# Patient Record
Sex: Female | Born: 1960
Health system: Southern US, Community
[De-identification: ages and names within clinical notes are randomized; demographics above are authoritative.]

## PROBLEM LIST (undated history)

## (undated) DIAGNOSIS — K219 Gastro-esophageal reflux disease without esophagitis: Secondary | ICD-10-CM

## (undated) DIAGNOSIS — K449 Diaphragmatic hernia without obstruction or gangrene: Secondary | ICD-10-CM

## (undated) DIAGNOSIS — K5732 Diverticulitis of large intestine without perforation or abscess without bleeding: Secondary | ICD-10-CM

## (undated) DIAGNOSIS — F329 Major depressive disorder, single episode, unspecified: Secondary | ICD-10-CM

## (undated) DIAGNOSIS — K222 Esophageal obstruction: Secondary | ICD-10-CM

## (undated) DIAGNOSIS — J309 Allergic rhinitis, unspecified: Secondary | ICD-10-CM

## (undated) DIAGNOSIS — R112 Nausea with vomiting, unspecified: Secondary | ICD-10-CM

## (undated) DIAGNOSIS — J0100 Acute maxillary sinusitis, unspecified: Secondary | ICD-10-CM

## (undated) DIAGNOSIS — F32A Depression, unspecified: Secondary | ICD-10-CM

## (undated) DIAGNOSIS — Z9889 Other specified postprocedural states: Secondary | ICD-10-CM

## (undated) DIAGNOSIS — D1803 Hemangioma of intra-abdominal structures: Secondary | ICD-10-CM

## (undated) DIAGNOSIS — K5792 Diverticulitis of intestine, part unspecified, without perforation or abscess without bleeding: Secondary | ICD-10-CM

## (undated) DIAGNOSIS — F419 Anxiety disorder, unspecified: Secondary | ICD-10-CM

## (undated) DIAGNOSIS — K5790 Diverticulosis of intestine, part unspecified, without perforation or abscess without bleeding: Secondary | ICD-10-CM

## (undated) DIAGNOSIS — T7840XA Allergy, unspecified, initial encounter: Secondary | ICD-10-CM

## (undated) DIAGNOSIS — K589 Irritable bowel syndrome without diarrhea: Secondary | ICD-10-CM

## (undated) DIAGNOSIS — H01009 Unspecified blepharitis unspecified eye, unspecified eyelid: Secondary | ICD-10-CM

## (undated) HISTORY — DX: Diaphragmatic hernia without obstruction or gangrene: K44.9

## (undated) HISTORY — DX: Major depressive disorder, single episode, unspecified: F32.9

## (undated) HISTORY — PX: ROBOT ASSISTED LAPAROSCOPIC PARTIAL COLECTOMY: SHX6476

## (undated) HISTORY — DX: Hemangioma of intra-abdominal structures: D18.03

## (undated) HISTORY — DX: Gastro-esophageal reflux disease without esophagitis: K21.9

## (undated) HISTORY — DX: Allergic rhinitis, unspecified: J30.9

## (undated) HISTORY — DX: Acute maxillary sinusitis, unspecified: J01.00

## (undated) HISTORY — DX: Irritable bowel syndrome, unspecified: K58.9

## (undated) HISTORY — DX: Diverticulitis of large intestine without perforation or abscess without bleeding: K57.32

## (undated) HISTORY — DX: Anxiety disorder, unspecified: F41.9

## (undated) HISTORY — DX: Allergy, unspecified, initial encounter: T78.40XA

## (undated) HISTORY — DX: Depression, unspecified: F32.A

## (undated) HISTORY — PX: UPPER GASTROINTESTINAL ENDOSCOPY: SHX188

## (undated) HISTORY — DX: Diverticulitis of intestine, part unspecified, without perforation or abscess without bleeding: K57.92

## (undated) HISTORY — DX: Diverticulosis of intestine, part unspecified, without perforation or abscess without bleeding: K57.90

## (undated) HISTORY — PX: OTHER SURGICAL HISTORY: SHX169

## (undated) HISTORY — DX: Esophageal obstruction: K22.2

## (undated) HISTORY — PX: COLONOSCOPY: SHX174

## (undated) HISTORY — PX: TONSILLECTOMY: SUR1361

## (undated) HISTORY — DX: Unspecified blepharitis unspecified eye, unspecified eyelid: H01.009

## (undated) HISTORY — PX: NASAL SINUS SURGERY: SHX719

---

## 2001-03-08 ENCOUNTER — Encounter (INDEPENDENT_AMBULATORY_CARE_PROVIDER_SITE_OTHER): Payer: Self-pay | Admitting: Specialist

## 2001-03-08 ENCOUNTER — Other Ambulatory Visit: Admission: RE | Admit: 2001-03-08 | Discharge: 2001-03-08 | Payer: Self-pay | Admitting: Gastroenterology

## 2001-12-10 ENCOUNTER — Encounter: Payer: Self-pay | Admitting: Internal Medicine

## 2001-12-10 ENCOUNTER — Ambulatory Visit (HOSPITAL_COMMUNITY): Admission: RE | Admit: 2001-12-10 | Discharge: 2001-12-10 | Payer: Self-pay | Admitting: Internal Medicine

## 2003-03-23 ENCOUNTER — Encounter: Payer: Self-pay | Admitting: Obstetrics and Gynecology

## 2003-03-23 ENCOUNTER — Ambulatory Visit (HOSPITAL_COMMUNITY): Admission: RE | Admit: 2003-03-23 | Discharge: 2003-03-23 | Payer: Self-pay | Admitting: Obstetrics and Gynecology

## 2004-02-12 ENCOUNTER — Encounter: Admission: RE | Admit: 2004-02-12 | Discharge: 2004-02-12 | Payer: Self-pay | Admitting: Family Medicine

## 2004-04-07 ENCOUNTER — Encounter: Admission: RE | Admit: 2004-04-07 | Discharge: 2004-04-07 | Payer: Self-pay | Admitting: Gastroenterology

## 2004-04-13 ENCOUNTER — Ambulatory Visit (HOSPITAL_COMMUNITY): Admission: RE | Admit: 2004-04-13 | Discharge: 2004-04-13 | Payer: Self-pay | Admitting: Gastroenterology

## 2004-04-13 ENCOUNTER — Encounter: Payer: Self-pay | Admitting: Internal Medicine

## 2004-04-19 ENCOUNTER — Encounter (INDEPENDENT_AMBULATORY_CARE_PROVIDER_SITE_OTHER): Payer: Self-pay | Admitting: Gastroenterology

## 2004-04-26 ENCOUNTER — Ambulatory Visit (HOSPITAL_COMMUNITY): Admission: RE | Admit: 2004-04-26 | Discharge: 2004-04-26 | Payer: Self-pay | Admitting: Gastroenterology

## 2004-07-28 ENCOUNTER — Ambulatory Visit: Payer: Self-pay | Admitting: Internal Medicine

## 2004-09-02 ENCOUNTER — Ambulatory Visit: Payer: Self-pay | Admitting: Internal Medicine

## 2005-05-22 ENCOUNTER — Ambulatory Visit: Payer: Self-pay | Admitting: Gastroenterology

## 2005-09-14 ENCOUNTER — Ambulatory Visit: Payer: Self-pay | Admitting: Internal Medicine

## 2005-09-19 ENCOUNTER — Ambulatory Visit: Payer: Self-pay | Admitting: Internal Medicine

## 2005-10-05 ENCOUNTER — Ambulatory Visit: Payer: Self-pay | Admitting: Internal Medicine

## 2005-11-07 ENCOUNTER — Encounter: Payer: Self-pay | Admitting: Internal Medicine

## 2005-11-10 IMAGING — CT CT ABDOMEN W/ CM
1 of 2 series · 15 of 32 positions shown, 19 images · IV contrast (gastro & omnipaque 100)
Comparison: none

CLINICAL DATA: Follow-up abdominal mass, [HOSPITAL] study abdominal CT with contrast, [DATE].  
ABDOMINAL CT WITH CONTRAST

[Series 2: routine abdomen · axial · 0.70mm/px · z∈[-394,-49]mm · 15 of 77 slices shown, 19 images]
[im 4/77  soft-tissue]
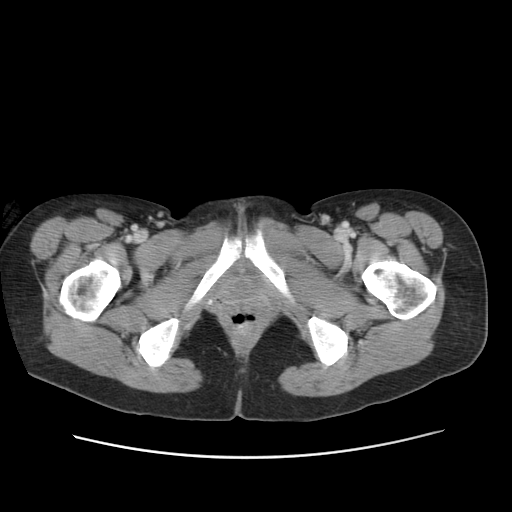
[im 4/77  bone]
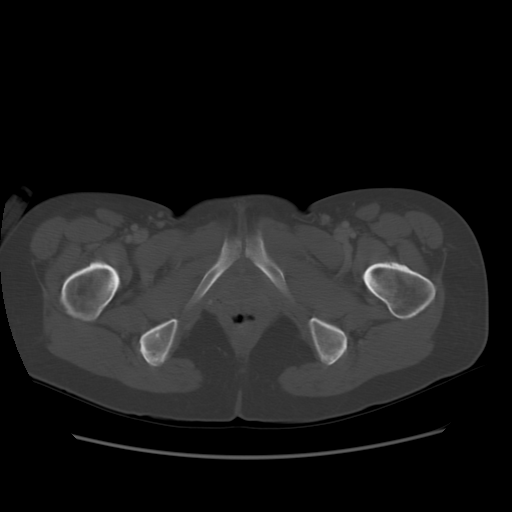
[im 11/77  soft-tissue]
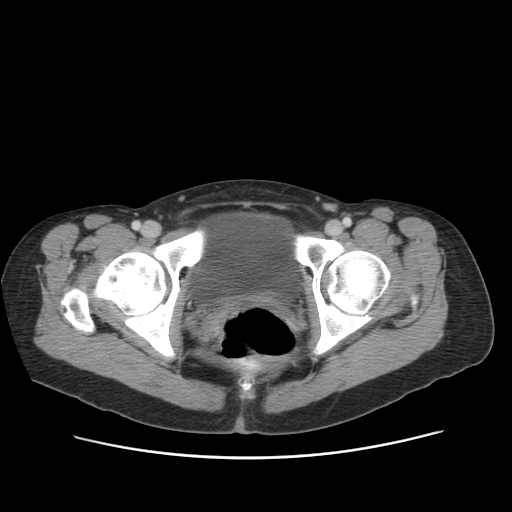
[im 18/77  soft-tissue]
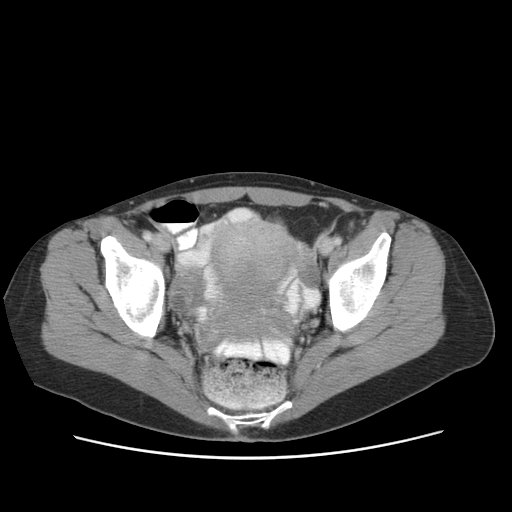
[im 21/77  soft-tissue]
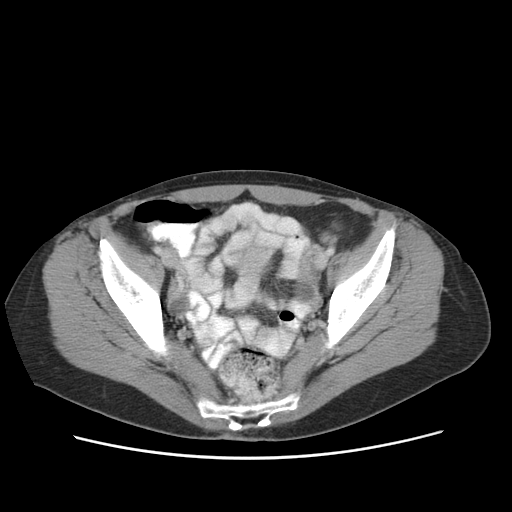
[im 28/77  soft-tissue]
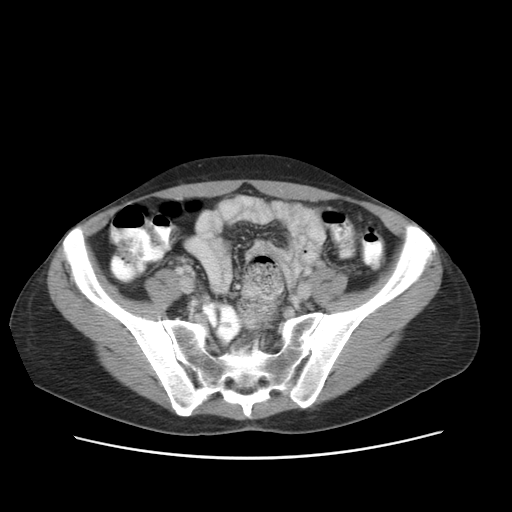
[im 32/77  soft-tissue]
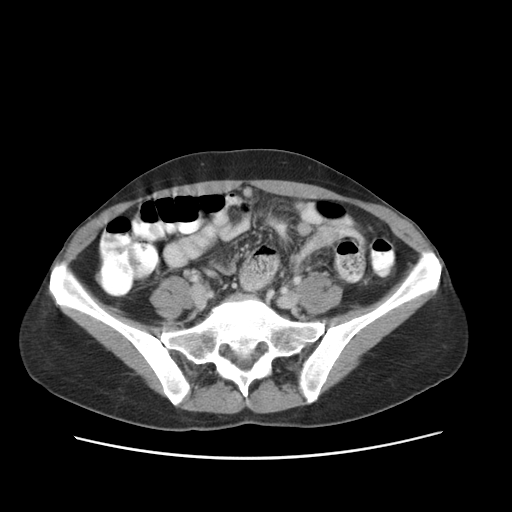
[im 39/77  soft-tissue]
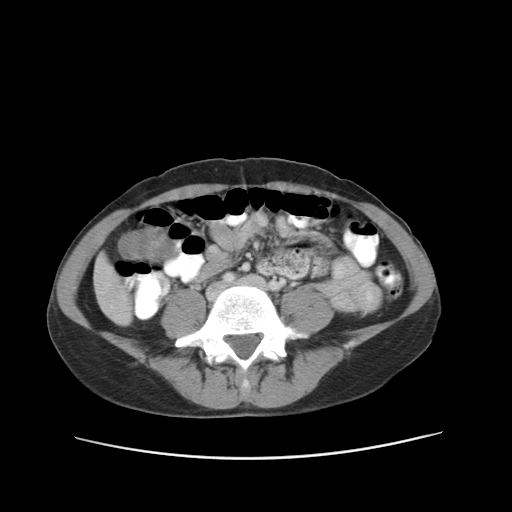
[im 45/77  soft-tissue]
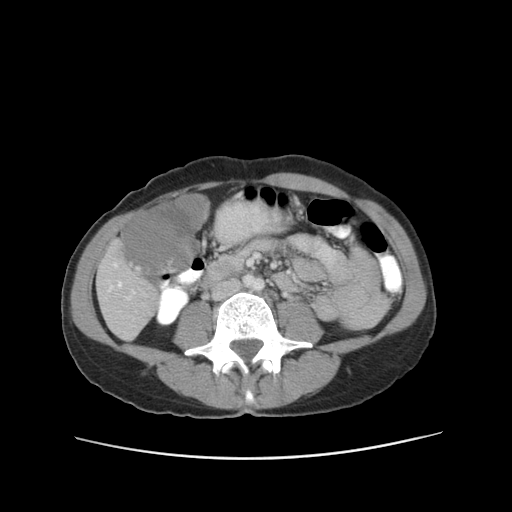
[im 49/77  soft-tissue]
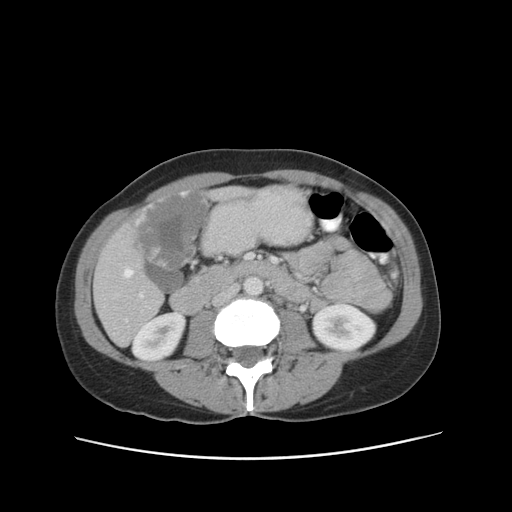
[im 49/77  bone]
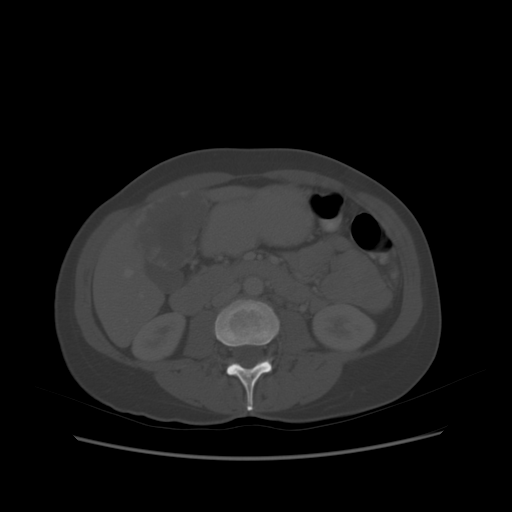
[im 56/77  soft-tissue]
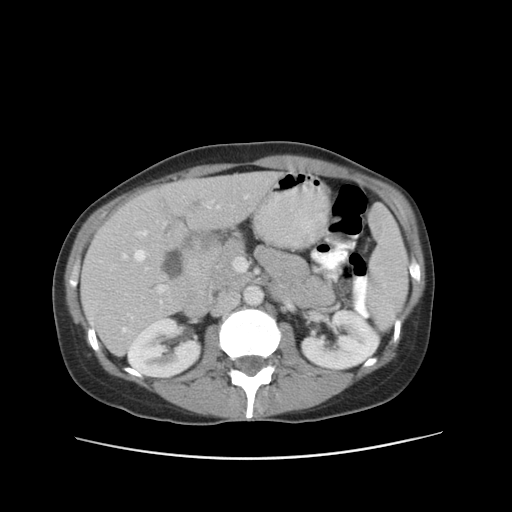
[im 59/77  soft-tissue]
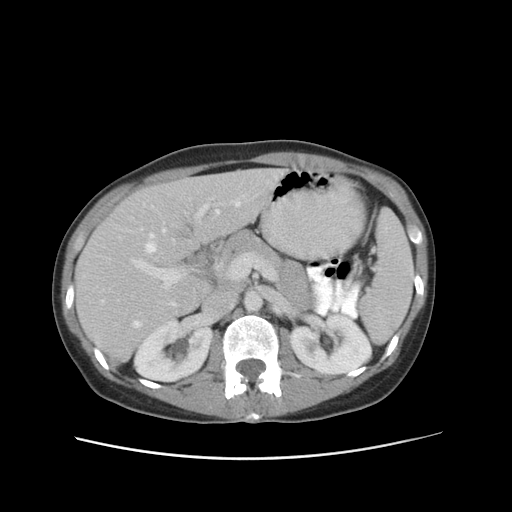
[im 63/77  lung]
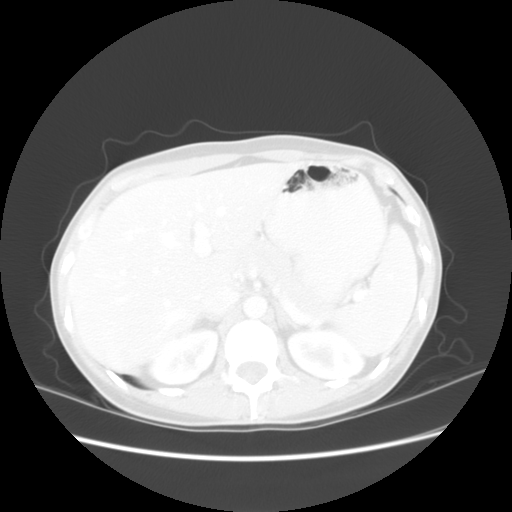
[im 66/77  soft-tissue]
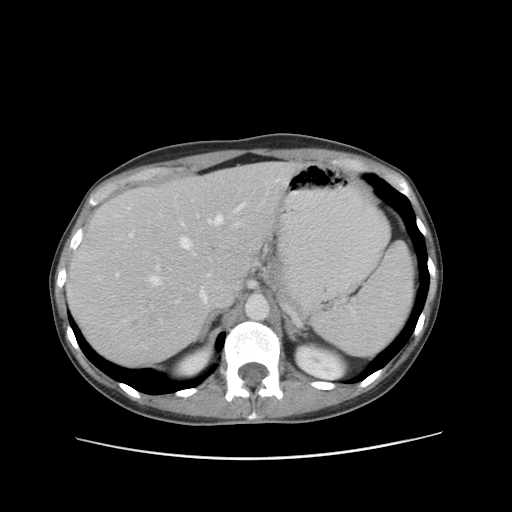
[im 66/77  lung]
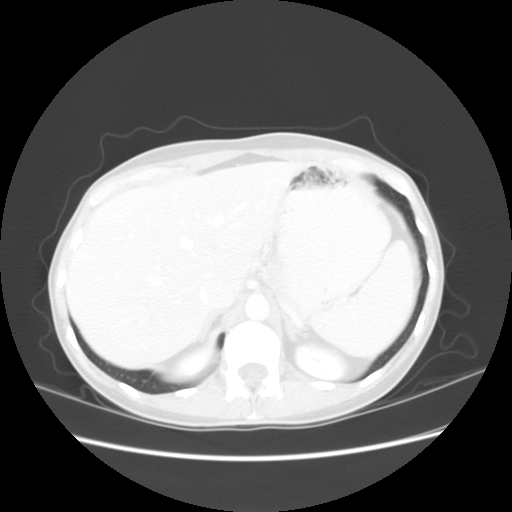
[im 70/77  lung]
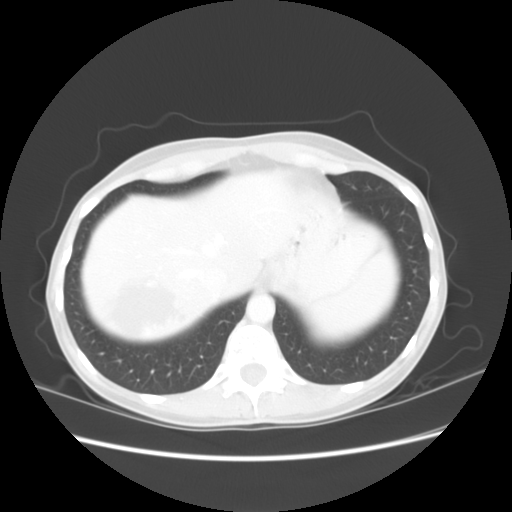
[im 73/77  soft-tissue]
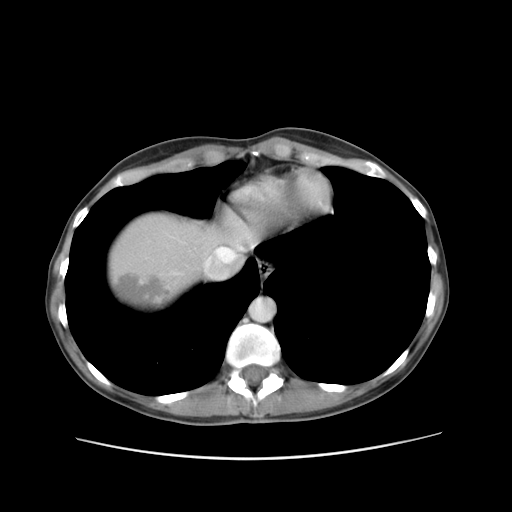
[im 73/77  lung]
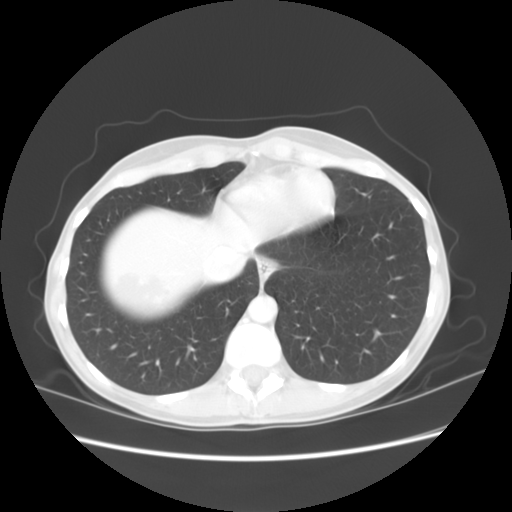

[15 of 32 positions shown; findings below may reference images not displayed]

FINDINGS: Previous [place] abdominal ultrasound is unavailable for comparison at this time.  When available an addendum report will be issued.  4 cm long x 3.6 cm AP x 4.8 cm wide dome of liver and inferomedial segment left lobe 9 cm long x 4.8 cm AP x 7.1 cm wide (image 31) and superior left lobe 6 mm and 6 mm more inferior mid probable right lobe (image 16) low density foci are seen on dynamic and delayed intravenous contrast enhanced images.  he two larger lesions show progressive peripheral vascular enhancement with the more superior left lobe 6 mm focus appearing isodense favoring three liver hemangiomata. The smaller 6 mm focus on delayed views (image 18) persists favoring incidental cyst although indeterminate due to small size at this time.  The liver is otherwise unremarkable.  Bases of the lungs are clear.  The remaining abdominal organs appear normal without inflammation nor adenopathy.
IMPRESSION
1.  Four hepatic lesions as described.  Three of which favor hemangiomata.  Follow-up with liver ultrasound or CT is advised in six months or liver MRI for further confirmation.
2.  Probable tiny right hepatic cyst ? follow-up ultrasound or CT also would be confirmatory.
3.  Otherwise negative.
PELVIC CT WITH CONTRAST
FINDINGS: Routine spiral CT of the pelvis was performed. Omnipaque 300 intravenous contrast and oral contrast were administered. 

The pelvic structures are normal in appearance. There is no evidence of masses, adenopathy, inflammatory process, or abnormal fluid collections.   Small bilateral ovarian cysts are seen with the largest at the left measuring up to 2.2 cm (image 56). 

IMPRESSION
1.    Bilateral ovarian cysts as described.
2.     Otherwise negative.

## 2005-12-09 ENCOUNTER — Ambulatory Visit: Payer: Self-pay | Admitting: Internal Medicine

## 2006-02-06 ENCOUNTER — Ambulatory Visit: Payer: Self-pay | Admitting: Internal Medicine

## 2006-08-15 ENCOUNTER — Ambulatory Visit: Payer: Self-pay | Admitting: Gastroenterology

## 2006-08-15 LAB — CONVERTED CEMR LAB
ALT: 15 units/L (ref 0–40)
AST: 22 units/L (ref 0–37)
Albumin: 4.5 g/dL (ref 3.5–5.2)
Alkaline Phosphatase: 43 units/L (ref 39–117)
Amylase: 164 units/L — ABNORMAL HIGH (ref 27–131)
BUN: 10 mg/dL (ref 6–23)
Basophils Absolute: 0 10*3/uL (ref 0.0–0.1)
Basophils Relative: 0.7 % (ref 0.0–1.0)
CO2: 29 meq/L (ref 19–32)
Calcium: 9.8 mg/dL (ref 8.4–10.5)
Chloride: 103 meq/L (ref 96–112)
Creatinine, Ser: 0.8 mg/dL (ref 0.4–1.2)
Eosinophils Absolute: 0.1 10*3/uL (ref 0.0–0.6)
Eosinophils Relative: 1.5 % (ref 0.0–5.0)
GFR calc Af Amer: 100 mL/min
GFR calc non Af Amer: 82 mL/min
Glucose, Bld: 91 mg/dL (ref 70–99)
HCT: 38.8 % (ref 36.0–46.0)
Hemoglobin: 13.7 g/dL (ref 12.0–15.0)
Lipase: 39 units/L (ref 11.0–59.0)
Lymphocytes Relative: 19.4 % (ref 12.0–46.0)
MCHC: 35.3 g/dL (ref 30.0–36.0)
MCV: 92.9 fL (ref 78.0–100.0)
Monocytes Absolute: 0.5 10*3/uL (ref 0.2–0.7)
Monocytes Relative: 7.4 % (ref 3.0–11.0)
Neutro Abs: 5.1 10*3/uL (ref 1.4–7.7)
Neutrophils Relative %: 71 % (ref 43.0–77.0)
Platelets: 215 10*3/uL (ref 150–400)
Potassium: 4.3 meq/L (ref 3.5–5.1)
RBC: 4.18 M/uL (ref 3.87–5.11)
RDW: 11.6 % (ref 11.5–14.6)
Sed Rate: 11 mm/hr (ref 0–25)
Sodium: 137 meq/L (ref 135–145)
Total Bilirubin: 0.8 mg/dL (ref 0.3–1.2)
Total Protein: 7.4 g/dL (ref 6.0–8.3)
WBC: 7.1 10*3/uL (ref 4.5–10.5)

## 2006-08-17 ENCOUNTER — Ambulatory Visit (HOSPITAL_COMMUNITY): Admission: RE | Admit: 2006-08-17 | Discharge: 2006-08-17 | Payer: Self-pay | Admitting: Gastroenterology

## 2006-08-20 ENCOUNTER — Ambulatory Visit (HOSPITAL_COMMUNITY): Admission: RE | Admit: 2006-08-20 | Discharge: 2006-08-20 | Payer: Self-pay | Admitting: Gastroenterology

## 2006-08-23 ENCOUNTER — Ambulatory Visit: Payer: Self-pay | Admitting: Gastroenterology

## 2006-08-24 ENCOUNTER — Ambulatory Visit (HOSPITAL_COMMUNITY): Admission: RE | Admit: 2006-08-24 | Discharge: 2006-08-24 | Payer: Self-pay | Admitting: Gastroenterology

## 2006-09-11 ENCOUNTER — Ambulatory Visit: Payer: Self-pay | Admitting: Internal Medicine

## 2006-09-20 ENCOUNTER — Ambulatory Visit: Payer: Self-pay | Admitting: Internal Medicine

## 2006-09-28 ENCOUNTER — Ambulatory Visit: Payer: Self-pay | Admitting: Internal Medicine

## 2006-10-03 ENCOUNTER — Ambulatory Visit: Payer: Self-pay | Admitting: Gastroenterology

## 2006-11-12 ENCOUNTER — Ambulatory Visit: Payer: Self-pay | Admitting: Internal Medicine

## 2006-11-12 LAB — CONVERTED CEMR LAB: Creatinine, Ser: 0.7 mg/dL (ref 0.4–1.2)

## 2006-11-20 ENCOUNTER — Encounter: Payer: Self-pay | Admitting: Internal Medicine

## 2007-05-27 ENCOUNTER — Ambulatory Visit: Payer: Self-pay | Admitting: Internal Medicine

## 2007-05-27 DIAGNOSIS — J019 Acute sinusitis, unspecified: Secondary | ICD-10-CM | POA: Insufficient documentation

## 2007-05-27 DIAGNOSIS — Z87442 Personal history of urinary calculi: Secondary | ICD-10-CM

## 2007-05-27 DIAGNOSIS — H01009 Unspecified blepharitis unspecified eye, unspecified eyelid: Secondary | ICD-10-CM | POA: Insufficient documentation

## 2007-05-27 DIAGNOSIS — K449 Diaphragmatic hernia without obstruction or gangrene: Secondary | ICD-10-CM | POA: Insufficient documentation

## 2007-05-27 DIAGNOSIS — J309 Allergic rhinitis, unspecified: Secondary | ICD-10-CM

## 2007-05-27 DIAGNOSIS — K219 Gastro-esophageal reflux disease without esophagitis: Secondary | ICD-10-CM | POA: Insufficient documentation

## 2007-05-27 HISTORY — DX: Unspecified blepharitis unspecified eye, unspecified eyelid: H01.009

## 2007-05-30 ENCOUNTER — Telehealth: Payer: Self-pay | Admitting: Internal Medicine

## 2007-07-04 ENCOUNTER — Telehealth: Payer: Self-pay | Admitting: Internal Medicine

## 2007-07-31 ENCOUNTER — Telehealth: Payer: Self-pay | Admitting: Internal Medicine

## 2007-08-26 ENCOUNTER — Telehealth: Payer: Self-pay | Admitting: Internal Medicine

## 2007-09-27 ENCOUNTER — Ambulatory Visit: Payer: Self-pay | Admitting: Internal Medicine

## 2008-01-08 ENCOUNTER — Ambulatory Visit: Payer: Self-pay | Admitting: Internal Medicine

## 2008-01-08 DIAGNOSIS — L255 Unspecified contact dermatitis due to plants, except food: Secondary | ICD-10-CM

## 2008-02-06 ENCOUNTER — Encounter: Payer: Self-pay | Admitting: Internal Medicine

## 2008-02-10 ENCOUNTER — Telehealth (INDEPENDENT_AMBULATORY_CARE_PROVIDER_SITE_OTHER): Payer: Self-pay | Admitting: Gastroenterology

## 2008-02-17 ENCOUNTER — Encounter: Payer: Self-pay | Admitting: Internal Medicine

## 2008-05-18 ENCOUNTER — Telehealth: Payer: Self-pay | Admitting: *Deleted

## 2008-08-11 ENCOUNTER — Telehealth: Payer: Self-pay | Admitting: *Deleted

## 2008-09-08 ENCOUNTER — Ambulatory Visit: Payer: Self-pay | Admitting: Internal Medicine

## 2008-11-11 ENCOUNTER — Encounter: Payer: Self-pay | Admitting: Internal Medicine

## 2008-11-30 ENCOUNTER — Telehealth: Payer: Self-pay | Admitting: *Deleted

## 2008-12-22 ENCOUNTER — Ambulatory Visit: Payer: Self-pay | Admitting: Internal Medicine

## 2008-12-22 DIAGNOSIS — D1803 Hemangioma of intra-abdominal structures: Secondary | ICD-10-CM

## 2008-12-22 DIAGNOSIS — F4321 Adjustment disorder with depressed mood: Secondary | ICD-10-CM

## 2008-12-22 DIAGNOSIS — G479 Sleep disorder, unspecified: Secondary | ICD-10-CM | POA: Insufficient documentation

## 2009-01-01 ENCOUNTER — Ambulatory Visit: Payer: Self-pay | Admitting: Internal Medicine

## 2009-02-26 ENCOUNTER — Telehealth: Payer: Self-pay | Admitting: *Deleted

## 2009-09-03 ENCOUNTER — Telehealth: Payer: Self-pay | Admitting: Internal Medicine

## 2009-11-15 ENCOUNTER — Telehealth: Payer: Self-pay | Admitting: Internal Medicine

## 2009-11-15 ENCOUNTER — Ambulatory Visit: Payer: Self-pay | Admitting: Internal Medicine

## 2009-11-15 DIAGNOSIS — K222 Esophageal obstruction: Secondary | ICD-10-CM

## 2009-11-15 HISTORY — DX: Esophageal obstruction: K22.2

## 2009-11-16 LAB — CONVERTED CEMR LAB
Basophils Absolute: 0 10*3/uL (ref 0.0–0.1)
Basophils Relative: 0.2 % (ref 0.0–3.0)
Eosinophils Absolute: 0 10*3/uL (ref 0.0–0.7)
Eosinophils Relative: 0.5 % (ref 0.0–5.0)
HCT: 36.3 % (ref 36.0–46.0)
Hemoglobin: 12.5 g/dL (ref 12.0–15.0)
Lymphocytes Relative: 10.9 % — ABNORMAL LOW (ref 12.0–46.0)
Lymphs Abs: 1.1 10*3/uL (ref 0.7–4.0)
MCHC: 34.5 g/dL (ref 30.0–36.0)
MCV: 92.5 fL (ref 78.0–100.0)
Monocytes Absolute: 0.7 10*3/uL (ref 0.1–1.0)
Monocytes Relative: 7.6 % (ref 3.0–12.0)
Neutro Abs: 7.9 10*3/uL — ABNORMAL HIGH (ref 1.4–7.7)
Neutrophils Relative %: 80.8 % — ABNORMAL HIGH (ref 43.0–77.0)
Platelets: 183 10*3/uL (ref 150.0–400.0)
RBC: 3.92 M/uL (ref 3.87–5.11)
RDW: 13.8 % (ref 11.5–14.6)
WBC: 9.8 10*3/uL (ref 4.5–10.5)

## 2009-11-17 ENCOUNTER — Telehealth: Payer: Self-pay | Admitting: Physician Assistant

## 2009-12-13 ENCOUNTER — Encounter: Payer: Self-pay | Admitting: Internal Medicine

## 2010-01-13 ENCOUNTER — Inpatient Hospital Stay (HOSPITAL_COMMUNITY): Admission: EM | Admit: 2010-01-13 | Discharge: 2010-01-14 | Payer: Self-pay | Admitting: Emergency Medicine

## 2010-01-13 ENCOUNTER — Telehealth: Payer: Self-pay | Admitting: Internal Medicine

## 2010-01-17 ENCOUNTER — Telehealth: Payer: Self-pay | Admitting: Internal Medicine

## 2010-01-20 ENCOUNTER — Encounter: Payer: Self-pay | Admitting: Internal Medicine

## 2010-01-27 ENCOUNTER — Ambulatory Visit: Payer: Self-pay | Admitting: Internal Medicine

## 2010-03-22 ENCOUNTER — Ambulatory Visit: Payer: Self-pay | Admitting: Internal Medicine

## 2010-03-22 LAB — CONVERTED CEMR LAB
ALT: 11 units/L (ref 0–35)
AST: 16 units/L (ref 0–37)
Albumin: 4.2 g/dL (ref 3.5–5.2)
Alkaline Phosphatase: 43 units/L (ref 39–117)
BUN: 13 mg/dL (ref 6–23)
Basophils Absolute: 0 10*3/uL (ref 0.0–0.1)
Basophils Relative: 0.3 % (ref 0.0–3.0)
Bilirubin Urine: NEGATIVE
Bilirubin, Direct: 0.1 mg/dL (ref 0.0–0.3)
CO2: 29 meq/L (ref 19–32)
Calcium: 9 mg/dL (ref 8.4–10.5)
Chloride: 105 meq/L (ref 96–112)
Cholesterol: 167 mg/dL (ref 0–200)
Creatinine, Ser: 0.8 mg/dL (ref 0.4–1.2)
Eosinophils Absolute: 0.1 10*3/uL (ref 0.0–0.7)
Eosinophils Relative: 2.1 % (ref 0.0–5.0)
GFR calc non Af Amer: 85.78 mL/min (ref >60)
Glucose, Bld: 76 mg/dL (ref 70–99)
Glucose, Urine, Semiquant: NEGATIVE
HCT: 36 % (ref 36.0–46.0)
HDL: 48.6 mg/dL (ref >39.00)
Hemoglobin: 12.4 g/dL (ref 12.0–15.0)
Ketones, urine, test strip: NEGATIVE
LDL Cholesterol: 96 mg/dL (ref 0–99)
Lymphocytes Relative: 24.5 % (ref 12.0–46.0)
Lymphs Abs: 1.2 10*3/uL (ref 0.7–4.0)
MCHC: 34.6 g/dL (ref 30.0–36.0)
MCV: 95.2 fL (ref 78.0–100.0)
Monocytes Absolute: 0.3 10*3/uL (ref 0.1–1.0)
Monocytes Relative: 6.5 % (ref 3.0–12.0)
Neutro Abs: 3.1 10*3/uL (ref 1.4–7.7)
Neutrophils Relative %: 66.6 % (ref 43.0–77.0)
Nitrite: NEGATIVE
Platelets: 193 10*3/uL (ref 150.0–400.0)
Potassium: 4.7 meq/L (ref 3.5–5.1)
Protein, U semiquant: NEGATIVE
RBC: 3.78 M/uL — ABNORMAL LOW (ref 3.87–5.11)
RDW: 14 % (ref 11.5–14.6)
Sodium: 140 meq/L (ref 135–145)
Specific Gravity, Urine: 1.025
TSH: 4.67 microintl units/mL (ref 0.35–5.50)
Total Bilirubin: 0.6 mg/dL (ref 0.3–1.2)
Total CHOL/HDL Ratio: 3
Total Protein: 6.2 g/dL (ref 6.0–8.3)
Triglycerides: 110 mg/dL (ref 0.0–149.0)
Urobilinogen, UA: 0.2
VLDL: 22 mg/dL (ref 0.0–40.0)
WBC Urine, dipstick: NEGATIVE
WBC: 4.7 10*3/uL (ref 4.5–10.5)
pH: 5.5

## 2010-04-08 ENCOUNTER — Telehealth: Payer: Self-pay | Admitting: Internal Medicine

## 2010-07-21 ENCOUNTER — Ambulatory Visit
Admission: RE | Admit: 2010-07-21 | Discharge: 2010-07-21 | Payer: Self-pay | Source: Home / Self Care | Attending: Internal Medicine | Admitting: Internal Medicine

## 2010-07-21 DIAGNOSIS — N63 Unspecified lump in unspecified breast: Secondary | ICD-10-CM | POA: Insufficient documentation

## 2010-07-26 ENCOUNTER — Encounter: Payer: Self-pay | Admitting: Internal Medicine

## 2010-07-29 ENCOUNTER — Telehealth: Payer: Self-pay | Admitting: *Deleted

## 2010-08-14 ENCOUNTER — Encounter: Payer: Self-pay | Admitting: Internal Medicine

## 2010-08-23 NOTE — Assessment & Plan Note (Signed)
Summary: LLQ pain/sheri    History of Present Illness Visit Type: Follow-up Visit Primary GI MD: Stan Head MD [facility] Primary Provider: Berniece Andreas, MD Chief Complaint: LLQ, severe at times for 3 days History of Present Illness:   50 YO FEMALE KNOWN TO DR. Leone Payor WITH HX OF RECURRENT DIVERTICULITIS. HER LAST COLONOSCOPY WAS DONE IN 6/10 AND SHOWS MODERATEDIVERTICULOSIS TRANSVERSE TO SIGMOID COLON -MORE INVOLVED IN DESCENDING AND SIGMOID . SHE HAS BEEN TREATED MULTIPLE TIMES FOR DIVERTICULITIS-HAS NOT REQUIRED HOSPITALIZATION. HAS HAD 4 DOCUMENTED EPISODES IN THE PAST YEAR AND SAYS SHE TREATED HERSELF WITH CIPRO AT LEAST ONE OTHER TIME. SHE COMES IN TODAY WITH 3 DAY HX OF SIGNIFICANT LLQ PAIN WITH SOME RADIATION INTO THE LOWER BACK AND INTO HER LEGS.. NO FEVER,+CHILLS,MILD NAUSEA. SHE FEELS WORSE WITH by mouth INTAKE. DID NOT SLEEP LAST NIGHT DUE TO PAIN. SHE IS HAVING NORMAL BM'S., NO HEME. SHE HAS NOT HAD A SURGICAL CONSULT THOUGH SURGERY HAS BEEN DISCUSSED.   GI Review of Systems    Reports abdominal pain and  bloating.     Location of  Abdominal pain: LLQ.    Denies acid reflux, belching, chest pain, dysphagia with liquids, dysphagia with solids, heartburn, loss of appetite, vomiting, vomiting blood, and  weight loss.      Reports diverticulosis.     Denies anal fissure, black tarry stools, change in bowel habit, constipation, diarrhea, fecal incontinence, heme positive stool, hemorrhoids, irritable bowel syndrome, jaundice, light color stool, liver problems, rectal bleeding, and  rectal pain.    Current Medications (verified): 1)  Wellbutrin Xl 150 Mg  Tb24 (Bupropion Hcl) .Marland Kitchen.. 1 By Mouth Once Daily 2)  Calcium 500/d 500-200 Mg-Unit Tabs (Calcium Carbonate-Vitamin D) .... Once Daily 3)  Alprazolam 0.25 Mg Tabs (Alprazolam) .Marland Kitchen.. 1 By Mouth At Night As Needed 4)  Sulfacetamide Sodium (Acne) 10 % Lotn (Sulfacetamide Sodium (Acne)) .... Apply To Affected Area Two Times A Day 5)   Multivitamins  Tabs (Multiple Vitamin) .... Once Daily 6)  Vitamin D 400 Unit Tabs (Cholecalciferol) .... Once Daily  Allergies (verified): 1)  ! Demerol 2)  ! Augmentin  Past History:  Past Medical History: Allergic rhinitis GERD hemangioma liver incidental finding -LARGE;RIGHT AND LEFT LOBES OF LIVER. Anxiety Disorder Depression Irritable Bowel Syndrome Diverticulosis/Diverticulitis-RECURRENT Hiatal Hernia  Past Surgical History: Reviewed history from 05/27/2007 and no changes required. Sinus/ Nasal Surgery Ulnar nerve rt arm Tonsillectomy Adnoidectomy Endoscopy w/ dilation  Family History: Reviewed history from 09/08/2008 and no changes required. son with autism  Lung cancer: Mother Family History of Colon Cancer:Maternal Uncle Family History of Heart Disease: Father  Social History: Reviewed history from 12/22/2008 and no changes required. Married Never Smoked   Occupation:Home  Alcohol Use - yes -occ. Daily Caffeine Use -3 Illicit Drug Use - no Patient gets regular exercise. husband works in [place] and commutes   Review of Systems       The patient complains of allergy/sinus, back pain, night sweats, and skin rash.  The patient denies anemia, anxiety-new, arthritis/joint pain, blood in urine, breast changes/lumps, change in vision, confusion, cough, coughing up blood, depression-new, fainting, fatigue, fever, headaches-new, hearing problems, heart murmur, heart rhythm changes, itching, menstrual pain, muscle pains/cramps, nosebleeds, pregnancy symptoms, shortness of breath, sleeping problems, sore throat, swelling of feet/legs, swollen lymph glands, thirst - excessive , urination - excessive , urination changes/pain, urine leakage, vision changes, and voice change.         ROS OTHERWISE AS IN HPI  Vital Signs:  Patient profile:   50 year old female Menstrual status:  irregular Height:      66 inches Weight:      112.13 pounds BMI:     18.16 Temp:     98.5  degrees F oral Pulse rate:   100 / minute Pulse rhythm:   regular BP sitting:   100 / 64  (left arm) Cuff size:   regular  Vitals Entered By: June McMurray CMA Duncan Dull) (November 15, 2009 2:01 PM)  Physical Exam  General:  Well developed, well nourished, no acute distress. Head:  Normocephalic and atraumatic. Eyes:  PERRLA, no icterus. Lungs:  Clear throughout to auscultation. Heart:  Regular rate and rhythm; no murmurs, rubs,  or bruits. Abdomen:  SOFT, QIUTE TENDER LLQ,NO REBOUND, BS+ Rectal:  NOT DONE Extremities:  No clubbing, cyanosis, edema or deformities noted. Neurologic:  Alert and  oriented x4;  grossly normal neurologically. Psych:  Alert and cooperative. Normal mood and affect.   Impression & Recommendations:  Problem # 1:  DIVERTICULITIS OF COLON, RECURRENT (ICD-562.11) Assessment Deteriorated 50 Y.O. FEMALE WITH RECURRENT DIVERTICULITIS.  CBC TODAY START CIPRO 500 MG TWICE DAILY X 14 DAYS DOES NOT TOLERATE FLAGYL-NAUSEA BENTYL 10 MG 2-3 X DAILY AS NEEDED OVER THE NEXT FEW DAYS VICODEN 5/500 Q 4-6 HOURS AS NEEDED FOR PAIN WILL SCHEDULE FOR SURGICAL  CONSULT  TO DISCUSS ELECTIVE SEGMENTAL COLECTOMY. PT ADVISED TO CALL FOR ANY WORSENING OF SXS. Orders: TLB-CBC Platelet - w/Differential ([id number])  Problem # 2:  FAMILY HX COLON CANCER (ICD-V16.0) Assessment: Comment Only UNCLE; PT HAD COLONOSCOPY IN 6/10-DUE FOR FOLLOW UP  IN 5 YEARS  Problem # 3:  HEMANGIOMA, HEPATIC (ICD-228.04) Assessment: Comment Only  Problem # 4:  Hx of ADJUSTMENT DISORDER WITH DEPRESSED MOOD (ICD-309.0) Assessment: Comment Only  Patient Instructions: 1)  We have sent electronically a perscription for Bentyl and Cipro to [facility] [address]. 2)  We have faxed a perscription for pain, Vicodin to your pharmacy. 3)  We have called [facility] to make you a surgical consultation appointment, Nov 26, 2009. Arrive at 10:00Am for a 10:20 Appt. You will see Dr. Star Age.  4)   Copy sent to : Berniece Andreas, MD 5)  The medication list was reviewed and reconciled.  All changed / newly prescribed medications were explained.  A complete medication list was provided to the patient / caregiver. Prescriptions: VICODIN 5-500 MG TABS (HYDROCODONE-ACETAMINOPHEN) Take 1 tab every 6-8 hours as needed for pain  #10 x 0   Entered by:   Lowry Ram NCMA   Authorized by:   Sammuel Cooper PA-c   Signed by:   Lowry Ram NCMA on 11/15/2009   Method used:   Printed then faxed to ...       [facility]  [address] [phone]* (retail)       [address]       [place], [place]  [address]       Ph: [phone]       Fax: [phone]   RxID:   [id number] BENTYL 10 MG CAPS (DICYCLOMINE HCL) Take 1 tab twice daily as needed for cramping and spasms  #20 x 0   Entered by:   Lowry Ram NCMA   Authorized by:   Sammuel Cooper PA-c   Signed by:   Lowry Ram NCMA on 11/15/2009   Method used:   Electronically to        [facility] [address] [phone]* (retail)  [address]       [place], [place]  [address]       Ph: [phone]       Fax: [phone]   RxID:   [id number] CIPRO 500 MG TABS (CIPROFLOXACIN HCL) Take 1 tab twice daily x 14 days  #28 x 0   Entered by:   Lowry Ram NCMA   Authorized by:   Sammuel Cooper PA-c   Signed by:   Lowry Ram NCMA on 11/15/2009   Method used:   Electronically to        [facility] [address] [address] [phone]* (retail)       [address]       [place], [place]  [address]       Ph: [phone]       Fax: [phone]   RxID:   [id number]

## 2010-08-23 NOTE — Progress Notes (Signed)
Summary: med concern   Phone Note Call from Patient Call back at Home Phone [phone] Call back at [phone]   Caller: Patient Call For: Dr. Leone Payor Reason for Call: Talk to Nurse Summary of Call: would like a rx for Cipro... [facility] [address]...  also would like to discuss Diverticulitis with someone before the weekend Initial call taken by: Vallarie Mare,  April 08, 2010 8:15 AM  Follow-up for Phone Call        Patient  not currently having any diverticulitis problems.  She has met with Dr Michaell Cowing about surgery, but her husband has been diagnosed with Prostate CA and is undergoing treatment.  She has had to postpone her surgery until January.  She is leaving town on [place] and is requesting a rx to take with her incase she has another flare.  She also wanted Dr Leone Payor to know that last week she had a flare and just finished the last rx of Cipro.  Please advise Follow-up by: Darcey Nora RN, CGRN,  April 08, 2010 8:59 AM  Additional Follow-up for Phone Call Additional follow up Details #1::        Cipro refilled she should still let us know if having problems that are not responding to cipro Additional Follow-up by: Iva Boop MD, Clementeen Graham,  April 08, 2010 10:19 AM    Additional Follow-up for Phone Call Additional follow up Details #2::    patient notified Follow-up by: Darcey Nora RN, CGRN,  April 08, 2010 10:35 AM  Prescriptions: CIPRO 500 MG TABS (CIPROFLOXACIN HCL) Take 1 tab twice daily x 14 days  #28 x 0   Entered and Authorized by:   Iva Boop MD, [facility]   Signed by:   Iva Boop MD, FACG on 04/08/2010   Method used:   Electronically to        [facility] [phone]* (retail)       [address]       [place], [place]  [address]       Ph: [phone]       Fax: [phone]   RxID:   [id number]

## 2010-08-23 NOTE — Letter (Signed)
Summary: [facility]   Imported By: Maryln Gottron 12/29/2009 13:55:26  _____________________________________________________________________  External Attachment:    Type:   Image     Comment:   External Document

## 2010-08-23 NOTE — Progress Notes (Signed)
Summary: triage   Phone Note Call from Patient Call back at Home Phone [phone]   Caller: Patient Call For: Dr. Leone Payor Reason for Call: Talk to Nurse Summary of Call: would like to be seen asap to review CT scan, discuss surgery pt is supposed to have next month for diverticulitis, and recent hospital over-night stay Initial call taken by: Vallarie Mare,  January 17, 2010 8:48 AM  Follow-up for Phone Call        Patient  was admitted last week with diverticulitis.  Since this admission her GERD symptoms have flared up.  Patient  hasn't been on a PPI.  Patient  advised to start on Priolosec OTC 1 30 minutes prior to the first meal of the day.  REV scheduled for 01/27/10 2:15 for a post hospital. Follow-up by: Darcey Nora RN, CGRN,  January 17, 2010 8:55 AM

## 2010-08-23 NOTE — Assessment & Plan Note (Signed)
Summary: post hospital diverticulitis/GERD/sheri    History of Present Illness Visit Type: Follow-up Visit Primary GI MD: Stan Head MD [facility] Primary Provider: Berniece Andreas, MD Chief Complaint: Hospital/ diverticulitis & GERD History of Present Illness:   Recently admitted for recurent diverticulitis in June. Is finishing antibiotics and pain is improving, bowel habtis normalzing. She is slowly finishing cipro and metronidazole. Some heartburn that has resolved. Some nausea with antibiotics.    GI Review of Systems    Reports acid reflux, bloating, and  nausea.      Denies abdominal pain, belching, chest pain, dysphagia with liquids, dysphagia with solids, heartburn, loss of appetite, vomiting, vomiting blood, weight loss, and  weight gain.      Reports diarrhea and  diverticulosis.     Denies anal fissure, black tarry stools, change in bowel habit, constipation, fecal incontinence, heme positive stool, hemorrhoids, irritable bowel syndrome, jaundice, light color stool, liver problems, rectal bleeding, and  rectal pain. S   Colonoscopy  Procedure date:  01/01/2009  Findings:         1) Moderate diverticulosis in the transverse to sigmoid (worst in distal descending and sigmoid)   2) Otherwise normal examination, excellent prep   Comments:      Repeat colonoscopy in 10 years.    CT Abdomen/Pelvis  Procedure date:  01/13/2010  Findings:        Wall thickening and inflammatory changes of the sigmoid colon most   consistent with sigmoid diverticulitis.  Neoplasm can have a   similar appearance.  Correlate clinically as for the need for   follow-up imaging or endoscopy.   Procedures Next Due Date:    Colonoscopy: 12/2018   Current Medications (verified): 1)  Wellbutrin Xl 150 Mg  Tb24 (Bupropion Hcl) .Marland Kitchen.. 1 By Mouth Once Daily 2)  Calcium 500/d 500-200 Mg-Unit Tabs (Calcium Carbonate-Vitamin D) .... Once Daily 3)  Alprazolam 0.25 Mg Tabs (Alprazolam) .Marland Kitchen.. 1 By  Mouth At Night As Needed 4)  Sulfacetamide Sodium (Acne) 10 % Lotn (Sulfacetamide Sodium (Acne)) .... Apply To Affected Area Two Times A Day 5)  Multivitamins  Tabs (Multiple Vitamin) .... Once Daily 6)  Vitamin D 400 Unit Tabs (Cholecalciferol) .... Once Daily 7)  Cipro 500 Mg Tabs (Ciprofloxacin Hcl) .... Take 1 Tab Twice Daily X 14 Days 8)  Bentyl 10 Mg Caps (Dicyclomine Hcl) .... Take 1 Tab Twice Daily As Needed For Cramping and Spasms 9)  Vicodin 5-500 Mg Tabs (Hydrocodone-Acetaminophen) .... Take 1 Tab Every 6-8 Hours As Needed For Pain 10)  Zegerid Otc 20-1100 Mg Caps (Omeprazole-Sodium Bicarbonate) .... Take 1 Capsule Daily For 14 Days 11)  Flagyl 500 Mg Tabs (Metronidazole) .... Two Times A Day  Allergies (verified): 1)  ! Demerol 2)  ! Augmentin  Past History:  Past Medical History: Reviewed history from 11/15/2009 and no changes required. Allergic rhinitis GERD hemangioma liver incidental finding -LARGE;RIGHT AND LEFT LOBES OF LIVER. Anxiety Disorder Depression Irritable Bowel Syndrome Diverticulosis/Diverticulitis-RECURRENT Hiatal Hernia  Past Surgical History: Reviewed history from 05/27/2007 and no changes required. Sinus/ Nasal Surgery Ulnar nerve rt arm Tonsillectomy Adnoidectomy Endoscopy w/ dilation  Family History: Reviewed history from 09/08/2008 and no changes required. son with autism  Lung cancer: Mother Family History of Colon Cancer:Maternal Uncle Family History of Heart Disease: Father  Social History: Reviewed history from 12/22/2008 and no changes required. Married Never Smoked   Occupation:Home  Alcohol Use - yes -occ. Daily Caffeine Use -3 Illicit Drug Use - no Patient gets regular  exercise. husband works in [place] and commutes   Vital Signs:  Patient profile:   50 year old female Menstrual status:  irregular Height:      66 inches Weight:      111.13 pounds BMI:     18.00 Pulse rate:   72 / minute Pulse rhythm:   regular BP  sitting:   100 / 60  (left arm) Cuff size:   regular  Vitals Entered By: June McMurray CMA Duncan Dull) (January 27, 2010 1:57 PM)  Physical Exam  General:  Well developed, well nourished, no acute distress. Abdomen:  thin, soft mildly tender in LLQ BS+   Impression & Recommendations:  Problem # 1:  DIVERTICULITIS OF COLON, RECURRENT (ICD-562.11) Assessment Improved 15 minutes spent reviewing this problem She has had 5+ episodes in the last 1-2 years I told her it makes sense to have a segmental resection for this and that se should follow-up wth Dr. gross I answered all ?'s as best I could. I tink she accepts the idea that this is appropriate care (surgery). Still a little tender and slow to finish antibiotics (was reducing # taken). If still tender in next 1-2 weeks then probably repeat Abx.  Patient Instructions: 1)  Finish your antibiotics. If you have persistent abdominal tenderness over the next 1-2 weeks then call us back and we can prescribe additional antibiotics or seeyou. 2)  Make an appointment to see Dr. Renaldo Harrison in July or August. 3)  Copy sent to : Karie Soda, MD 4)  The medication list was reviewed and reconciled.  All changed / newly prescribed medications were explained.  A complete medication list was provided to the patient / caregiver.

## 2010-08-23 NOTE — Progress Notes (Signed)
Summary: side effects   Phone Note Call from Patient Call back at Home Phone [phone]   Caller: Patient Call For: Dr. Leone Payor Reason for Call: Talk to Nurse Summary of Call: pt thinks Cipro is causing nausea and body aches... would like to discuss these as side effects and possibly switching meds Initial call taken by: Vallarie Mare,  November 17, 2009 11:36 AM  Follow-up for Phone Call        patient says that Cipro is causing her to be nauseated. She is taking it with food.  In January she took 250 two times a day, and had minimal nausea.  She is requesting to decrease her dose to 250 mg two times a day.  Please advise Follow-up by: Darcey Nora RN, CGRN,  November 17, 2009 11:53 AM  Additional Follow-up for Phone Call Additional follow up Details #1::        OK TO DECREASE CIPRO TO 250 MG TWICE DAILY,CAN CALL IN A NEW RX  IF NEEDED-,COMPLETE AT LEAST A 10 DAY COURSE,THANKS Additional Follow-up by: Peterson Ao,  November 17, 2009 3:02 PM     Appended Document: side effects patient notified

## 2010-08-23 NOTE — Progress Notes (Signed)
Summary: Triage   Phone Note Call from Patient Call back at Home Phone [phone]   Caller: Patient Call For: Dr. Leone Payor Reason for Call: Talk to Nurse Summary of Call: pt. feels like she is having a diverticulitis flareup. In severe pain and very nauseated Initial call taken by: Karna Christmas,  January 13, 2010 9:04 AM  Follow-up for Phone Call        Patient c/o severe LLQ abdominal pain that started last pm.  She reports severe tenderness and radiating down her left leg.  Patient  has had severe nausea also.  Took a vicodin that she had left from her last diverticulitis flare and now the pain barely tolerable. I discussed with Dr Jarold Motto (DOD) patient to go to ER for eval.  I have paged Willette Cluster RNP to make her aware patient will be coming to [facility] ER.   Follow-up by: Darcey Nora RN, CGRN,  January 13, 2010 10:30 AM  Additional Follow-up for Phone Call Additional follow up Details #1::        Willette Cluster RNP is aware and will meet the patient at the ER. Additional Follow-up by: Darcey Nora RN, CGRN,  January 13, 2010 10:36 AM

## 2010-08-23 NOTE — Progress Notes (Signed)
Summary: triage   Phone Note Call from Patient Call back at Home Phone [phone]   Caller: Patient Call For: Leone Payor Reason for Call: Talk to Nurse Summary of Call: Patient has diverticulties flare in a lot of pain, wants to be seen today. Initial call taken by: Tawni Levy,  November 15, 2009 8:10 AM  Follow-up for Phone Call        Left message for patient to call back Darcey Nora RN, [facility]  November 15, 2009 8:25 AM  Patient with llq abdominal pain that started on Saturday.  Denies constipation or fever.  Patient  states these symptoms are consistent with her usual flares of diverticulitis.  patient will be scheduled for 11/15/09 2:00 with Mike Gip PA  Follow-up by: Darcey Nora RN, CGRN,  November 15, 2009 8:38 AM

## 2010-08-23 NOTE — Progress Notes (Signed)
Summary: Cipro  Phone Note Call from Patient Call back at [facility] Phone [phone]   Caller: Patient Call For: Dr. Leone Payor Reason for Call: Refill Medication Summary of Call: Cipro... [place], [phone] pt says she is having the beginning of a diverticulitis attack... tomorrow is her birthday with a celebration and would really like to feel better by then... please call pt when rx is sent in Initial call taken by: Vallarie Mare,  September 03, 2009 10:02 AM  Follow-up for Phone Call        Pt states she is having "the exact same symptoms as I always have".  Pt is having LLQ pain that makes her back and elbow hurt.  She is having a slight amount of bloating today.  She is lethargic and has no energy.  She denies any nausea, vomiting, diarrhea, or constipation.  Pt states she never has those symptoms with a flare.  Pt states that she was told at last visit to just call and we would send in rx for her.  Please advise. Follow-up by: Francee Piccolo CMA Duncan Dull),  September 03, 2009 2:54 PM  Additional Follow-up for Phone Call Additional follow up Details #1::        cipro 250 mg two times a day x 10 days call her to check on her Mon 2/14 if worse, she is call back sooner Additional Follow-up by: Iva Boop MD, Clementeen Graham,  September 03, 2009 4:28 PM    Additional Follow-up for Phone Call Additional follow up Details #2::    Notified pt that rx would be called in and we will call to check on her on Monday.  If she feels worse she should call MD on call.  Pt voices understanding and is agreeable. Follow-up by: Francee Piccolo CMA Duncan Dull),  September 03, 2009 5:08 PM  Additional Follow-up for Phone Call Additional follow up Details #3:: Details for Additional Follow-up Action Taken: pt was doing better by Saturday and a completely new person by Sunday.  She is having no problems now.  Pt states she will call with her next flare to have an appt. Additional Follow-up by: Stephanie Phillips CMA  (AAMA),  September 09, 2009 11:36 AM  New/Updated Medications: CIPRO 250 MG TABS (CIPROFLOXACIN HCL) Take 1 tablet by mouth two times a day for 10 days Prescriptions: CIPRO 250 MG TABS (CIPROFLOXACIN HCL) Take 1 tablet by mouth two times a day for 10 days  #20 x 0   Entered by:   Stephanie Phillips CMA (AAMA)   Authorized by:   Kyren Vaux E Lovada Barwick MD, FACG   Signed by:   Stephanie Phillips CMA (AAMA) on 09/03/2009   Method used:   Electronically to        [facility]  [address] #[id number]* (retail)       [address]       [place], [place]  [address]       Ph: [phone]       Fax: [phone]   RxID:   [id number]

## 2010-08-25 NOTE — Progress Notes (Signed)
Summary: Reschedule cpx  Phone Note Call from Patient Call back at Home Phone [phone]   Caller: Patient Summary of Call: Pt wants to know if you counted her last ov as her CPX or does she need to reschedule the appt. You did go over her labs with her at her last visit. Initial call taken by: Romualdo Bolk, CMA (AAMA),  July 29, 2010 2:41 PM  Follow-up for Phone Call        unless she is having a problem ongogin  doesnt have to come back for  another appt  unless having a problem.   Marland Kitchen   otherwise  can do yearly cpx with labs   Follow-up by: Madelin Headings MD,  August 11, 2010 9:45 PM  Additional Follow-up for Phone Call Additional follow up Details #1::        Pt aware of this. Additional Follow-up by: Romualdo Bolk, CMA (AAMA),  August 12, 2010 8:02 AM

## 2010-08-25 NOTE — Assessment & Plan Note (Signed)
Summary: RIGHT BREAST LUMP/NJR   Vital Signs:  Patient profile:   50 year old female Menstrual status:  irregular LMP:     05/12/2010 Weight:      108 pounds Pulse rate:   66 / minute BP sitting:   110 / 70  (left arm) Cuff size:   regular  Vitals Entered By: Romualdo Bolk, CMA Duncan Dull) (July 21, 2010 9:45 AM) CC: Lump in Rt breast in the 9 o'clock position LMP (date): 05/12/2010 LMP - Character: heavy to light Menarche (age onset years): 12-13   Menses interval (days): varies Menstrual flow (days): 4-6 Enter LMP: 05/12/2010   History of Present Illness: Jessica Chan comes in today  for acute problem .  She noted on christmas eve when taking shower and shaving that  there was a lump on right breast .  NO hxo f same and UTD on mammo.  normal this year.  COmes in for evaluation. NO discharge trauma pain. or hx of same.  NOt on hormones . Nochange since discovery . Doesnt do reg BExam but never noted this before.  Preventive Screening-Counseling & Management  Alcohol-Tobacco     Alcohol drinks/day: <1     Alcohol type: beer     Smoking Status: never  Caffeine-Diet-Exercise     Caffeine use/day: 3     Does Patient Exercise: yes  Current Medications (verified): 1)  Wellbutrin Xl 150 Mg  Tb24 (Bupropion Hcl) .Marland Kitchen.. 1 By Mouth Once Daily 2)  Calcium 500/d 500-200 Mg-Unit Tabs (Calcium Carbonate-Vitamin D) .... Once Daily 3)  Alprazolam 0.25 Mg Tabs (Alprazolam) .Marland Kitchen.. 1 By Mouth At Night As Needed 4)  Sulfacetamide Sodium (Acne) 10 % Lotn (Sulfacetamide Sodium (Acne)) .... Apply To Affected Area Two Times A Day 5)  Multivitamins  Tabs (Multiple Vitamin) .... Once Daily 6)  Vitamin D 400 Unit Tabs (Cholecalciferol) .... Once Daily 7)  Zegerid Otc 20-1100 Mg Caps (Omeprazole-Sodium Bicarbonate) .... Take 1 Capsule Daily For 14 Days  Allergies (verified): 1)  ! Demerol 2)  ! Augmentin  Past History:  Past medical, surgical, family and social histories (including risk  factors) reviewed, and no changes noted (except as noted below).  Past Medical History: Reviewed history from 11/15/2009 and no changes required. Allergic rhinitis GERD hemangioma liver incidental finding -LARGE;RIGHT AND LEFT LOBES OF LIVER. Anxiety Disorder Depression Irritable Bowel Syndrome Diverticulosis/Diverticulitis-RECURRENT Hiatal Hernia  Past Surgical History: Reviewed history from 05/27/2007 and no changes required. Sinus/ Nasal Surgery Ulnar nerve rt arm Tonsillectomy Adnoidectomy Endoscopy w/ dilation  Past History:  Care Management: Gastroenterology: Leone Payor Gynecology: Urology: Dermatology:  Family History: Reviewed history from 09/08/2008 and no changes required. son with autism  Lung cancer: Mother Family History of Colon Cancer:Maternal Uncle Family History of Heart Disease: Father MGM breast cancer in her 53s  Social History: Reviewed history from 12/22/2008 and no changes required. Married Never Smoked   Occupation:Home  Alcohol Use - yes -occ. Daily Caffeine Use -3 Illicit Drug Use - no Patient gets regular exercise. husband  recently suregery or prostate cancer   Review of Systems  The patient denies anorexia, fever, weight loss, weight gain, vision loss, decreased hearing, prolonged cough, melena, hematochezia, severe indigestion/heartburn, enlarged lymph nodes, and angioedema.         supposed to have bowel surgery for her recurrent diverticulitis    when possible.  no current flare   Physical Exam  General:  Well-developed,well-nourished,in no acute distress; alert,appropriate and cooperative throughout examination Head:  normocephalic and atraumatic.  Breasts:  right breast with large mass right lateral  seen  on inspection  4-6 cm mobile smooth ovoid mass right about 9 oclock . NO discharge and axill a is clear   left breast normal Lungs:  normal respiratory effort, no intercostal retractions, and no accessory muscle use.     Heart:  normal rate, regular rhythm, and no murmur.   Abdomen:  Bowel sounds positive,abdomen soft and non-tender without masses, organomegaly or  noted. Pulses:  nl cap refill Neurologic:  non focal  Skin:  turgor normal, color normal, no ecchymoses, and no petechiae.   Cervical Nodes:  No lymphadenopathy noted Axillary Nodes:  No palpable lymphadenopathy Psych:  Oriented X3, normally interactive, and good eye contact.     Impression & Recommendations:  Problem # 1:  BREAST MASS, RIGHT (ICD-611.72) Assessment New  new and large but mobile .   send for  diagnostic mammo and [place]   asap.   referred to [facility] for tuesday Jan 3rd.  Expectant management   Orders: Radiology Referral (Radiology)  Problem # 2:  DIVERTICULITIS OF COLON, RECURRENT (ICD-562.11) surgery rec  .   quiescent currently  Complete Medication List: 1)  Wellbutrin Xl 150 Mg Tb24 (Bupropion hcl) .Marland Kitchen.. 1 by mouth once daily 2)  Calcium 500/d 500-200 Mg-unit Tabs (Calcium carbonate-vitamin d) .... Once daily 3)  Alprazolam 0.25 Mg Tabs (Alprazolam) .Marland Kitchen.. 1 by mouth at night as needed 4)  Sulfacetamide Sodium (acne) 10 % Lotn (Sulfacetamide sodium (acne)) .... Apply to affected area two times a day 5)  Multivitamins Tabs (Multiple vitamin) .... Once daily 6)  Vitamin D 400 Unit Tabs (Cholecalciferol) .... Once daily 7)  Zegerid Otc 20-1100 Mg Caps (Omeprazole-sodium bicarbonate) .... Take 1 capsule daily for 14 days   Orders Added: 1)  Est. Patient Level IV [46270] 2)  Radiology Referral [Radiology]

## 2010-10-09 LAB — COMPREHENSIVE METABOLIC PANEL
ALT: 10 U/L (ref 0–35)
AST: 16 U/L (ref 0–37)
Albumin: 4 g/dL (ref 3.5–5.2)
Alkaline Phosphatase: 51 U/L (ref 39–117)
BUN: 10 mg/dL (ref 6–23)
CO2: 28 mEq/L (ref 19–32)
Calcium: 8.6 mg/dL (ref 8.4–10.5)
Chloride: 105 mEq/L (ref 96–112)
Creatinine, Ser: 0.68 mg/dL (ref 0.4–1.2)
GFR calc Af Amer: >60 mL/min (ref >60)
GFR calc non Af Amer: >60 mL/min (ref >60)
Glucose, Bld: 93 mg/dL (ref 70–99)
Potassium: 3.8 mEq/L (ref 3.5–5.1)
Sodium: 138 mEq/L (ref 135–145)
Total Bilirubin: 0.6 mg/dL (ref 0.3–1.2)
Total Protein: 6.5 g/dL (ref 6.0–8.3)

## 2010-10-09 LAB — DIFFERENTIAL
Basophils Absolute: 0 10*3/uL (ref 0.0–0.1)
Basophils Relative: 0 % (ref 0–1)
Eosinophils Absolute: 0 10*3/uL (ref 0.0–0.7)
Eosinophils Relative: 0 % (ref 0–5)
Lymphocytes Relative: 7 % — ABNORMAL LOW (ref 12–46)
Lymphs Abs: 0.8 10*3/uL (ref 0.7–4.0)
Monocytes Absolute: 0.7 10*3/uL (ref 0.1–1.0)
Monocytes Relative: 5 % (ref 3–12)
Neutro Abs: 10.9 10*3/uL — ABNORMAL HIGH (ref 1.7–7.7)
Neutrophils Relative %: 88 % — ABNORMAL HIGH (ref 43–77)

## 2010-10-09 LAB — URINALYSIS, ROUTINE W REFLEX MICROSCOPIC
Bilirubin Urine: NEGATIVE
Glucose, UA: NEGATIVE mg/dL
Hgb urine dipstick: NEGATIVE
Ketones, ur: 15 mg/dL — AB
Nitrite: NEGATIVE
Protein, ur: NEGATIVE mg/dL
Specific Gravity, Urine: 1.022 (ref 1.005–1.030)
Urobilinogen, UA: 0.2 mg/dL (ref 0.0–1.0)
pH: 7.5 (ref 5.0–8.0)

## 2010-10-09 LAB — BASIC METABOLIC PANEL
BUN: 4 mg/dL — ABNORMAL LOW (ref 6–23)
CO2: 28 mEq/L (ref 19–32)
Calcium: 7.8 mg/dL — ABNORMAL LOW (ref 8.4–10.5)
Chloride: 109 mEq/L (ref 96–112)
Creatinine, Ser: 0.7 mg/dL (ref 0.4–1.2)
GFR calc Af Amer: >60 mL/min (ref >60)
GFR calc non Af Amer: >60 mL/min (ref >60)
Glucose, Bld: 93 mg/dL (ref 70–99)
Potassium: 4.3 mEq/L (ref 3.5–5.1)
Sodium: 140 mEq/L (ref 135–145)

## 2010-10-09 LAB — CBC
HCT: 34.1 % — ABNORMAL LOW (ref 36.0–46.0)
HCT: 36.7 % (ref 36.0–46.0)
Hemoglobin: 11.1 g/dL — ABNORMAL LOW (ref 12.0–15.0)
Hemoglobin: 12.1 g/dL (ref 12.0–15.0)
MCH: 30.9 pg (ref 26.0–34.0)
MCHC: 32.7 g/dL (ref 30.0–36.0)
MCHC: 32.9 g/dL (ref 30.0–36.0)
MCV: 94.1 fL (ref 78.0–100.0)
MCV: 94.5 fL (ref 78.0–100.0)
Platelets: 174 10*3/uL (ref 150–400)
RBC: 3.9 MIL/uL (ref 3.87–5.11)
RDW: 12.8 % (ref 11.5–15.5)
RDW: 12.8 % (ref 11.5–15.5)
WBC: 12.4 10*3/uL — ABNORMAL HIGH (ref 4.0–10.5)

## 2010-10-09 LAB — URINE MICROSCOPIC-ADD ON

## 2010-10-09 LAB — LIPASE, BLOOD: Lipase: 30 U/L (ref 11–59)

## 2010-11-10 ENCOUNTER — Other Ambulatory Visit: Payer: Self-pay | Admitting: Internal Medicine

## 2010-11-10 NOTE — Telephone Encounter (Signed)
Rx sent to [facility]. Pt is due for a yearly cpx. Letter sent to pt.

## 2010-12-09 NOTE — Assessment & Plan Note (Signed)
[facility]                         GASTROENTEROLOGY OFFICE NOTE   NAME:Braggs, LORIENE TAUNTON                        MRN:          [id number]  DATE:08/15/2006                            DOB:          [birth year]    Jessica Chan comes in on August 15, 2006.  Mild epigastric pain for the last  month and also feels she feels a repeat colonoscopic examination.  Her  last one was 5 years previously.  She was found to have diverticular  disease at that time and diverticulitis.  She also needed refills on  Zegerid and Reglan.  She described some early satiety.  She said as soon  as she eats she just feels like she does not need to eat anymore.  She  has some tenderness in the epigastric region.  She said she has  questionably been losing weight.   MEDICATIONS:  1. Wellbutrin.  2. Calcium.  3. Multivitamins.  4. Zegerid.  5. Reglan, just to be taking the latter at h.s., 10 mg.   PHYSICAL EXAMINATION:  She is quite thin and weighs 110 clothed.  Blood  pressure 110/72, pulse 76 and regular.  Neck, heart and extremities are  all basically unremarkable.   IMPRESSION:  1. Abdominal discomfort with early satiety of questionable etiology.  2. History of diverticular disease.  3. Questionable gallbladder disease in the past, although not      confirmed.  4. Anxiety and depression.  5. Irritable bowel syndrome.  6. Gastroesophageal reflux disease.   RECOMMENDATIONS:  Gastric emptying study, an ultrasound of the abdomen  and a HIDA scan, get CBC, celiac sprue, amylase, lipase, schedule for  colonoscopic examination, and have her return in 3 or 4 weeks for  followup.   It should be noted that HER MOTHER AND GRANDMOTHER BOTH HAD GALLBLADDER  DISEASE, so therefore I think there is certainly a possibility of this,  although it was not detected on her last exams.     Ulyess Mort, MD  Electronically Signed    SML/MedQ  DD: 08/15/2006  DT: 08/15/2006  Job #:  [id number]

## 2011-02-20 ENCOUNTER — Telehealth: Payer: Self-pay | Admitting: Internal Medicine

## 2011-02-20 MED ORDER — BUPROPION HCL ER (XL) 150 MG PO TB24: 150.0000 mg | ORAL_TABLET | ORAL | Status: DC

## 2011-02-20 NOTE — Telephone Encounter (Signed)
Pt aware that rx has been called in

## 2011-02-20 NOTE — Telephone Encounter (Signed)
Pt called 7/30. Is out of town with her mother, who has been admitted to the hospital. Pt has to be gone longer than anticipated. Did not bring enough Wellbutrin and would like pills to be called in (3) to get her through Wednesday. The pharmacy is [facility] in [place]. Their phone is [phone], fax is [phone]. Please call pt as well if necessary.

## 2011-03-03 ENCOUNTER — Other Ambulatory Visit: Payer: Self-pay | Admitting: Internal Medicine

## 2011-05-18 ENCOUNTER — Encounter: Payer: Self-pay | Admitting: Internal Medicine

## 2011-06-02 ENCOUNTER — Other Ambulatory Visit: Payer: Self-pay | Admitting: Internal Medicine

## 2011-09-14 ENCOUNTER — Other Ambulatory Visit: Payer: Self-pay | Admitting: Internal Medicine

## 2011-12-25 ENCOUNTER — Other Ambulatory Visit: Payer: Self-pay | Admitting: Internal Medicine

## 2011-12-25 NOTE — Telephone Encounter (Signed)
Pt last seen 07/21/2010. Pt has an upcoming appt on 12/28/11. Pls advise.

## 2011-12-25 NOTE — Telephone Encounter (Signed)
Ok to refill for 6 months   If she wants to stay on same dose please send this in.

## 2011-12-28 ENCOUNTER — Encounter: Payer: Self-pay | Admitting: Internal Medicine

## 2011-12-28 ENCOUNTER — Ambulatory Visit (INDEPENDENT_AMBULATORY_CARE_PROVIDER_SITE_OTHER): Payer: Self-pay | Admitting: Internal Medicine

## 2011-12-28 VITALS — BP 114/70 | HR 72 | Temp 98.2°F | Wt 110.0 lb

## 2011-12-28 DIAGNOSIS — K219 Gastro-esophageal reflux disease without esophagitis: Secondary | ICD-10-CM

## 2011-12-28 DIAGNOSIS — Z299 Encounter for prophylactic measures, unspecified: Secondary | ICD-10-CM

## 2011-12-28 DIAGNOSIS — K5730 Diverticulosis of large intestine without perforation or abscess without bleeding: Secondary | ICD-10-CM

## 2011-12-28 DIAGNOSIS — F4323 Adjustment disorder with mixed anxiety and depressed mood: Secondary | ICD-10-CM | POA: Insufficient documentation

## 2011-12-28 DIAGNOSIS — G479 Sleep disorder, unspecified: Secondary | ICD-10-CM

## 2011-12-28 LAB — CBC WITH DIFFERENTIAL/PLATELET
Basophils Absolute: 0 10*3/uL (ref 0.0–0.1)
Eosinophils Absolute: 0.1 10*3/uL (ref 0.0–0.7)
HCT: 38.6 % (ref 36.0–46.0)
Hemoglobin: 13 g/dL (ref 12.0–15.0)
Lymphocytes Relative: 26.8 % (ref 12.0–46.0)
Lymphs Abs: 1.2 10*3/uL (ref 0.7–4.0)
MCHC: 33.5 g/dL (ref 30.0–36.0)
Neutro Abs: 2.8 10*3/uL (ref 1.4–7.7)
Platelets: 237 10*3/uL (ref 150.0–400.0)
RDW: 13 % (ref 11.5–14.6)

## 2011-12-28 LAB — LIPID PANEL
Cholesterol: 167 mg/dL (ref 0–200)
HDL: 56.3 mg/dL (ref >39.00)
LDL Cholesterol: 95 mg/dL (ref 0–99)
Triglycerides: 80 mg/dL (ref 0.0–149.0)
VLDL: 16 mg/dL (ref 0.0–40.0)

## 2011-12-28 LAB — BASIC METABOLIC PANEL
BUN: 17 mg/dL (ref 6–23)
CO2: 29 mEq/L (ref 19–32)
Calcium: 9 mg/dL (ref 8.4–10.5)
GFR: 102.01 mL/min (ref >60.00)
Glucose, Bld: 57 mg/dL — ABNORMAL LOW (ref 70–99)
Sodium: 142 mEq/L (ref 135–145)

## 2011-12-28 LAB — HEPATIC FUNCTION PANEL
Albumin: 4.1 g/dL (ref 3.5–5.2)
Total Bilirubin: 0.6 mg/dL (ref 0.3–1.2)

## 2011-12-28 MED ORDER — ESCITALOPRAM OXALATE 10 MG PO TABS: 10.0000 mg | ORAL_TABLET | Freq: Every day | ORAL | Status: DC

## 2011-12-28 MED ORDER — ALPRAZOLAM 0.25 MG PO TABS: 0.2500 mg | ORAL_TABLET | Freq: Every evening | ORAL | Status: DC | PRN

## 2011-12-28 NOTE — Progress Notes (Signed)
  Subjective:    Patient ID: Jessica Chan, female    DOB: 1961/05/17, 51 y.o.   MRN: [id number]  HPI  Patient comes in today for follow up of  multiple medical problems.  Last ov was 2011 . Since then she was supposed to have colon resection for recurrent diverticulitis  but husband got prostatae cancer and everything got postponed.She has done fairly well on the wellbutrin but feels like panicky at times and hard to sleep .  Elevated HR more when exercising.  / if has deconditioning vs other with exercise intolerance . ? If having panic attacks .  Using xanax ocass  From last year  Expired. Had mild divertic sx over the weekend and took rx for cipro  Asks about fu. D oing a lot of better. hasnt had labs or check in over a year   Review of Systems Neg fever weight change NVD bleeding syncope GU sx  No rash.  Past history family history social history reviewed in the electronic medical record.     Objective:   Physical Exam BP 114/70  Pulse 72  Temp(Src) 98.2 F (36.8 C) (Oral)  Wt 110 lb (49.896 kg)  SpO2 99%  LMP 07/25/2011 Wt Readings from Last 3 Encounters:  12/28/11 110 lb (49.896 kg)  07/21/10 108 lb (48.988 kg)  01/27/10 111 lb 2.1 oz (50.409 kg)    Physical Exam: Vital signs reviewed NWG:NFAO is a well-developed well-nourished alert cooperative  white female who appears her stated age in no acute distress.  HEENT: normocephalic atraumatic , Eyes: PERRL EOM's full, conjunctiva clear, Nares: paten,t no deformity discharge or tenderness., Ears: no deformity EAC's clear TMs with normal landmarks. Mouth: clear OP, no lesions, edema.  Moist mucous membranes. Dentition in adequate repair. NECK: supple without masses, thyromegaly or bruits. CHEST/PULM:  Clear to auscultation and percussion breath sounds equal no wheeze , rales or rhonchi. No chest wall deformities or tenderness. CV: PMI is nondisplaced, S1 S2 no gallops, murmurs, rubs. Peripheral pulses are full without delay.No JVD  .  ABDOMEN: Bowel sounds normal nontender  No guard or rebound, no hepato splenomegal no CVA tenderness.   Extremtities:  No clubbing cyanosis or edema, no acute joint swelling or redness no focal atrophy NEURO:  Oriented x3, cranial nerves 3-12 appear to be intact, no obvious focal weakness,gait within normal limits no abnormal reflexes or asymmetrical SKIN: No acute rashes normal turgor, color, no bruising or petechiae. PSYCH: Oriented, good eye contact, no obvious depression anxiety, cognition and judgment appear normal. LN: no cervical adenopathy    Assessment & Plan:  Moods  Mixed anxiety   Add ssri  And close fu  Continue low dose wellbutrin for now  ? If exercise intolerance from deconditioning  Or other. R/o metabolic  Has normal exam today.  recurring diverticulitis rx over the weekend .  stil due for bowel resection  . Have her get to GI.  Labs and then ROV for cpx  And med fu.

## 2011-12-28 NOTE — Patient Instructions (Signed)
Begin low-dose Lexapro from 5 mg a day increased to 10 mg a day Continue the Wellbutrin.  Can use the Xanax as discussed.  labs today and followup in 3-4 weeks.  Please followup with Dr. Elenore Paddy and GI about the diverticulitis recurrent

## 2011-12-29 NOTE — Progress Notes (Signed)
Quick Note:  Spoke with Jessica Chan- informed of dr. Rosezella Florida instructions . No additional questions ______

## 2011-12-30 ENCOUNTER — Encounter: Payer: Self-pay | Admitting: Internal Medicine

## 2012-07-29 ENCOUNTER — Ambulatory Visit (INDEPENDENT_AMBULATORY_CARE_PROVIDER_SITE_OTHER): Payer: BC Managed Care – PPO | Admitting: Internal Medicine

## 2012-07-29 ENCOUNTER — Encounter: Payer: Self-pay | Admitting: Internal Medicine

## 2012-07-29 VITALS — BP 116/76 | HR 72 | Temp 98.0°F | Wt 106.0 lb

## 2012-07-29 DIAGNOSIS — J0100 Acute maxillary sinusitis, unspecified: Secondary | ICD-10-CM

## 2012-07-29 DIAGNOSIS — J309 Allergic rhinitis, unspecified: Secondary | ICD-10-CM

## 2012-07-29 DIAGNOSIS — F4323 Adjustment disorder with mixed anxiety and depressed mood: Secondary | ICD-10-CM

## 2012-07-29 HISTORY — DX: Acute maxillary sinusitis, unspecified: J01.00

## 2012-07-29 MED ORDER — BUPROPION HCL ER (XL) 150 MG PO TB24: 150.0000 mg | ORAL_TABLET | ORAL | Status: DC

## 2012-07-29 MED ORDER — ALPRAZOLAM 0.25 MG PO TABS: 0.2500 mg | ORAL_TABLET | Freq: Every evening | ORAL | Status: DC | PRN

## 2012-07-29 MED ORDER — CEFUROXIME AXETIL 500 MG PO TABS: 500.0000 mg | ORAL_TABLET | Freq: Two times a day (BID) | ORAL | Status: DC

## 2012-07-29 NOTE — Progress Notes (Signed)
Chief Complaint  Patient presents with  . Sinus Problem    Production of green/bloody mucus.  Cough is dry for the most part.  Started as a cold last week.  . Fever    refill meds check  . Chills  . Cough    HPI: Patient comes in today for SDA for  new problem evaluation. Here with son  Also to be seen  Onset with a cold sx but the pressure is really bad in her face and right sided bloody  mucous  Using otc cold and sinus and netti pot.  Minimal slight releif .    Right cheek area sore tender  Inc drainage with blood on right side no fever some chills?  Had flu vaccine this year.   Hx of sinu surgery in the past  Also needs refill of mood meds : and review wellbutrin 150 xr qd  Helping stress at home this year doing ok.   Is perimenopausal  No period for a while. Using xanaxneeded and tries to limit continuous use  Sleeps well with this.  Some dec appetite when worried   Has biometric screening  Done via husbands work situation  ROS: See pertinent positives and negatives per HPI. No cp sob weight loss bleeding  Past Medical History  Diagnosis Date  . Allergic rhinitis   . GERD (gastroesophageal reflux disease)   . Hemangioma of liver     incidental finding-large, right and left lobes of liver   . Anxiety disorder   . Depression   . IBS (irritable bowel syndrome)   . Diverticulosis   . Hiatal hernia     Family History  Problem Relation Age of Onset  . Cancer Mother     lung  . Heart disease Father   . Autism Son   . Cancer Maternal Uncle     colon  . Cancer Maternal Grandmother     breast     History   Social History  . Marital Status: Married    Spouse Name: N/A    Number of Children: N/A  . Years of Education: N/A   Social History Main Topics  . Smoking status: Never Smoker   . Smokeless tobacco: None  . Alcohol Use: Yes     Comment: Social Drinker  . Drug Use: None  . Sexually Active: None   Other Topics Concern  . None   Social History Narrative   Married HHof 4 one sone autisticHusband had prostate cancer in last few yearsSome exercise  Runs No tobacco    Outpatient Encounter Prescriptions as of 07/29/2012  Medication Sig Dispense Refill  . ALPRAZolam (XANAX) 0.25 MG tablet Take 1 tablet (0.25 mg total) by mouth at bedtime as needed for sleep.  30 tablet  1  . buPROPion (WELLBUTRIN XL) 150 MG 24 hr tablet Take 1 tablet (150 mg total) by mouth every morning.  90 tablet  3  . calcium-vitamin D (OSCAL WITH D) 500-200 MG-UNIT per tablet Take 1 tablet by mouth daily.      . Multiple Vitamin (MULTIVITAMIN) tablet Take 1 tablet by mouth daily.      Maxwell Caul Bicarbonate (ZEGERID) 20-1100 MG CAPS Take 1 capsule by mouth daily before breakfast.      . vitamin E 400 UNIT capsule Take 400 Units by mouth daily.      . [DISCONTINUED] ALPRAZolam (XANAX) 0.25 MG tablet Take 1 tablet (0.25 mg total) by mouth at bedtime as needed for sleep.  30  tablet  1  . [DISCONTINUED] buPROPion (WELLBUTRIN XL) 150 MG 24 hr tablet TAKE 1 TABLET DAILY (NEEDS A FOLLOW UP  APPOINTMENT BEFORE NEXT REFILL)  90 tablet  1  . cefUROXime (CEFTIN) 500 MG tablet Take 1 tablet (500 mg total) by mouth 2 (two) times daily.  20 tablet  0  . [DISCONTINUED] escitalopram (LEXAPRO) 10 MG tablet Take 1 tablet (10 mg total) by mouth daily. Take 1/2 pill 5 mg pre day for first week  30 tablet  2    EXAM:  BP 116/76  Pulse 72  Temp 98 F (36.7 C) (Oral)  Wt 106 lb (48.081 kg)  LMP 07/25/2011  There is no height on file to calculate BMI.  GENERAL: vitals reviewed and listed above, alert, oriented, appears well hydrated and in no acute distress Congested  HEENT: Normocephalic ;atraumatic , Eyes;  PERRL, EOMs  Full, lids and conjunctiva clear,,Ears: no deformities, canals nl, TM landmarks normal, Nose: no deformity congested and red right maxilla tender no swelling    Mouth : OP clear without lesion or edema . Redder on right OP wall    NECK: no obvious masses on inspection  palpation  Tender right neck ac area shoddy nodes   LUNGS: clear to auscultation bilaterally, no wheezes, rales or rhonchi, good air movement  CV: HRRR, no clubbing cyanosis or  peripheral edema nl cap refill   MS: moves all extremities without noticeable focal  abnormality  PSYCH: pleasant and cooperative, no obvious depression  Mildly stressed   Good eye contact and insight Wt Readings from Last 3 Encounters:  07/29/12 106 lb (48.081 kg)  12/28/11 110 lb (49.896 kg)  07/21/10 108 lb (48.988 kg)    ASSESSMENT AND PLAN:  Discussed the following assessment and plan:  1. Sinusitis, acute maxillary    right  hx of sinus surgery  fu if needed recurrecne   2. Adjustment reaction with anxiety and depression    cont meds for now as directed  rov in 6 months   3. ALLERGIC RHINITIS     -Patient advised to return or notify health care team  immediately if symptoms worsen or persist or new concerns arise.  Patient Instructions  Bring in copy of bioscreen .     Antibiotic for sinus infection  Ok to continue the wellbutrin    For now  Xanax . as needed .   Plan  Ov in 6 months or as needed    Neta Mends. Panosh M.D.

## 2012-07-29 NOTE — Patient Instructions (Addendum)
Bring in copy of bioscreen .     Antibiotic for sinus infection  Ok to continue the wellbutrin    For now  Xanax . as needed .   Plan  Ov in 6 months or as needed

## 2012-08-20 ENCOUNTER — Ambulatory Visit (INDEPENDENT_AMBULATORY_CARE_PROVIDER_SITE_OTHER): Payer: BC Managed Care – PPO | Admitting: Internal Medicine

## 2012-08-20 ENCOUNTER — Telehealth: Payer: Self-pay | Admitting: Internal Medicine

## 2012-08-20 ENCOUNTER — Encounter: Payer: Self-pay | Admitting: Internal Medicine

## 2012-08-20 VITALS — BP 100/80 | HR 94 | Temp 98.0°F | Wt 105.0 lb

## 2012-08-20 DIAGNOSIS — K5732 Diverticulitis of large intestine without perforation or abscess without bleeding: Secondary | ICD-10-CM

## 2012-08-20 DIAGNOSIS — K5792 Diverticulitis of intestine, part unspecified, without perforation or abscess without bleeding: Secondary | ICD-10-CM

## 2012-08-20 DIAGNOSIS — R109 Unspecified abdominal pain: Secondary | ICD-10-CM

## 2012-08-20 MED ORDER — METRONIDAZOLE 500 MG PO TABS: 500.0000 mg | ORAL_TABLET | Freq: Three times a day (TID) | ORAL | Status: DC

## 2012-08-20 MED ORDER — CIPROFLOXACIN HCL 500 MG PO TABS: 500.0000 mg | ORAL_TABLET | Freq: Two times a day (BID) | ORAL | Status: DC

## 2012-08-20 NOTE — Telephone Encounter (Signed)
Called and left a message for Earma Reading, RN asking her to please contact the patient and triage.

## 2012-08-20 NOTE — Patient Instructions (Addendum)
I  Agree  That this is probably diverticulitis attack . Take both antibiotics as tolerated . Keep appt with gi to reestablish tomorrow. Contact on call service if worsening or advice. \\clear liquids at this time .     Diverticulitis A diverticulum is a small pouch or sac on the colon. Diverticulosis is the presence of these diverticula on the colon. Diverticulitis is the irritation (inflammation) or infection of diverticula. CAUSES  The colon and its diverticula contain bacteria. If food particles block the tiny opening to a diverticulum, the bacteria inside can grow and cause an increase in pressure. This leads to infection and inflammation and is called diverticulitis. SYMPTOMS   Abdominal pain and tenderness. Usually, the pain is located on the left side of your abdomen. However, it could be located elsewhere.  Fever.  Bloating.  Feeling sick to your stomach (nausea).  Throwing up (vomiting).  Abnormal stools. DIAGNOSIS  Your caregiver will take a history and perform a physical exam. Since many things can cause abdominal pain, other tests may be necessary. Tests may include:  Blood tests.  Urine tests.  X-ray of the abdomen.  CT scan of the abdomen. Sometimes, surgery is needed to determine if diverticulitis or other conditions are causing your symptoms. TREATMENT  Most of the time, you can be treated without surgery. Treatment includes:  Resting the bowels by only having liquids for a few days. As you improve, you will need to eat a low-fiber diet.  Intravenous (IV) fluids if you are losing body fluids (dehydrated).  Antibiotic medicines that treat infections may be given.  Pain and nausea medicine, if needed.  Surgery if the inflamed diverticulum has burst. HOME CARE INSTRUCTIONS   Try a clear liquid diet (broth, tea, or water for as long as directed by your caregiver). You may then gradually begin a low-fiber diet as tolerated. A low-fiber diet is a diet with less  than 10 grams of fiber. Choose the foods below to reduce fiber in the diet:  White breads, cereals, rice, and pasta.  Cooked fruits and vegetables or soft fresh fruits and vegetables without the skin.  Ground or well-cooked tender beef, ham, veal, lamb, pork, or poultry.  Eggs and seafood.  After your diverticulitis symptoms have improved, your caregiver may put you on a high-fiber diet. A high-fiber diet includes 14 grams of fiber for every 1000 calories consumed. For a standard 2000 calorie diet, you would need 28 grams of fiber. Follow these diet guidelines to help you increase the fiber in your diet. It is important to slowly increase the amount fiber in your diet to avoid gas, constipation, and bloating.  Choose whole-grain breads, cereals, pasta, and brown rice.  Choose fresh fruits and vegetables with the skin on. Do not overcook vegetables because the more vegetables are cooked, the more fiber is lost.  Choose more nuts, seeds, legumes, dried peas, beans, and lentils.  Look for food products that have greater than 3 grams of fiber per serving on the Nutrition Facts label.  Take all medicine as directed by your caregiver.  If your caregiver has given you a follow-up appointment, it is very important that you go. Not going could result in lasting (chronic) or permanent injury, pain, and disability. If there is any problem keeping the appointment, call to reschedule. SEEK MEDICAL CARE IF:   Your pain does not improve.  You have a hard time advancing your diet beyond clear liquids.  Your bowel movements do not return to  normal. SEEK IMMEDIATE MEDICAL CARE IF:   Your pain becomes worse.  You have an oral temperature above 102 F (38.9 C), not controlled by medicine.  You have repeated vomiting.  You have bloody or black, tarry stools.  Symptoms that brought you to your caregiver become worse or are not getting better. MAKE SURE YOU:   Understand these  instructions.  Will watch your condition.  Will get help right away if you are not doing well or get worse. Document Released: 04/19/2005 Document Revised: 10/02/2011 Document Reviewed: 08/15/2010 [facility] Patient Information 2013 [place], [place].

## 2012-08-20 NOTE — Progress Notes (Signed)
Chief Complaint  Patient presents with  . Diverticulitis    Abdominal pain and vomiting.    HPI: Patient comes in today for SDA for  problem evaluation. Onset of twinge and  Left side abd pain and bloating in left abd  Yesterday but got worse last pm and  last might got severe firey pain  Took  A pain pill  Hydrocodone? Marland Kitchen  And then vomited    And had syncope .  Per   Husband  . He said her head was lying against the toilet. She currently has no head pain neurologic symptoms.  Less nausea now but has significant constant pain  Had some cereal this am.   No diarrhea  Nl bms no blood.  No utis.    Pain level about  6/10  10 last pm.  Has hx of recurrent diverticulitis and had planned to have surgery but then her husband developed prostate cancer and everything was delayed. She hasn't had a major attack in almost 2 years. ROS: See pertinent positives and negatives per HPI.  Past Medical History  Diagnosis Date  . Allergic rhinitis   . GERD (gastroesophageal reflux disease)   . Hemangioma of liver     incidental finding-large, right and left lobes of liver   . Anxiety disorder   . Depression   . IBS (irritable bowel syndrome)   . Diverticulosis   . Hiatal hernia     Family History  Problem Relation Age of Onset  . Cancer Mother     lung  . Heart disease Father   . Autism Son   . Cancer Maternal Uncle     colon  . Cancer Maternal Grandmother     breast     History   Social History  . Marital Status: Married    Spouse Name: N/A    Number of Children: N/A  . Years of Education: N/A   Social History Main Topics  . Smoking status: Never Smoker   . Smokeless tobacco: None  . Alcohol Use: Yes     Comment: Social Drinker  . Drug Use: None  . Sexually Active: None   Other Topics Concern  . None   Social History Narrative   Married HHof 4 one sone autisticHusband had prostate cancer in last few yearsSome exercise  Runs No tobacco    Outpatient Encounter Prescriptions  as of 08/20/2012  Medication Sig Dispense Refill  . ALPRAZolam (XANAX) 0.25 MG tablet Take 1 tablet (0.25 mg total) by mouth at bedtime as needed for sleep.  30 tablet  1  . buPROPion (WELLBUTRIN XL) 150 MG 24 hr tablet Take 1 tablet (150 mg total) by mouth every morning.  90 tablet  3  . calcium-vitamin D (OSCAL WITH D) 500-200 MG-UNIT per tablet Take 1 tablet by mouth daily.      . Multiple Vitamin (MULTIVITAMIN) tablet Take 1 tablet by mouth daily.      Maxwell Caul Bicarbonate (ZEGERID) 20-1100 MG CAPS Take 1 capsule by mouth daily before breakfast.      . vitamin E 400 UNIT capsule Take 400 Units by mouth daily.      . cefUROXime (CEFTIN) 500 MG tablet Take 1 tablet (500 mg total) by mouth 2 (two) times daily.  20 tablet  0  . ciprofloxacin (CIPRO) 500 MG tablet Take 1 tablet (500 mg total) by mouth 2 (two) times daily.  20 tablet  0  . metroNIDAZOLE (FLAGYL) 500 MG tablet Take 1 tablet (  500 mg total) by mouth 3 (three) times daily.  21 tablet  0    EXAM:  BP 100/80  Pulse 94  Temp 98 F (36.7 C) (Oral)  Wt 105 lb (47.628 kg)  SpO2 98%  LMP 07/25/2011  There is no height on file to calculate BMI. tired non toxic   GENERAL: vitals reviewed and listed above, alert, oriented, appears well hydrated and in no acute distress  HEENT: atraumatic, conjunctiva  clear, no obvious abnormalities on inspection of external nose and ears OP : no lesion edema or exudate  NECK: no obvious masses on inspection palpation  LUNGS: clear to auscultation bilaterally, no wheezes, rales or rhonchi, good air movement Skin: normal capillary refill ,turgor , color: No acute rashes ,petechiae or bruising CV: HRRR, no clubbing cyanosis or  peripheral edema nl cap refill  ABD:  bs inc? Left side pain with guard no rebound or mass effect  MS: moves all extremities without noticeable focal  abnormality Neuro appears nonfocal PSYCH: pleasant and cooperative, no obvious depression or anxiety  ASSESSMENT  AND PLAN:  Discussed the following assessment and plan:  1. Acute diverticulitis   2. Left sided abdominal pain    no fever or peritoneal signs except for localized guarding. Began antibiotic Cipro and Flagyl for now keep appointment tomorrow with GI. Contact on-call team for alarm symptoms.  -Patient advised to return or notify health care team  if symptoms worsen or persist or new concerns arise.  Patient Instructions  I  Agree  That this is probably diverticulitis attack . Take both antibiotics as tolerated . Keep appt with gi to reestablish tomorrow. Contact on call service if worsening or advice. \\clear liquids at this time .     Diverticulitis A diverticulum is a small pouch or sac on the colon. Diverticulosis is the presence of these diverticula on the colon. Diverticulitis is the irritation (inflammation) or infection of diverticula. CAUSES  The colon and its diverticula contain bacteria. If food particles block the tiny opening to a diverticulum, the bacteria inside can grow and cause an increase in pressure. This leads to infection and inflammation and is called diverticulitis. SYMPTOMS   Abdominal pain and tenderness. Usually, the pain is located on the left side of your abdomen. However, it could be located elsewhere.  Fever.  Bloating.  Feeling sick to your stomach (nausea).  Throwing up (vomiting).  Abnormal stools. DIAGNOSIS  Your caregiver will take a history and perform a physical exam. Since many things can cause abdominal pain, other tests may be necessary. Tests may include:  Blood tests.  Urine tests.  X-ray of the abdomen.  CT scan of the abdomen. Sometimes, surgery is needed to determine if diverticulitis or other conditions are causing your symptoms. TREATMENT  Most of the time, you can be treated without surgery. Treatment includes:  Resting the bowels by only having liquids for a few days. As you improve, you will need to eat a low-fiber  diet.  Intravenous (IV) fluids if you are losing body fluids (dehydrated).  Antibiotic medicines that treat infections may be given.  Pain and nausea medicine, if needed.  Surgery if the inflamed diverticulum has burst. HOME CARE INSTRUCTIONS   Try a clear liquid diet (broth, tea, or water for as long as directed by your caregiver). You may then gradually begin a low-fiber diet as tolerated. A low-fiber diet is a diet with less than 10 grams of fiber. Choose the foods below to reduce fiber in  the diet:  White breads, cereals, rice, and pasta.  Cooked fruits and vegetables or soft fresh fruits and vegetables without the skin.  Ground or well-cooked tender beef, ham, veal, lamb, pork, or poultry.  Eggs and seafood.  After your diverticulitis symptoms have improved, your caregiver may put you on a high-fiber diet. A high-fiber diet includes 14 grams of fiber for every 1000 calories consumed. For a standard 2000 calorie diet, you would need 28 grams of fiber. Follow these diet guidelines to help you increase the fiber in your diet. It is important to slowly increase the amount fiber in your diet to avoid gas, constipation, and bloating.  Choose whole-grain breads, cereals, pasta, and brown rice.  Choose fresh fruits and vegetables with the skin on. Do not overcook vegetables because the more vegetables are cooked, the more fiber is lost.  Choose more nuts, seeds, legumes, dried peas, beans, and lentils.  Look for food products that have greater than 3 grams of fiber per serving on the Nutrition Facts label.  Take all medicine as directed by your caregiver.  If your caregiver has given you a follow-up appointment, it is very important that you go. Not going could result in lasting (chronic) or permanent injury, pain, and disability. If there is any problem keeping the appointment, call to reschedule. SEEK MEDICAL CARE IF:   Your pain does not improve.  You have a hard time advancing  your diet beyond clear liquids.  Your bowel movements do not return to normal. SEEK IMMEDIATE MEDICAL CARE IF:   Your pain becomes worse.  You have an oral temperature above 102 F (38.9 C), not controlled by medicine.  You have repeated vomiting.  You have bloody or black, tarry stools.  Symptoms that brought you to your caregiver become worse or are not getting better. MAKE SURE YOU:   Understand these instructions.  Will watch your condition.  Will get help right away if you are not doing well or get worse. Document Released: 04/19/2005 Document Revised: 10/02/2011 Document Reviewed: 08/15/2010 [facility] Patient Information 2013 [place], [place].      Neta Mends. Panosh M.D.

## 2012-08-20 NOTE — Telephone Encounter (Signed)
Made an appt with gastro for 08/21/12 @ 11am.  Pt coming to see WP today at 1:30 for evaluation and to start antibiotics.  Instructed to go to ED if vomiting or pain worsened.  Pt notified of appt with gastro.

## 2012-08-20 NOTE — Telephone Encounter (Signed)
Patient Information:  Caller Name: Jessica Chan  Phone: [phone]  Patient: Jessica Chan  Gender: Female  DOB: [birth year]  Age: 52 Years  PCP: Jessica Chan [facility])  Pregnant: No  Office Follow Up:  Does the office need to follow up with this patient?: Yes  Instructions For The Office: please call and advise   Symptoms  Reason For Call & Symptoms: Pt had an incident of diverticulitis last night. Pt is followed for this by the G.I. MD and Dr. Fabian Sharp. Pt manages her diet meticulously. Last night she developed abdominal pain in the spot where she usually has it - left midline abdomen. Pt is nauseated from the pain/has vomited small amount Spouse is calling to see if MD would call in protocol for this. (Cipro). Pt will f/u with an appt with G.I. MD.  Reviewed Health History In EMR: Yes  Reviewed Medications In EMR: Yes  Reviewed Allergies In EMR: Yes  Reviewed Surgeries / Procedures: Yes  Date of Onset of Symptoms: 08/19/2012  Treatments Tried: fluids and rest  Treatments Tried Worked: No OB / GYN:  LMP: Unknown  Guideline(s) Used:  Abdominal Pain - Female  Disposition Per Guideline:   Go to ED Now  Reason For Disposition Reached:   Severe abdominal pain (e.g., excruciating)  Advice Given:  N/A  Patient Refused Recommendation:  Patient Requests Prescription  Spouse is asking for MD to call in abx regime for diverticulitis episode. He will then schedule pt with G.I. MD. [facility] [address]

## 2012-08-21 ENCOUNTER — Ambulatory Visit: Payer: BC Managed Care – PPO | Admitting: Physician Assistant

## 2012-08-30 ENCOUNTER — Encounter: Payer: Self-pay | Admitting: Physician Assistant

## 2012-08-30 ENCOUNTER — Ambulatory Visit (INDEPENDENT_AMBULATORY_CARE_PROVIDER_SITE_OTHER): Payer: BC Managed Care – PPO | Admitting: Physician Assistant

## 2012-08-30 VITALS — BP 102/68 | HR 71 | Ht 65.0 in | Wt 109.2 lb

## 2012-08-30 DIAGNOSIS — K5792 Diverticulitis of intestine, part unspecified, without perforation or abscess without bleeding: Secondary | ICD-10-CM

## 2012-08-30 DIAGNOSIS — K5732 Diverticulitis of large intestine without perforation or abscess without bleeding: Secondary | ICD-10-CM

## 2012-08-30 MED ORDER — METRONIDAZOLE 500 MG PO TABS: 500.0000 mg | ORAL_TABLET | Freq: Two times a day (BID) | ORAL | Status: DC

## 2012-08-30 MED ORDER — CIPROFLOXACIN HCL 500 MG PO TABS: 500.0000 mg | ORAL_TABLET | Freq: Two times a day (BID) | ORAL | Status: DC

## 2012-08-30 NOTE — Progress Notes (Signed)
Agree with Ms. Esterwood's assessment and plan. Carl E. Gessner, MD, FACG   

## 2012-08-30 NOTE — Patient Instructions (Signed)
Finish current antibiotics.  Please start Align. This puts good bacteria back into your colon. You should take 1 capsule by mouth once daily. If this works well for you, it can be purchased over the counter.   Refills for Cipro and Flagyl have been sent to your pharmacy in case you get another flare up.

## 2012-08-30 NOTE — Progress Notes (Signed)
Subjective:    Patient ID: Jessica Chan, female    DOB: 1961/07/12, 52 y.o.   MRN: [id number]  HPI Jessica Chan is a very nice 52 year old white female known to Dr. Leone Payor who has history of recurrent diverticulitis. Her last colonoscopy was done in June of 2010 and showed moderate diverticular disease from the transverse colon to the sigmoid colon. She has had several episodes of diverticulitis over the past years and actually had been referred to Dr. gross to consider resection about 2-1/2 years ago after she had gone through a period of time with several episodes. She says that her husband was diagnosed with prostate cancer and had to have surgery right around the same time she was found to have surgery, and she canceled hers and did not reschedule . She says actually over the past 2 years she has only had one episode per year and has been very judicious about watching her diet which she feels has helped. She did develop a recurrent episode about 2 weeks ago , and was seen by Dr. Elliot Gurney and started on a course of Cipro and Flagyl. At this point she has been on and miotic 48 days and says that her symptoms have completely resolved. It was suggested she come in for followup . Her bowel movements have been normal no melena or hematochezia. She wants to know if there's anything  she can do to prevent recurrent episodes.      Review of Systems  Constitutional: Negative.   HENT: Negative.   Eyes: Negative.   Respiratory: Negative.   Cardiovascular: Negative.   Gastrointestinal: Positive for abdominal pain.  Genitourinary: Negative.   Musculoskeletal: Negative.   Neurological: Negative.   Hematological: Negative.   Psychiatric/Behavioral: Negative.    Outpatient Prescriptions Prior to Visit  Medication Sig Dispense Refill  . ALPRAZolam (XANAX) 0.25 MG tablet Take 1 tablet (0.25 mg total) by mouth at bedtime as needed for sleep.  30 tablet  1  . buPROPion (WELLBUTRIN XL) 150 MG 24 hr tablet Take 1  tablet (150 mg total) by mouth every morning.  90 tablet  3  . calcium-vitamin D (OSCAL WITH D) 500-200 MG-UNIT per tablet Take 1 tablet by mouth daily.      . Multiple Vitamin (MULTIVITAMIN) tablet Take 1 tablet by mouth daily.      . vitamin E 400 UNIT capsule Take 400 Units by mouth daily.      . [DISCONTINUED] ciprofloxacin (CIPRO) 500 MG tablet Take 1 tablet (500 mg total) by mouth 2 (two) times daily.  20 tablet  0  . [DISCONTINUED] metroNIDAZOLE (FLAGYL) 500 MG tablet Take 1 tablet (500 mg total) by mouth 3 (three) times daily.  21 tablet  0  . [DISCONTINUED] Omeprazole-Sodium Bicarbonate (ZEGERID) 20-1100 MG CAPS Take 1 capsule by mouth daily before breakfast.       Last reviewed on 08/30/2012  1:40 PM by Sammuel Cooper, PA      Allergies  Allergen Reactions  . Amoxicillin-Pot Clavulanate     REACTION: Nausea  . Meperidine Hcl    Patient Active Problem List  Diagnosis  . HEMANGIOMA, HEPATIC  . ADJUSTMENT DISORDER WITH DEPRESSED MOOD  . ALLERGIC RHINITIS  . ESOPHAGEAL STRICTURE  . GERD  . HIATAL HERNIA  . DIVERTICULOSIS, COLON  . SLEEP DISORDER/DISTURBANCE  . NEPHROLITHIASIS, HX OF  . BREAST MASS, RIGHT  . Adjustment reaction with anxiety and depression  . Preventive measure   History  Substance Use Topics  . Smoking  status: Never Smoker   . Smokeless tobacco: Not on file  . Alcohol Use: Yes     Comment: Social Drinker   family history includes Autism in her son; Cancer in her maternal grandmother, maternal uncle, and mother; and Heart disease in her father.  Objective:   Physical Exam well-developed thin white female in no acute distress, pleasant blood pressure 102/68 pulse 70 one height 5 foot 5 weight 109. HEENT; nontraumatic normocephalic EOMI PERRLA sclera anicteric, Neck;Supple no JVD, Cardiovascular; regular rate and rhythm with S1-S2 no murmur or gallop, Pulmonary; clear bilaterally, Abdomen; soft basically nontender there is no palpable mass or  hepatosplenomegaly no guarding or rebound bowel sounds are active, Rectal; exam not done, Extremities; no clubbing cyanosis or edema skin warm and dry, Psych; mood and affect normal and appropriate        Assessment & Plan:  #17 52 year old female with recurrent diverticulitis. Current episode resolved after an eight-day course of Cipro and Flagyl. She has had several episodes of diverticulitis over the past 5 years but has actually done well over the past 2 years.  Plan; she will finish her current course of Cipro and Flagyl and have called and one refill for Cipro and Flagyl should she have a recurrence. We discussed surgical referral-given the fact that she has done very well over the past couple of years do not feel that resection is imperative at this time and will follow her course over the next year. She will continue high-fiber diet, liberal fluids and avoidance of seed products and nuts Discussed addition of a probiotic She will call for any recurrent episodes.

## 2012-12-09 ENCOUNTER — Other Ambulatory Visit: Payer: Self-pay | Admitting: Internal Medicine

## 2012-12-09 NOTE — Telephone Encounter (Signed)
Pt called to follow up on refill request. Pt has to go out of town [employer] and is flying. Pt states she must have that ALPRAZolam (XANAX) 0.25 MG tablet to fly and needs refilled as soon as possible.  [facility] [address]

## 2013-02-24 ENCOUNTER — Telehealth: Payer: Self-pay | Admitting: Internal Medicine

## 2013-02-24 MED ORDER — PREDNISONE 10 MG PO TABS: ORAL_TABLET | ORAL | Status: DC

## 2013-02-24 NOTE — Telephone Encounter (Signed)
Left message on below listed number for the pt to return my call.

## 2013-02-24 NOTE — Telephone Encounter (Signed)
Ok to do but confirm that no fever  Vesicles  Cold sore type rash .  12 days pred

## 2013-02-24 NOTE — Telephone Encounter (Signed)
Patient Information:  Caller Name: Jessica Chan  Phone: [phone]  Patient: Jessica Chan, Jessica Chan  Gender: Female  DOB: May 29, 1961  Age: 52 Years  PCP: Berniece Andreas [facility])  Pregnant: No  Office Follow Up:  Does the office need to follow up with this patient?: Yes  Instructions For The Office: PLEASE CALL PT BACK AT HER REQUEST AND LET HER KNOW IF PREDNISONE CAN BE CALLED IN TO [facility] [address], [place], [phone].  RN Note:  PLEASE CALL PT BACK AT HER REQUEST AND LET HER KNOW IF PREDNISONE CAN BE CALLED IN TO [facility] [address] [place], [phone].  Symptoms  Reason For Call & Symptoms: Pt calling regarding poison ivy on hands, ears, face and back of neck. Itching. Requesting Prednisone be called in. Explained that pt needed to be seen. She is out of town and has company. Advised she still needed to be seen. Pt insisted a note be sent.  Reviewed Health History In EMR: Yes  Reviewed Medications In EMR: Yes  Reviewed Allergies In EMR: Yes  Reviewed Surgeries / Procedures: Yes  Date of Onset of Symptoms: 02/22/2013 OB / GYN:  LMP: Unknown  Guideline(s) Used:  Poison Ivy - [place] or [place]  Disposition Per Guideline:   Go to Office Now  Reason For Disposition Reached:   Increasing redness around poison ivy and larger than 2 inches (5 cm)  Advice Given:  N/A  Patient Will Follow Care Advice:  YES

## 2013-02-24 NOTE — Telephone Encounter (Signed)
Medication sent per [facility].

## 2013-02-25 NOTE — Telephone Encounter (Signed)
Left message at below listed number for the pt to return my call. 

## 2013-02-26 ENCOUNTER — Encounter: Payer: Self-pay | Admitting: Family Medicine

## 2013-02-26 NOTE — Telephone Encounter (Signed)
Left message at below listed number for the pt to return my call.  Have attempted to call several times.  Will now send a contact letter.

## 2013-06-09 ENCOUNTER — Other Ambulatory Visit: Payer: Self-pay | Admitting: Family Medicine

## 2013-06-09 ENCOUNTER — Telehealth: Payer: Self-pay | Admitting: Internal Medicine

## 2013-06-09 MED ORDER — ALPRAZOLAM 0.25 MG PO TABS: ORAL_TABLET | ORAL | Status: DC

## 2013-06-09 NOTE — Telephone Encounter (Signed)
Pt needs refill on xanax call into [facility] [address]. Pt is travelling abroad for 8days.

## 2013-06-09 NOTE — Telephone Encounter (Signed)
Called and left on voicemail. 

## 2013-06-09 NOTE — Telephone Encounter (Signed)
Ok x 1 refill

## 2013-06-09 NOTE — Telephone Encounter (Signed)
Last filed on 12/09/12 #30 with 1 additional refill Has no future appointment scheduled. Last seen on 08/20/12 Please advise. Thanks!

## 2013-08-08 LAB — HM MAMMOGRAPHY

## 2013-08-13 ENCOUNTER — Encounter: Payer: Self-pay | Admitting: Internal Medicine

## 2013-09-08 ENCOUNTER — Encounter: Payer: Self-pay | Admitting: Internal Medicine

## 2013-10-01 ENCOUNTER — Telehealth: Payer: Self-pay | Admitting: Internal Medicine

## 2013-10-01 NOTE — Telephone Encounter (Signed)
Patient states that the pain has resolved, but pain was in another spot.  She will come in and see Dr. Carlean Purl tomorrow at 8:30

## 2013-10-02 ENCOUNTER — Encounter: Payer: Self-pay | Admitting: Internal Medicine

## 2013-10-02 ENCOUNTER — Ambulatory Visit (INDEPENDENT_AMBULATORY_CARE_PROVIDER_SITE_OTHER): Payer: BC Managed Care – PPO | Admitting: Internal Medicine

## 2013-10-02 VITALS — BP 90/60 | HR 60 | Ht 65.0 in | Wt 112.8 lb

## 2013-10-02 DIAGNOSIS — K5732 Diverticulitis of large intestine without perforation or abscess without bleeding: Secondary | ICD-10-CM

## 2013-10-02 HISTORY — DX: Diverticulitis of large intestine without perforation or abscess without bleeding: K57.32

## 2013-10-02 MED ORDER — CIPROFLOXACIN HCL 500 MG PO TABS: 500.0000 mg | ORAL_TABLET | Freq: Two times a day (BID) | ORAL | Status: DC

## 2013-10-02 MED ORDER — METRONIDAZOLE 500 MG PO TABS: 500.0000 mg | ORAL_TABLET | Freq: Two times a day (BID) | ORAL | Status: DC

## 2013-10-02 NOTE — Assessment & Plan Note (Signed)
Scenario consistent with a repeat episode - seems resolved, slight RLQ tenderness today noted. Plan to observe - if that fails to improve or recurrent attacks she is to let me know - she will be given an Rx for cipro and metronidazole to have on hand and is to notify me if she needs to use it. Elective surgery still an option. She will let me know but not interested now. Explained that may also have symptomatic diverticulosis at times.

## 2013-10-02 NOTE — Progress Notes (Signed)
         Subjective:    Patient ID: Jessica Chan, female    DOB: 27-Mar-1961, 53 y.o.   MRN: [id number]  HPI Cyrilla is here to discuss recurrent diverticulitis. She has had problems over the years and was travelling in [place] to ski last month and developed LLQ pain and bloating similar to other episodes of diverticulitis - except pain was somewhat lower in abdomen. She took cipro and metronidazole that she had on hand in case of attack and felt much better after 3 days and now is back to baseline as far as defecation, pain and bloating. Last attack prior was Feb 2014. No rectal bleeding. She has pursued elective resection in past - 2011 - but deferred due to husband's prostate cancer and is now not interested.  Medications, allergies, past medical history, past surgical history, family history and social history are reviewed and updated in the EMR.  Review of Systems As above    Objective:   Physical Exam WDWN NAD Thin wf Abdomen scaphoid with slight RLQ tenderness but otherwise soft, benign and nontedner BS +    Assessment & Plan:  Diverticulitis of colon - recurrent Scenario consistent with a repeat episode - seems resolved, slight RLQ tenderness today noted. Plan to observe - if that fails to improve or recurrent attacks she is to let me know - she will be given an Rx for cipro and metronidazole to have on hand and is to notify me if she needs to use it. Elective surgery still an option. She will let me know but not interested now. Explained that may also have symptomatic diverticulosis at times.

## 2013-10-02 NOTE — Patient Instructions (Signed)
We have sent the following medications to your pharmacy for you to pick up at your convenience: Cipro and Flagyl.  We are sending these in for you to have on hand, if you have a flare and have to use the medicine call and let us know.  Follow up with [place] as needed.    I appreciate the opportunity to care for you.

## 2013-11-13 ENCOUNTER — Other Ambulatory Visit: Payer: Self-pay | Admitting: Internal Medicine

## 2013-11-27 ENCOUNTER — Ambulatory Visit (INDEPENDENT_AMBULATORY_CARE_PROVIDER_SITE_OTHER): Payer: BC Managed Care – PPO | Admitting: Internal Medicine

## 2013-11-27 ENCOUNTER — Encounter: Payer: Self-pay | Admitting: Internal Medicine

## 2013-11-27 VITALS — BP 116/74 | HR 72 | Temp 97.9°F | Ht 65.25 in | Wt 110.0 lb

## 2013-11-27 DIAGNOSIS — F4323 Adjustment disorder with mixed anxiety and depressed mood: Secondary | ICD-10-CM

## 2013-11-27 DIAGNOSIS — G479 Sleep disorder, unspecified: Secondary | ICD-10-CM

## 2013-11-27 DIAGNOSIS — N959 Unspecified menopausal and perimenopausal disorder: Secondary | ICD-10-CM

## 2013-11-27 DIAGNOSIS — Z79899 Other long term (current) drug therapy: Secondary | ICD-10-CM

## 2013-11-27 DIAGNOSIS — M5430 Sciatica, unspecified side: Secondary | ICD-10-CM | POA: Insufficient documentation

## 2013-11-27 MED ORDER — BUPROPION HCL ER (XL) 150 MG PO TB24: 150.0000 mg | ORAL_TABLET | ORAL | Status: DC

## 2013-11-27 MED ORDER — ALPRAZOLAM 0.25 MG PO TABS: ORAL_TABLET | ORAL | Status: DC

## 2013-11-27 NOTE — Progress Notes (Signed)
Chief Complaint  Patient presents with  . Follow-up    Patient complains of sciatic pain.  Needs refills of Xanax and Wellbutrin    HPI: Jessica Chan  comes in today for follow up of medicine  menagment   Last ov was 1 14  Sciatica: no specific injury but  Walking  A lot   Tight after  Walking  But power washing and bending and lifting ans soon after in lbp sithe rad to buttocks   Worse  Sitting to standing  Ok to walk no numbness or weakness  No hx of injury. Remote hx fall horse  And fell slip at [place] .  MOOD:   wellbutrin  Ok to try going off.  [place] bout this only taking 150  Using alpraz mostly for sleep  Since no periods for 2 years some hot flushes not too bad but sleep is off  alpraz using as needed for menopause and sleep.  2-3 per week or less.    Has had fu with Dr Carlean Purl  Recurrent diverticulitis considering bowel resection.  ROS: See pertinent positives and negatives per HPI. No cp sob  syncope   Past Medical History  Diagnosis Date  . Allergic rhinitis   . GERD (gastroesophageal reflux disease)   . Hemangioma of liver     incidental finding-large, right and left lobes of liver   . Anxiety disorder   . Depression   . IBS (irritable bowel syndrome)   . Diverticulosis   . Hiatal hernia   . Sinusitis, acute maxillary 07/29/2012    right  hx of sinus surgery  fu if needed recurrecne    . BLEPHARITIS 05/27/2007    Qualifier: Diagnosis of  By: Hulan Saas, CMA (AAMA), [place]   . Diverticulitis   . Diverticulitis of colon - recurrent 10/02/2013  . ESOPHAGEAL STRICTURE 11/15/2009    Qualifier: Diagnosis of  By: Shane Crutch, Amy S     Family History  Problem Relation Age of Onset  . Lung cancer Mother   . Heart disease Father   . Autism Son   . Colon cancer Maternal Uncle   . Breast cancer Maternal Grandmother     History   Social History  . Marital Status: Married    Spouse Name: N/A    Number of Children: 2  . Years of Education: N/A   Occupational  History  . Caregiver    Social History Main Topics  . Smoking status: Never Smoker   . Smokeless tobacco: Never Used  . Alcohol Use: Yes     Comment: Social Drinker  . Drug Use: No  . Sexual Activity: None   Other Topics Concern  . None   Social History Narrative   Married HHof 4 one sone autistic   Husband had prostate cancer in last few years   Some exercise  Runs    No tobacco    Outpatient Encounter Prescriptions as of 11/27/2013  Medication Sig  . ALPRAZolam (XANAX) 0.25 MG tablet take 1 tablet by mouth if needed  . buPROPion (WELLBUTRIN XL) 150 MG 24 hr tablet Take 1 tablet (150 mg total) by mouth every morning.  . calcium-vitamin D (OSCAL WITH D) 500-200 MG-UNIT per tablet Take 1 tablet by mouth daily.  . Multiple Vitamin (MULTIVITAMIN) tablet Take 1 tablet by mouth daily.  . [DISCONTINUED] ALPRAZolam (XANAX) 0.25 MG tablet take 1 tablet by mouth at bedtime if needed for sleep  . [DISCONTINUED] buPROPion (WELLBUTRIN XL) 150  MG 24 hr tablet Take 1 tablet (150 mg total) by mouth every morning.  . [DISCONTINUED] buPROPion (WELLBUTRIN XL) 150 MG 24 hr tablet Take 1 tablet (150 mg total) by mouth every morning.  . [DISCONTINUED] ciprofloxacin (CIPRO) 500 MG tablet Take 1 tablet (500 mg total) by mouth 2 (two) times daily.  . [DISCONTINUED] metroNIDAZOLE (FLAGYL) 500 MG tablet Take 1 tablet (500 mg total) by mouth 2 (two) times daily.    EXAM:  BP 116/74  Pulse 72  Temp(Src) 97.9 F (36.6 C)  Ht 5' 5.25" (1.657 m)  Wt 110 lb (49.896 kg)  BMI 18.17 kg/m2  LMP 07/25/2011  Body mass index is 18.17 kg/(m^2).  GENERAL: vitals reviewed and listed above, alert, oriented, appears well hydrated and in no acute distress painful back when arising from table otherwise gait ok  HEENT: atraumatic, conjunctiva  clear, no obvious abnormalities on inspection of external nose and ears  NECK: no obvious masses on inspection palpation  LUNGS: clear to auscultation bilaterally, no  wheezes, rales or rhonchi, good air movement CV: HRRR, no clubbing cyanosis or  peripheral edema nl cap refill  abd soft no masses  Back st non focal pain points to ls si area  Neg slr dtrs intact and no weakness  MS: moves all extremities without noticeable focal  abnormality PSYCH: pleasant and cooperative, no obvious depression or anxiety Lab Results  Component Value Date   WBC 4.4* 12/28/2011   HGB 13.0 12/28/2011   HCT 38.6 12/28/2011   PLT 237.0 12/28/2011   GLUCOSE 57* 12/28/2011   CHOL 167 12/28/2011   TRIG 80.0 12/28/2011   HDL 56.30 12/28/2011   LDLCALC 95 12/28/2011   ALT 13 12/28/2011   AST 19 12/28/2011   NA 142 12/28/2011   K 4.0 12/28/2011   CL 104 12/28/2011   CREATININE 0.7 12/28/2011   BUN 17 12/28/2011   CO2 29 12/28/2011   TSH 2.57 12/28/2011    ASSESSMENT AND PLAN:  Discussed the following assessment and plan:  Sciatica  Sleep disturbance, unspecified  Menopausal and perimenopausal disorder - Plan: Ambulatory referral to Gynecology  Adjustment reaction with anxiety and depression - much better and ok to wean off med  as tolerated   Medication management Now seems to be using the alprazolam for possibly perimenopausal symptoms. She may be a better candidate for replacement therapy based on her symptom complex in your onset. Would've eyes to not use alprazolam on a regular basis for sleep.  She is overdue for a GYN checkup anyway will set up a referral about advisability of medication for her menopausal symptoms and routine GYN exam Expectant management wellness exam in 6 months. -Patient advised to return or notify health care team  if symptoms worsen ,persist or new concerns arise.  Patient Instructions  The sleep issue may be menopausal and would avoid xanax use on a regular basis for this ? If hrt or other would be temporarily helpful   For sleep and hot flushes  You also need pap and exam etc.   Can  Wean the Wellbutrin  Every other day  Or such for refill today.  Back   Exercises and time may be helpful.when not hurting to do them .     Sciatica with Rehab The sciatic nerve runs from the back down the leg and is responsible for sensation and control of the muscles in the back (posterior) side of the thigh, lower leg, and foot. Sciatica is a condition that is  characterized by inflammation of this nerve.  SYMPTOMS   Signs of nerve damage, including numbness and/or weakness along the posterior side of the lower extremity.  Pain in the back of the thigh that may also travel down the leg.  Pain that worsens when sitting for long periods of time.  Occasionally, pain in the back or buttock. CAUSES  Inflammation of the sciatic nerve is the cause of sciatica. The inflammation is due to something irritating the nerve. Common sources of irritation include:  Sitting for long periods of time.  Direct trauma to the nerve.  Arthritis of the spine.  Herniated or ruptured disk.  Slipping of the vertebrae (spondylolithesis)  Pressure from soft tissues, such as muscles or ligament-like tissue (fascia). RISK INCREASES WITH:  Sports that place pressure or stress on the spine (football or weightlifting).  Poor strength and flexibility.  Failure to warm-up properly before activity.  Family history of low back pain or disk disorders.  Previous back injury or surgery.  Poor body mechanics, especially when lifting, or poor posture. PREVENTION   Warm up and stretch properly before activity.  Maintain physical fitness:  Strength, flexibility, and endurance.  Cardiovascular fitness.  Learn and use proper technique, especially with posture and lifting. When possible, have coach correct improper technique.  Avoid activities that place stress on the spine. PROGNOSIS If treated properly, then sciatica usually resolves within 6 weeks. However, occasionally surgery is necessary.  RELATED COMPLICATIONS   Permanent nerve damage, including pain, numbness,  tingle, or weakness.  Chronic back pain.  Risks of surgery: infection, bleeding, nerve damage, or damage to surrounding tissues. TREATMENT Treatment initially involves resting from any activities that aggravate your symptoms. The use of ice and medication may help reduce pain and inflammation. The use of strengthening and stretching exercises may help reduce pain with activity. These exercises may be performed at home or with referral to a therapist. A therapist may recommend further treatments, such as transcutaneous electronic nerve stimulation (TENS) or ultrasound. Your caregiver may recommend corticosteroid injections to help reduce inflammation of the sciatic nerve. If symptoms persist despite non-surgical (conservative) treatment, then surgery may be recommended. MEDICATION  If pain medication is necessary, then nonsteroidal anti-inflammatory medications, such as aspirin and ibuprofen, or other minor pain relievers, such as acetaminophen, are often recommended.  Do not take pain medication for 7 days before surgery.  Prescription pain relievers may be given if deemed necessary by your caregiver. Use only as directed and only as much as you need.  Ointments applied to the skin may be helpful.  Corticosteroid injections may be given by your caregiver. These injections should be reserved for the most serious cases, because they may only be given a certain number of times. HEAT AND COLD  Cold treatment (icing) relieves pain and reduces inflammation. Cold treatment should be applied for 10 to 15 minutes every 2 to 3 hours for inflammation and pain and immediately after any activity that aggravates your symptoms. Use ice packs or massage the area with a piece of ice (ice massage).  Heat treatment may be used prior to performing the stretching and strengthening activities prescribed by your caregiver, physical therapist, or athletic trainer. Use a heat pack or soak the injury in warm  water. SEEK MEDICAL CARE IF:  Treatment seems to offer no benefit, or the condition worsens.  Any medications produce adverse side effects. EXERCISES  RANGE OF MOTION (ROM) AND STRETCHING EXERCISES - Sciatica Most people with sciatic will find that their  symptoms worsen with either excessive bending forward (flexion) or arching at the low back (extension). The exercises which will help resolve your symptoms will focus on the opposite motion. Your physician, physical therapist or athletic trainer will help you determine which exercises will be most helpful to resolve your low back pain. Do not complete any exercises without first consulting with your clinician. Discontinue any exercises which worsen your symptoms until you speak to your clinician. If you have pain, numbness or tingling which travels down into your buttocks, leg or foot, the goal of the therapy is for these symptoms to move closer to your back and eventually resolve. Occasionally, these leg symptoms will get better, but your low back pain may worsen; this is typically an indication of progress in your rehabilitation. Be certain to be very alert to any changes in your symptoms and the activities in which you participated in the 24 hours prior to the change. Sharing this information with your clinician will allow him/her to most efficiently treat your condition. These exercises may help you when beginning to rehabilitate your injury. Your symptoms may resolve with or without further involvement from your physician, physical therapist or athletic trainer. While completing these exercises, remember:   Restoring tissue flexibility helps normal motion to return to the joints. This allows healthier, less painful movement and activity.  An effective stretch should be held for at least 30 seconds.  A stretch should never be painful. You should only feel a gentle lengthening or release in the stretched tissue. FLEXION RANGE OF MOTION AND  STRETCHING EXERCISES: STRETCH  Flexion, Single Knee to Chest   Lie on a firm bed or floor with both legs extended in front of you.  Keeping one leg in contact with the floor, bring your opposite knee to your chest. Hold your leg in place by either grabbing behind your thigh or at your knee.  Pull until you feel a gentle stretch in your low back. Hold __________ seconds.  Slowly release your grasp and repeat the exercise with the opposite side. Repeat __________ times. Complete this exercise __________ times per day.  STRETCH  Flexion, Double Knee to Chest  Lie on a firm bed or floor with both legs extended in front of you.  Keeping one leg in contact with the floor, bring your opposite knee to your chest.  Tense your stomach muscles to support your back and then lift your other knee to your chest. Hold your legs in place by either grabbing behind your thighs or at your knees.  Pull both knees toward your chest until you feel a gentle stretch in your low back. Hold __________ seconds.  Tense your stomach muscles and slowly return one leg at a time to the floor. Repeat __________ times. Complete this exercise __________ times per day.  STRETCH  Low Trunk Rotation   Lie on a firm bed or floor. Keeping your legs in front of you, bend your knees so they are both pointed toward the ceiling and your feet are flat on the floor.  Extend your arms out to the side. This will stabilize your upper body by keeping your shoulders in contact with the floor.  Gently and slowly drop both knees together to one side until you feel a gentle stretch in your low back. Hold for __________ seconds.  Tense your stomach muscles to support your low back as you bring your knees back to the starting position. Repeat the exercise to the other side. Repeat __________  times. Complete this exercise __________ times per day  EXTENSION RANGE OF MOTION AND FLEXIBILITY EXERCISES: STRETCH  Extension, Prone on  Elbows  Lie on your stomach on the floor, a bed will be too soft. Place your palms about shoulder width apart and at the height of your head.  Place your elbows under your shoulders. If this is too painful, stack pillows under your chest.  Allow your body to relax so that your hips drop lower and make contact more completely with the floor.  Hold this position for __________ seconds.  Slowly return to lying flat on the floor. Repeat __________ times. Complete this exercise __________ times per day.  RANGE OF MOTION  Extension, Prone Press Ups  Lie on your stomach on the floor, a bed will be too soft. Place your palms about shoulder width apart and at the height of your head.  Keeping your back as relaxed as possible, slowly straighten your elbows while keeping your hips on the floor. You may adjust the placement of your hands to maximize your comfort. As you gain motion, your hands will come more underneath your shoulders.  Hold this position __________ seconds.  Slowly return to lying flat on the floor. Repeat __________ times. Complete this exercise __________ times per day.  STRENGTHENING EXERCISES - Sciatica  These exercises may help you when beginning to rehabilitate your injury. These exercises should be done near your "sweet spot." This is the neutral, low-back arch, somewhere between fully rounded and fully arched, that is your least painful position. When performed in this safe range of motion, these exercises can be used for people who have either a flexion or extension based injury. These exercises may resolve your symptoms with or without further involvement from your physician, physical therapist or athletic trainer. While completing these exercises, remember:   Muscles can gain both the endurance and the strength needed for everyday activities through controlled exercises.  Complete these exercises as instructed by your physician, physical therapist or athletic trainer.  Progress with the resistance and repetition exercises only as your caregiver advises.  You may experience muscle soreness or fatigue, but the pain or discomfort you are trying to eliminate should never worsen during these exercises. If this pain does worsen, stop and make certain you are following the directions exactly. If the pain is still present after adjustments, discontinue the exercise until you can discuss the trouble with your clinician. STRENGTHENING Deep Abdominals, Pelvic Tilt   Lie on a firm bed or floor. Keeping your legs in front of you, bend your knees so they are both pointed toward the ceiling and your feet are flat on the floor.  Tense your lower abdominal muscles to press your low back into the floor. This motion will rotate your pelvis so that your tail bone is scooping upwards rather than pointing at your feet or into the floor.  With a gentle tension and even breathing, hold this position for __________ seconds. Repeat __________ times. Complete this exercise __________ times per day.  STRENGTHENING  Abdominals, Crunches   Lie on a firm bed or floor. Keeping your legs in front of you, bend your knees so they are both pointed toward the ceiling and your feet are flat on the floor. Cross your arms over your chest.  Slightly tip your chin down without bending your neck.  Tense your abdominals and slowly lift your trunk high enough to just clear your shoulder blades. Lifting higher can put excessive stress on the low  back and does not further strengthen your abdominal muscles.  Control your return to the starting position. Repeat __________ times. Complete this exercise __________ times per day.  STRENGTHENING  Quadruped, Opposite UE/LE Lift  Assume a hands and knees position on a firm surface. Keep your hands under your shoulders and your knees under your hips. You may place padding under your knees for comfort.  Find your neutral spine and gently tense your abdominal  muscles so that you can maintain this position. Your shoulders and hips should form a rectangle that is parallel with the floor and is not twisted.  Keeping your trunk steady, lift your right hand no higher than your shoulder and then your left leg no higher than your hip. Make sure you are not holding your breath. Hold this position __________ seconds.  Continuing to keep your abdominal muscles tense and your back steady, slowly return to your starting position. Repeat with the opposite arm and leg. Repeat __________ times. Complete this exercise __________ times per day.  STRENGTHENING  Abdominals and Quadriceps, Straight Leg Raise   Lie on a firm bed or floor with both legs extended in front of you.  Keeping one leg in contact with the floor, bend the other knee so that your foot can rest flat on the floor.  Find your neutral spine, and tense your abdominal muscles to maintain your spinal position throughout the exercise.  Slowly lift your straight leg off the floor about 6 inches for a count of 15, making sure to not hold your breath.  Still keeping your neutral spine, slowly lower your leg all the way to the floor. Repeat this exercise with each leg __________ times. Complete this exercise __________ times per day. POSTURE AND BODY MECHANICS CONSIDERATIONS - Sciatica Keeping correct posture when sitting, standing or completing your activities will reduce the stress put on different body tissues, allowing injured tissues a chance to heal and limiting painful experiences. The following are general guidelines for improved posture. Your physician or physical therapist will provide you with any instructions specific to your needs. While reading these guidelines, remember:  The exercises prescribed by your provider will help you have the flexibility and strength to maintain correct postures.  The correct posture provides the optimal environment for your joints to work. All of your joints have  less wear and tear when properly supported by a spine with good posture. This means you will experience a healthier, less painful body.  Correct posture must be practiced with all of your activities, especially prolonged sitting and standing. Correct posture is as important when doing repetitive low-stress activities (typing) as it is when doing a single heavy-load activity (lifting). RESTING POSITIONS Consider which positions are most painful for you when choosing a resting position. If you have pain with flexion-based activities (sitting, bending, stooping, squatting), choose a position that allows you to rest in a less flexed posture. You would want to avoid curling into a fetal position on your side. If your pain worsens with extension-based activities (prolonged standing, working overhead), avoid resting in an extended position such as sleeping on your stomach. Most people will find more comfort when they rest with their spine in a more neutral position, neither too rounded nor too arched. Lying on a non-sagging bed on your side with a pillow between your knees, or on your back with a pillow under your knees will often provide some relief. Keep in mind, being in any one position for a prolonged period of  time, no matter how correct your posture, can still lead to stiffness. PROPER SITTING POSTURE In order to minimize stress and discomfort on your spine, you must sit with correct posture Sitting with good posture should be effortless for a healthy body. Returning to good posture is a gradual process. Many people can work toward this most comfortably by using various supports until they have the flexibility and strength to maintain this posture on their own. When sitting with proper posture, your ears will fall over your shoulders and your shoulders will fall over your hips. You should use the back of the chair to support your upper back. Your low back will be in a neutral position, just slightly arched.  You may place a small pillow or folded towel at the base of your low back for support.  When working at a desk, create an environment that supports good, upright posture. Without extra support, muscles fatigue and lead to excessive strain on joints and other tissues. Keep these recommendations in mind: CHAIR:   A chair should be able to slide under your desk when your back makes contact with the back of the chair. This allows you to work closely.  The chair's height should allow your eyes to be level with the upper part of your monitor and your hands to be slightly lower than your elbows. BODY POSITION  Your feet should make contact with the floor. If this is not possible, use a foot rest.  Keep your ears over your shoulders. This will reduce stress on your neck and low back. INCORRECT SITTING POSTURES   If you are feeling tired and unable to assume a healthy sitting posture, do not slouch or slump. This puts excessive strain on your back tissues, causing more damage and pain. Healthier options include:  Using more support, like a lumbar pillow.  Switching tasks to something that requires you to be upright or walking.  Talking a brief walk.  Lying down to rest in a neutral-spine position. PROLONGED STANDING WHILE SLIGHTLY LEANING FORWARD  When completing a task that requires you to lean forward while standing in one place for a long time, place either foot up on a stationary 2-4 inch high object to help maintain the best posture. When both feet are on the ground, the low back tends to lose its slight inward curve. If this curve flattens (or becomes too large), then the back and your other joints will experience too much stress, fatigue more quickly and can cause pain.  CORRECT STANDING POSTURES Proper standing posture should be assumed with all daily activities, even if they only take a few moments, like when brushing your teeth. As in sitting, your ears should fall over your shoulders and  your shoulders should fall over your hips. You should keep a slight tension in your abdominal muscles to brace your spine. Your tailbone should point down to the ground, not behind your body, resulting in an over-extended swayback posture.  INCORRECT STANDING POSTURES  Common incorrect standing postures include a forward head, locked knees and/or an excessive swayback. WALKING Walk with an upright posture. Your ears, shoulders and hips should all line-up. PROLONGED ACTIVITY IN A FLEXED POSITION When completing a task that requires you to bend forward at your waist or lean over a low surface, try to find a way to stabilize 3 of 4 of your limbs. You can place a hand or elbow on your thigh or rest a knee on the surface you are reaching across.  This will provide you more stability so that your muscles do not fatigue as quickly. By keeping your knees relaxed, or slightly bent, you will also reduce stress across your low back. CORRECT LIFTING TECHNIQUES DO :   Assume a wide stance. This will provide you more stability and the opportunity to get as close as possible to the object which you are lifting.  Tense your abdominals to brace your spine; then bend at the knees and hips. Keeping your back locked in a neutral-spine position, lift using your leg muscles. Lift with your legs, keeping your back straight.  Test the weight of unknown objects before attempting to lift them.  Try to keep your elbows locked down at your sides in order get the best strength from your shoulders when carrying an object.  Always ask for help when lifting heavy or awkward objects. INCORRECT LIFTING TECHNIQUES DO NOT:   Lock your knees when lifting, even if it is a small object.  Bend and twist. Pivot at your feet or move your feet when needing to change directions.  Assume that you cannot safely pick up a paperclip without proper posture. Document Released: 07/10/2005 Document Revised: 10/02/2011 Document Reviewed:  10/22/2008 [facility] Patient Information 2014 [place], [place].      Standley Brooking. Tonita Bills M.D.  Pre visit review using our clinic review tool, if applicable. No additional management support is needed unless otherwise documented below in the visit note.

## 2013-11-27 NOTE — Patient Instructions (Signed)
The sleep issue may be menopausal and would avoid xanax use on a regular basis for this ? If hrt or other would be temporarily helpful   For sleep and hot flushes  You also need pap and exam etc.   Can  Wean the Wellbutrin  Every other day  Or such for refill today.  Back  Exercises and time may be helpful.when not hurting to do them .     Sciatica with Rehab The sciatic nerve runs from the back down the leg and is responsible for sensation and control of the muscles in the back (posterior) side of the thigh, lower leg, and foot. Sciatica is a condition that is characterized by inflammation of this nerve.  SYMPTOMS   Signs of nerve damage, including numbness and/or weakness along the posterior side of the lower extremity.  Pain in the back of the thigh that may also travel down the leg.  Pain that worsens when sitting for long periods of time.  Occasionally, pain in the back or buttock. CAUSES  Inflammation of the sciatic nerve is the cause of sciatica. The inflammation is due to something irritating the nerve. Common sources of irritation include:  Sitting for long periods of time.  Direct trauma to the nerve.  Arthritis of the spine.  Herniated or ruptured disk.  Slipping of the vertebrae (spondylolithesis)  Pressure from soft tissues, such as muscles or ligament-like tissue (fascia). RISK INCREASES WITH:  Sports that place pressure or stress on the spine (football or weightlifting).  Poor strength and flexibility.  Failure to warm-up properly before activity.  Family history of low back pain or disk disorders.  Previous back injury or surgery.  Poor body mechanics, especially when lifting, or poor posture. PREVENTION   Warm up and stretch properly before activity.  Maintain physical fitness:  Strength, flexibility, and endurance.  Cardiovascular fitness.  Learn and use proper technique, especially with posture and lifting. When possible, have coach  correct improper technique.  Avoid activities that place stress on the spine. PROGNOSIS If treated properly, then sciatica usually resolves within 6 weeks. However, occasionally surgery is necessary.  RELATED COMPLICATIONS   Permanent nerve damage, including pain, numbness, tingle, or weakness.  Chronic back pain.  Risks of surgery: infection, bleeding, nerve damage, or damage to surrounding tissues. TREATMENT Treatment initially involves resting from any activities that aggravate your symptoms. The use of ice and medication may help reduce pain and inflammation. The use of strengthening and stretching exercises may help reduce pain with activity. These exercises may be performed at home or with referral to a therapist. A therapist may recommend further treatments, such as transcutaneous electronic nerve stimulation (TENS) or ultrasound. Your caregiver may recommend corticosteroid injections to help reduce inflammation of the sciatic nerve. If symptoms persist despite non-surgical (conservative) treatment, then surgery may be recommended. MEDICATION  If pain medication is necessary, then nonsteroidal anti-inflammatory medications, such as aspirin and ibuprofen, or other minor pain relievers, such as acetaminophen, are often recommended.  Do not take pain medication for 7 days before surgery.  Prescription pain relievers may be given if deemed necessary by your caregiver. Use only as directed and only as much as you need.  Ointments applied to the skin may be helpful.  Corticosteroid injections may be given by your caregiver. These injections should be reserved for the most serious cases, because they may only be given a certain number of times. HEAT AND COLD  Cold treatment (icing) relieves pain and reduces inflammation.  Cold treatment should be applied for 10 to 15 minutes every 2 to 3 hours for inflammation and pain and immediately after any activity that aggravates your symptoms. Use  ice packs or massage the area with a piece of ice (ice massage).  Heat treatment may be used prior to performing the stretching and strengthening activities prescribed by your caregiver, physical therapist, or athletic trainer. Use a heat pack or soak the injury in warm water. SEEK MEDICAL CARE IF:  Treatment seems to offer no benefit, or the condition worsens.  Any medications produce adverse side effects. EXERCISES  RANGE OF MOTION (ROM) AND STRETCHING EXERCISES - Sciatica Most people with sciatic will find that their symptoms worsen with either excessive bending forward (flexion) or arching at the low back (extension). The exercises which will help resolve your symptoms will focus on the opposite motion. Your physician, physical therapist or athletic trainer will help you determine which exercises will be most helpful to resolve your low back pain. Do not complete any exercises without first consulting with your clinician. Discontinue any exercises which worsen your symptoms until you speak to your clinician. If you have pain, numbness or tingling which travels down into your buttocks, leg or foot, the goal of the therapy is for these symptoms to move closer to your back and eventually resolve. Occasionally, these leg symptoms will get better, but your low back pain may worsen; this is typically an indication of progress in your rehabilitation. Be certain to be very alert to any changes in your symptoms and the activities in which you participated in the 24 hours prior to the change. Sharing this information with your clinician will allow him/her to most efficiently treat your condition. These exercises may help you when beginning to rehabilitate your injury. Your symptoms may resolve with or without further involvement from your physician, physical therapist or athletic trainer. While completing these exercises, remember:   Restoring tissue flexibility helps normal motion to return to the joints.  This allows healthier, less painful movement and activity.  An effective stretch should be held for at least 30 seconds.  A stretch should never be painful. You should only feel a gentle lengthening or release in the stretched tissue. FLEXION RANGE OF MOTION AND STRETCHING EXERCISES: STRETCH  Flexion, Single Knee to Chest   Lie on a firm bed or floor with both legs extended in front of you.  Keeping one leg in contact with the floor, bring your opposite knee to your chest. Hold your leg in place by either grabbing behind your thigh or at your knee.  Pull until you feel a gentle stretch in your low back. Hold __________ seconds.  Slowly release your grasp and repeat the exercise with the opposite side. Repeat __________ times. Complete this exercise __________ times per day.  STRETCH  Flexion, Double Knee to Chest  Lie on a firm bed or floor with both legs extended in front of you.  Keeping one leg in contact with the floor, bring your opposite knee to your chest.  Tense your stomach muscles to support your back and then lift your other knee to your chest. Hold your legs in place by either grabbing behind your thighs or at your knees.  Pull both knees toward your chest until you feel a gentle stretch in your low back. Hold __________ seconds.  Tense your stomach muscles and slowly return one leg at a time to the floor. Repeat __________ times. Complete this exercise __________ times per day.  STRETCH  Low Trunk Rotation   Lie on a firm bed or floor. Keeping your legs in front of you, bend your knees so they are both pointed toward the ceiling and your feet are flat on the floor.  Extend your arms out to the side. This will stabilize your upper body by keeping your shoulders in contact with the floor.  Gently and slowly drop both knees together to one side until you feel a gentle stretch in your low back. Hold for __________ seconds.  Tense your stomach muscles to support your low  back as you bring your knees back to the starting position. Repeat the exercise to the other side. Repeat __________ times. Complete this exercise __________ times per day  EXTENSION RANGE OF MOTION AND FLEXIBILITY EXERCISES: STRETCH  Extension, Prone on Elbows  Lie on your stomach on the floor, a bed will be too soft. Place your palms about shoulder width apart and at the height of your head.  Place your elbows under your shoulders. If this is too painful, stack pillows under your chest.  Allow your body to relax so that your hips drop lower and make contact more completely with the floor.  Hold this position for __________ seconds.  Slowly return to lying flat on the floor. Repeat __________ times. Complete this exercise __________ times per day.  RANGE OF MOTION  Extension, Prone Press Ups  Lie on your stomach on the floor, a bed will be too soft. Place your palms about shoulder width apart and at the height of your head.  Keeping your back as relaxed as possible, slowly straighten your elbows while keeping your hips on the floor. You may adjust the placement of your hands to maximize your comfort. As you gain motion, your hands will come more underneath your shoulders.  Hold this position __________ seconds.  Slowly return to lying flat on the floor. Repeat __________ times. Complete this exercise __________ times per day.  STRENGTHENING EXERCISES - Sciatica  These exercises may help you when beginning to rehabilitate your injury. These exercises should be done near your "sweet spot." This is the neutral, low-back arch, somewhere between fully rounded and fully arched, that is your least painful position. When performed in this safe range of motion, these exercises can be used for people who have either a flexion or extension based injury. These exercises may resolve your symptoms with or without further involvement from your physician, physical therapist or athletic trainer. While  completing these exercises, remember:   Muscles can gain both the endurance and the strength needed for everyday activities through controlled exercises.  Complete these exercises as instructed by your physician, physical therapist or athletic trainer. Progress with the resistance and repetition exercises only as your caregiver advises.  You may experience muscle soreness or fatigue, but the pain or discomfort you are trying to eliminate should never worsen during these exercises. If this pain does worsen, stop and make certain you are following the directions exactly. If the pain is still present after adjustments, discontinue the exercise until you can discuss the trouble with your clinician. STRENGTHENING Deep Abdominals, Pelvic Tilt   Lie on a firm bed or floor. Keeping your legs in front of you, bend your knees so they are both pointed toward the ceiling and your feet are flat on the floor.  Tense your lower abdominal muscles to press your low back into the floor. This motion will rotate your pelvis so that your tail bone is scooping  upwards rather than pointing at your feet or into the floor.  With a gentle tension and even breathing, hold this position for __________ seconds. Repeat __________ times. Complete this exercise __________ times per day.  STRENGTHENING  Abdominals, Crunches   Lie on a firm bed or floor. Keeping your legs in front of you, bend your knees so they are both pointed toward the ceiling and your feet are flat on the floor. Cross your arms over your chest.  Slightly tip your chin down without bending your neck.  Tense your abdominals and slowly lift your trunk high enough to just clear your shoulder blades. Lifting higher can put excessive stress on the low back and does not further strengthen your abdominal muscles.  Control your return to the starting position. Repeat __________ times. Complete this exercise __________ times per day.  STRENGTHENING  Quadruped,  Opposite UE/LE Lift  Assume a hands and knees position on a firm surface. Keep your hands under your shoulders and your knees under your hips. You may place padding under your knees for comfort.  Find your neutral spine and gently tense your abdominal muscles so that you can maintain this position. Your shoulders and hips should form a rectangle that is parallel with the floor and is not twisted.  Keeping your trunk steady, lift your right hand no higher than your shoulder and then your left leg no higher than your hip. Make sure you are not holding your breath. Hold this position __________ seconds.  Continuing to keep your abdominal muscles tense and your back steady, slowly return to your starting position. Repeat with the opposite arm and leg. Repeat __________ times. Complete this exercise __________ times per day.  STRENGTHENING  Abdominals and Quadriceps, Straight Leg Raise   Lie on a firm bed or floor with both legs extended in front of you.  Keeping one leg in contact with the floor, bend the other knee so that your foot can rest flat on the floor.  Find your neutral spine, and tense your abdominal muscles to maintain your spinal position throughout the exercise.  Slowly lift your straight leg off the floor about 6 inches for a count of 15, making sure to not hold your breath.  Still keeping your neutral spine, slowly lower your leg all the way to the floor. Repeat this exercise with each leg __________ times. Complete this exercise __________ times per day. POSTURE AND BODY MECHANICS CONSIDERATIONS - Sciatica Keeping correct posture when sitting, standing or completing your activities will reduce the stress put on different body tissues, allowing injured tissues a chance to heal and limiting painful experiences. The following are general guidelines for improved posture. Your physician or physical therapist will provide you with any instructions specific to your needs. While reading  these guidelines, remember:  The exercises prescribed by your provider will help you have the flexibility and strength to maintain correct postures.  The correct posture provides the optimal environment for your joints to work. All of your joints have less wear and tear when properly supported by a spine with good posture. This means you will experience a healthier, less painful body.  Correct posture must be practiced with all of your activities, especially prolonged sitting and standing. Correct posture is as important when doing repetitive low-stress activities (typing) as it is when doing a single heavy-load activity (lifting). RESTING POSITIONS Consider which positions are most painful for you when choosing a resting position. If you have pain with flexion-based activities (sitting,  bending, stooping, squatting), choose a position that allows you to rest in a less flexed posture. You would want to avoid curling into a fetal position on your side. If your pain worsens with extension-based activities (prolonged standing, working overhead), avoid resting in an extended position such as sleeping on your stomach. Most people will find more comfort when they rest with their spine in a more neutral position, neither too rounded nor too arched. Lying on a non-sagging bed on your side with a pillow between your knees, or on your back with a pillow under your knees will often provide some relief. Keep in mind, being in any one position for a prolonged period of time, no matter how correct your posture, can still lead to stiffness. PROPER SITTING POSTURE In order to minimize stress and discomfort on your spine, you must sit with correct posture Sitting with good posture should be effortless for a healthy body. Returning to good posture is a gradual process. Many people can work toward this most comfortably by using various supports until they have the flexibility and strength to maintain this posture on their  own. When sitting with proper posture, your ears will fall over your shoulders and your shoulders will fall over your hips. You should use the back of the chair to support your upper back. Your low back will be in a neutral position, just slightly arched. You may place a small pillow or folded towel at the base of your low back for support.  When working at a desk, create an environment that supports good, upright posture. Without extra support, muscles fatigue and lead to excessive strain on joints and other tissues. Keep these recommendations in mind: CHAIR:   A chair should be able to slide under your desk when your back makes contact with the back of the chair. This allows you to work closely.  The chair's height should allow your eyes to be level with the upper part of your monitor and your hands to be slightly lower than your elbows. BODY POSITION  Your feet should make contact with the floor. If this is not possible, use a foot rest.  Keep your ears over your shoulders. This will reduce stress on your neck and low back. INCORRECT SITTING POSTURES   If you are feeling tired and unable to assume a healthy sitting posture, do not slouch or slump. This puts excessive strain on your back tissues, causing more damage and pain. Healthier options include:  Using more support, like a lumbar pillow.  Switching tasks to something that requires you to be upright or walking.  Talking a brief walk.  Lying down to rest in a neutral-spine position. PROLONGED STANDING WHILE SLIGHTLY LEANING FORWARD  When completing a task that requires you to lean forward while standing in one place for a long time, place either foot up on a stationary 2-4 inch high object to help maintain the best posture. When both feet are on the ground, the low back tends to lose its slight inward curve. If this curve flattens (or becomes too large), then the back and your other joints will experience too much stress, fatigue more  quickly and can cause pain.  CORRECT STANDING POSTURES Proper standing posture should be assumed with all daily activities, even if they only take a few moments, like when brushing your teeth. As in sitting, your ears should fall over your shoulders and your shoulders should fall over your hips. You should keep a slight tension in  your abdominal muscles to brace your spine. Your tailbone should point down to the ground, not behind your body, resulting in an over-extended swayback posture.  INCORRECT STANDING POSTURES  Common incorrect standing postures include a forward head, locked knees and/or an excessive swayback. WALKING Walk with an upright posture. Your ears, shoulders and hips should all line-up. PROLONGED ACTIVITY IN A FLEXED POSITION When completing a task that requires you to bend forward at your waist or lean over a low surface, try to find a way to stabilize 3 of 4 of your limbs. You can place a hand or elbow on your thigh or rest a knee on the surface you are reaching across. This will provide you more stability so that your muscles do not fatigue as quickly. By keeping your knees relaxed, or slightly bent, you will also reduce stress across your low back. CORRECT LIFTING TECHNIQUES DO :   Assume a wide stance. This will provide you more stability and the opportunity to get as close as possible to the object which you are lifting.  Tense your abdominals to brace your spine; then bend at the knees and hips. Keeping your back locked in a neutral-spine position, lift using your leg muscles. Lift with your legs, keeping your back straight.  Test the weight of unknown objects before attempting to lift them.  Try to keep your elbows locked down at your sides in order get the best strength from your shoulders when carrying an object.  Always ask for help when lifting heavy or awkward objects. INCORRECT LIFTING TECHNIQUES DO NOT:   Lock your knees when lifting, even if it is a small  object.  Bend and twist. Pivot at your feet or move your feet when needing to change directions.  Assume that you cannot safely pick up a paperclip without proper posture. Document Released: 07/10/2005 Document Revised: 10/02/2011 Document Reviewed: 10/22/2008 [facility] Patient Information 2014 [place], [place].

## 2014-05-18 ENCOUNTER — Ambulatory Visit: Payer: BC Managed Care – PPO | Admitting: Internal Medicine

## 2014-05-20 ENCOUNTER — Ambulatory Visit (INDEPENDENT_AMBULATORY_CARE_PROVIDER_SITE_OTHER): Payer: BC Managed Care – PPO | Admitting: Internal Medicine

## 2014-05-20 ENCOUNTER — Encounter: Payer: Self-pay | Admitting: Internal Medicine

## 2014-05-20 VITALS — BP 110/76 | Temp 98.7°F | Ht 65.25 in | Wt 114.0 lb

## 2014-05-20 DIAGNOSIS — N959 Unspecified menopausal and perimenopausal disorder: Secondary | ICD-10-CM

## 2014-05-20 DIAGNOSIS — E7800 Pure hypercholesterolemia, unspecified: Secondary | ICD-10-CM | POA: Insufficient documentation

## 2014-05-20 DIAGNOSIS — F418 Other specified anxiety disorders: Secondary | ICD-10-CM

## 2014-05-20 MED ORDER — ALPRAZOLAM 0.25 MG PO TABS: ORAL_TABLET | ORAL | Status: DC

## 2014-05-20 NOTE — Patient Instructions (Signed)
Lipids are not as favorable but not seriously high . Your calculated 10 year risk of heart attack and stroke is 1.1%   Attend to lifestyle .  Healthy lifestyle includes : At least 150 minutes of exercise weeks  , weight at healthy levels, which is usually   BMI 19-25. Avoid trans fats and processed foods;  Increase fresh fruits and veges to 5 servings per day. And avoid sweet beverages including tea and juice. Mediterranean diet with olive oil and nuts have been noted to be heart and brain healthy . Avoid tobacco products . Limit  alcohol to  7 per week for women and 14 servings for men.  Get adequate sleep . Wear seat belts . Don't text and drive .   [facility]: Terrance Mass MD  Address: [address], [place], [place] [address]  [phone]       Xanax as needed   Not with alcohol . Can be habit forming

## 2014-05-20 NOTE — Progress Notes (Signed)
Pre visit review using our clinic review tool, if applicable. No additional management support is needed unless otherwise documented below in the visit note.  Chief Complaint  Patient presents with  . Follow-up    meds for travel cholesterol  level went up.     HPI: Jessica Chan  53 y.o.  Comes in for refill medication and discuss screening labs done for health assessment. Called health check 360 through her husband's insurance Last ov was may for sciatica  Since that time has done well head health assessment was told the cholesterol is went up some and sugar might of been borderline. Her waist to hip ratio may have become more unfavorable. She walks every day with no cardiovascular limitation tries to the right although does do some sweets and sugar in some drinks.  Maternal uncle and grandfather  Had diabetes.   she is to travel to [place] with her husband for 2 weeks for a business trip and gets very anxious around flight times asks first Xanax to use as needed at this time.   She has had some sleep issues which appear to be around the perimenopause and we did a gynecology referral in May June but she lost the number and had to reschedule and couldn't find it. She still having hot flashes in some manner no. And bleeding at this time  ROS: See pertinent positives and negatives per HPI. GI seems stable.  No current cp sob syncope bleeding still some flushes   Past Medical History  Diagnosis Date  . Allergic rhinitis   . GERD (gastroesophageal reflux disease)   . Hemangioma of liver     incidental finding-large, right and left lobes of liver   . Anxiety disorder   . Depression   . IBS (irritable bowel syndrome)   . Diverticulosis   . Hiatal hernia   . Sinusitis, acute maxillary 07/29/2012    right  hx of sinus surgery  fu if needed recurrecne    . BLEPHARITIS 05/27/2007    Qualifier: Diagnosis of  By: Hulan Saas, CMA (AAMA), Quita Skye   . Diverticulitis   . Diverticulitis of colon -  recurrent 10/02/2013  . ESOPHAGEAL STRICTURE 11/15/2009    Qualifier: Diagnosis of  By: Shane Crutch, Amy S     Family History  Problem Relation Age of Onset  . Lung cancer Mother   . Heart disease Father   . Autism Son   . Colon cancer Maternal Uncle   . Breast cancer Maternal Grandmother     History   Social History  . Marital Status: Married    Spouse Name: N/A    Number of Children: 2  . Years of Education: N/A   Occupational History  . Caregiver    Social History Main Topics  . Smoking status: Never Smoker   . Smokeless tobacco: Never Used  . Alcohol Use: Yes     Comment: Social Drinker  . Drug Use: No  . Sexual Activity: None   Other Topics Concern  . None   Social History Narrative   Married HHof 4 one sone autistic   Husband had prostate cancer in last few years   Some exercise  Runs    No tobacco    Outpatient Encounter Prescriptions as of 05/20/2014  Medication Sig  . ALPRAZolam (XANAX) 0.25 MG tablet take 1 tablet by mouth if needed  . buPROPion (WELLBUTRIN XL) 150 MG 24 hr tablet Take 1 tablet (150 mg total) by mouth every  morning.  . calcium-vitamin D (OSCAL WITH D) 500-200 MG-UNIT per tablet Take 1 tablet by mouth daily.  . Multiple Vitamin (MULTIVITAMIN) tablet Take 1 tablet by mouth daily.  . [DISCONTINUED] ALPRAZolam (XANAX) 0.25 MG tablet take 1 tablet by mouth if needed    EXAM:  BP 110/76  Temp(Src) 98.7 F (37.1 C) (Oral)  Ht 5' 5.25" (1.657 m)  Wt 114 lb (51.71 kg)  BMI 18.83 kg/m2  LMP 07/25/2011  Body mass index is 18.83 kg/(m^2).  GENERAL: vitals reviewed and listed above, alert, oriented, appears well hydrated and in no acute distress HEENT: atraumatic, conjunctiva  clear, no obvious abnormalities on inspection of external nose and ears OP : no lesion edema or exudate  NECK: no obvious masses on inspection palpation  LUNGS: clear to auscultation bilaterally, no wheezes, rales or rhonchi, good air movement CV: HRRR, no  clubbing cyanosis or  peripheral edema nl cap refill  Abdomen:  Sof,t normal bowel sounds without hepatosplenomegaly, no guarding rebound or masses no CVA tenderness MS: moves all extremities without noticeable focal  abnormality PSYCH: pleasant and cooperative, no obvious depression  Lab Results  Component Value Date   WBC 4.4* 12/28/2011   HGB 13.0 12/28/2011   HCT 38.6 12/28/2011   PLT 237.0 12/28/2011   GLUCOSE 57* 12/28/2011   CHOL 167 12/28/2011   TRIG 80.0 12/28/2011   HDL 56.30 12/28/2011   LDLCALC 95 12/28/2011   ALT 13 12/28/2011   AST 19 12/28/2011   NA 142 12/28/2011   K 4.0 12/28/2011   CL 104 12/28/2011   CREATININE 0.7 12/28/2011   BUN 17 12/28/2011   CO2 29 12/28/2011   TSH 2.57 12/28/2011   reviewed yearly lab data blood pressure from a lot 2012.   total cholesterol 228 triglyceride 74 LDL 141 HDL 72 in 2015. Blood sugar was normal but at 82 blood pressure 112/70 a test called glycated serum protein was 273 in the upper limits of normal was 270.  ASSESSMENT AND PLAN:  Discussed the following assessment and plan:  Elevated cholesterol -  reviewed her data from the last 4-5 years although total cholesterol is gone off ratio profile iiss faavorable.  ACA risk 10 year is only 1.1%attend to lsi  Situational anxiety - travel xanax as needed with caution  Menopausal and perimenopausal disorder - affecting sleep name of gyne given again  -Patient advised to return or notify health care team  if symptoms worsen ,persist or new concerns arise.  Patient Instructions   Lipids are not as favorable but not seriously high . Your calculated 10 year risk of heart attack and stroke is 1.1%   Attend to lifestyle .  Healthy lifestyle includes : At least 150 minutes of exercise weeks  , weight at healthy levels, which is usually   BMI 19-25. Avoid trans fats and processed foods;  Increase fresh fruits and veges to 5 servings per day. And avoid sweet beverages including tea and juice. Mediterranean diet with  olive oil and nuts have been noted to be heart and brain healthy . Avoid tobacco products . Limit  alcohol to  7 per week for women and 14 servings for men.  Get adequate sleep . Wear seat belts . Don't text and drive .   [facility]: Terrance Mass MD  Address: [address], [place], [place] [address]  [phone]       Xanax as needed   Not with alcohol . Can be  habit forming      Standley Brooking. Panosh M.D. Total visit 73mins > 50% spent counseling and coordinating care

## 2014-05-25 ENCOUNTER — Telehealth: Payer: Self-pay | Admitting: Internal Medicine

## 2014-05-25 NOTE — Telephone Encounter (Signed)
Patient reports a history of diverticulitis.  She had a rx for cipro and flagyl on hand given to her at the last office visit in March.  She woke up with terrible RLQ pain last night, pain is still present this am and she started herself on antibiotics she has at home.  She will be traveling out of the country next week.  She would like to take an rx with her on the trip.  She is asking if she needs to be seen? Is it ok to send in another RX?

## 2014-05-26 NOTE — Telephone Encounter (Signed)
I have scheduled her for 05/28/14 3:15 with the patient

## 2014-05-26 NOTE — Telephone Encounter (Signed)
I recommend she be seen before she goes out of the country

## 2014-05-28 ENCOUNTER — Ambulatory Visit (INDEPENDENT_AMBULATORY_CARE_PROVIDER_SITE_OTHER): Payer: BC Managed Care – PPO | Admitting: Internal Medicine

## 2014-05-28 ENCOUNTER — Encounter: Payer: Self-pay | Admitting: Internal Medicine

## 2014-05-28 ENCOUNTER — Other Ambulatory Visit (INDEPENDENT_AMBULATORY_CARE_PROVIDER_SITE_OTHER): Payer: BC Managed Care – PPO

## 2014-05-28 VITALS — BP 104/62 | HR 72 | Ht 64.5 in | Wt 113.0 lb

## 2014-05-28 DIAGNOSIS — K5732 Diverticulitis of large intestine without perforation or abscess without bleeding: Secondary | ICD-10-CM

## 2014-05-28 LAB — BUN: BUN: 16 mg/dL (ref 6–23)

## 2014-05-28 LAB — CREATININE, SERUM: Creatinine, Ser: 1 mg/dL (ref 0.4–1.2)

## 2014-05-28 NOTE — Patient Instructions (Signed)
Your physician has requested that you go to the basement for the following lab work before leaving today: BUN, creatine  You have been scheduled for a CT scan of the abdomen and pelvis at [facility] ([address]---this is in the same building as Press [facility]).   You are scheduled on 05/29/14 at 10:15AM. You should arrive 15 minutes prior to your appointment time for registration. Please follow the written instructions below on the day of your exam:  WARNING: IF YOU ARE ALLERGIC TO IODINE/X-RAY DYE, PLEASE NOTIFY RADIOLOGY IMMEDIATELY AT [phone]! YOU WILL BE GIVEN A 13 HOUR PREMEDICATION PREP.  1) Do not eat or drink anything after 6:15am (4 hours prior to your test) 2) You have been given 2 bottles of oral contrast to drink. The solution may taste               better if refrigerated, but do NOT add ice or any other liquid to this solution. Shake             well before drinking.    Drink 1 bottle of contrast @ 8:15am (2 hours prior to your exam)  Drink 1 bottle of contrast @ 9:15am (1 hour prior to your exam)  You may take any medications as prescribed with a small amount of water except for the following: Metformin, Glucophage, Glucovance, Avandamet, Riomet, Fortamet, Actoplus Met, Janumet, Glumetza or Metaglip. The above medications must be held the day of the exam AND 48 hours after the exam.  The purpose of you drinking the oral contrast is to aid in the visualization of your intestinal tract. The contrast solution may cause some diarrhea. Before your exam is started, you will be given a small amount of fluid to drink. Depending on your individual set of symptoms, you may also receive an intravenous injection of x-ray contrast/dye. Plan on being at [facility] for 30 minutes or long, depending on the type of exam you are having performed.  This test typically takes 30-45 minutes to complete.  If you have any questions regarding your exam or if you need to  reschedule, you may call the CT department at [phone] between the hours of 8:00 am and 5:00 pm, Monday-Friday.  ________________________________________________________________________  I appreciate the opportunity to care for you.

## 2014-05-28 NOTE — Progress Notes (Signed)
   Subjective:    Patient ID: Jessica Chan, female    DOB: 1960/10/28, 53 y.o.   MRN: [id number]  HPI Here for f/u, has a hx of recurrent diverticulitis. Acute LLQ pain 3 days ago - awakened her Persisted and started cipro and metronidazole - she is slightly better Going to [facility] for 7 d in about 4 days  Medications, allergies, past medical history, past surgical history, family history and social history are reviewed and updated in the EMR.   Review of Systems No GU sxs    Objective:   Physical Exam WDWN Thin NAD Abd is thin soft and moderately tender w/o rebound in LLQ     Assessment & Plan:  Diverticulitis of colon - Plan: CT Abdomen Pelvis W Contrast, Creatinine, serum, BUN    CT scan to evaluate for complications  Stay on cipro and metronidazole Refer back to Dr. Johney Maine If CT w/o severe problems will give addl Abx to have while travelling

## 2014-05-29 ENCOUNTER — Other Ambulatory Visit: Payer: Self-pay

## 2014-05-29 ENCOUNTER — Ambulatory Visit (INDEPENDENT_AMBULATORY_CARE_PROVIDER_SITE_OTHER)
Admission: RE | Admit: 2014-05-29 | Discharge: 2014-05-29 | Disposition: A | Discharge status: Home / Self Care | Payer: BC Managed Care – PPO | Source: Ambulatory Visit | Attending: Internal Medicine | Admitting: Internal Medicine

## 2014-05-29 DIAGNOSIS — K5732 Diverticulitis of large intestine without perforation or abscess without bleeding: Secondary | ICD-10-CM

## 2014-05-29 MED ORDER — METRONIDAZOLE 500 MG PO TABS: 500.0000 mg | ORAL_TABLET | Freq: Two times a day (BID) | ORAL | Status: DC

## 2014-05-29 MED ORDER — IOHEXOL 300 MG/ML  SOLN
80.0000 mL | Freq: Once | INTRAMUSCULAR | Status: AC | PRN
  Administered 2014-05-29: 80 mL via INTRAVENOUS

## 2014-05-29 MED ORDER — CIPROFLOXACIN HCL 500 MG PO TABS: 500.0000 mg | ORAL_TABLET | Freq: Two times a day (BID) | ORAL | Status: DC

## 2014-05-29 NOTE — Progress Notes (Signed)
Quick Note:  Mild diverticulitis by CT'unchanged henmangiomas of liver Please refill her cipro and metronidazole so she can take with her on trip  Think she should be feeling better and ok for travel call back prn Needs visit to see Dr. Johney Maine again re: recurrent diverticulitis - ask her timing ______

## 2014-06-01 ENCOUNTER — Ambulatory Visit: Payer: BC Managed Care – PPO | Admitting: Internal Medicine

## 2014-06-10 ENCOUNTER — Other Ambulatory Visit (INDEPENDENT_AMBULATORY_CARE_PROVIDER_SITE_OTHER): Payer: Self-pay | Admitting: Surgery

## 2014-06-10 NOTE — H&P (Signed)
Jessica Chan 06/10/2014 11:05 AM Location: [facility] Patient #: [id number] DOB: 06-07-1961 Married / Language: Undefined / Race: Undefined Female History of Present Illness [name] MD; 06/10/2014 1:52 PM) Patient words: possible colon resection.  The patient is a 53 year old female who presents with diverticulitis. Pleasant woman sent by Dr. Silvano Rusk with gastroenterology for recurrent diverticulitis.  Pleasant and active woman. No prior abdominal surgery. Has had episodes for diverticulitis for the past decade. Usually controlled with antibiotic regimen of Cipro and Flagyl. Had colonoscopy 4 years ago noting left-sided diverticulitis/osis. Especially and distal descending/proximal sigmoid colon. She was sent to me to consider resection. She was considering this in 2011. Unfortunately her husband was diagnosed with cancer so she deferred. The attacks seem to be less often. Usually 1 or 2 a year. However, she's had repeated attacks this year. They're becoming more intense and more sudden. It concerns her. She also notes her bowels have had more urgency and constipation concerning as well.   She was more open to considering surgery after her last discussion with her gastroenterologist. She knows a friend of a friend whom had a robotic colectomy by me and had good things to say. She was very interested in a minimally invasive especially robotic approach. Other Problems Erasmo Leventhal, RN, BSN; 06/10/2014 11:05 AM) Anxiety Disorder Diverticulosis Gastric Ulcer Gastroesophageal Reflux Disease Kidney Joaquim Lai  Past Surgical History Erasmo Leventhal, RN, BSN; 06/10/2014 11:05 AM) Tonsillectomy  Diagnostic Studies History (Erasmo Leventhal, RN, BSN; 06/10/2014 11:05 AM) Colonoscopy 1-5 years ago Mammogram within last year Pap Smear 1-5 years ago  Allergies Erasmo Leventhal, RN, BSN; 06/10/2014 11:06 AM) Amoxicillin-Pot Clavulanate  *PENICILLINS* Meperidine HCl *ANALGESICS - OPIOID*  Medication History Erasmo Leventhal, RN, BSN; 06/10/2014 11:08 AM) ALPRAZolam (0.25MG  Tablet, Oral as needed) Active. MetroNIDAZOLE (500MG  Tablet, Oral daily) Active. BuPROPion HCl ER (XL) (150MG  Tablet ER 24HR, Oral daily) Active. Multiple Vitamin (Oral daily) Active. Medications Reconciled  Social History Multimedia programmer, RN, BSN; 06/10/2014 11:05 AM) Alcohol use Moderate alcohol use. Caffeine use Coffee. No drug use Tobacco use Never smoker.  Family History (Erasmo Leventhal, RN, BSN; 06/10/2014 11:05 AM) Alcohol Abuse Father. Heart Disease Father. Hypertension Father. Kidney Disease Mother. Respiratory Condition Father, Mother. Thyroid problems Mother.  Pregnancy / Birth History (Erasmo Leventhal, RN, BSN; 06/10/2014 11:05 AM) Age at menarche 27 years. Age of menopause 52-55 Gravida 2 Irregular periods Maternal age 77-30 Para 2     Review of Systems Occupational hygienist, BSN; 06/10/2014 11:05 AM) General Not Present- Appetite Loss, Chills, Fatigue, Fever, Night Sweats, Weight Gain and Weight Loss. Skin Not Present- Change in Wart/Mole, Dryness, Hives, Jaundice, New Lesions, Non-Healing Wounds, Rash and Ulcer. HEENT Present- Seasonal Allergies and Wears glasses/contact lenses. Not Present- Earache, Hearing Loss, Hoarseness, Nose Bleed, Oral Ulcers, Ringing in the Ears, Sinus Pain, Sore Throat, Visual Disturbances and Yellow Eyes. Cardiovascular Not Present- Chest Pain, Difficulty Breathing Lying Down, Leg Cramps, Palpitations, Rapid Heart Rate, Shortness of Breath and Swelling of Extremities. Gastrointestinal Present- Abdominal Pain, Change in Bowel Habits and Constipation. Not Present- Bloating, Bloody Stool, Chronic diarrhea, Difficulty Swallowing, Excessive gas, Gets full quickly at meals, Hemorrhoids, Indigestion, Nausea, Rectal Pain and Vomiting. Female Genitourinary Present- Nocturia. Not  Present- Frequency, Painful Urination, Pelvic Pain and Urgency. Musculoskeletal Not Present- Back Pain, Joint Pain, Joint Stiffness, Muscle Pain, Muscle Weakness and Swelling of Extremities. Neurological Not Present- Decreased Memory, Fainting, Headaches, Numbness, Seizures, Tingling, Tremor, Trouble walking and Weakness. Psychiatric Present- Change in Sleep Pattern. Not  Present- Anxiety, Bipolar, Depression, Fearful and Frequent crying. Endocrine Present- Hair Changes and Hot flashes. Not Present- Cold Intolerance, Excessive Hunger, Heat Intolerance and New Diabetes. Hematology Not Present- Easy Bruising, Excessive bleeding, Gland problems, HIV and Persistent Infections.  Vitals Occupational hygienist, BSN; 06/10/2014 11:11 AM) 06/10/2014 11:08 AM Weight: 111.4 lb Height: 65in Body Surface Area: 1.52 m Body Mass Index: 18.54 kg/m Temp.: 98.66F(Oral)  Pulse: 68 (Regular)  Resp.: 18 (Unlabored)  BP: 90/60 (Sitting, Left Arm, Standard)     Physical Exam [name] MD; 06/10/2014 11:56 AM)  General Mental Status-Alert. General Appearance-Not in acute distress, Not Sickly. Orientation-Oriented X3. Hydration-Well hydrated. Voice-Normal.  Integumentary Global Assessment Upon inspection and palpation of skin surfaces of the - Axillae: non-tender, no inflammation or ulceration, no drainage. and Distribution of scalp and body hair is normal. General Characteristics Temperature - normal warmth is noted.  Head and Neck Head-normocephalic, atraumatic with no lesions or palpable masses. Face Global Assessment - atraumatic, no absence of expression. Neck Global Assessment - no abnormal movements, no bruit auscultated on the right, no bruit auscultated on the left, no decreased range of motion, non-tender. Trachea-midline. Thyroid Gland Characteristics - non-tender.  Eye Eyeball - Left-Extraocular movements intact, No Nystagmus. Eyeball -  Right-Extraocular movements intact, No Nystagmus. Cornea - Left-No Hazy. Cornea - Right-No Hazy. Sclera/Conjunctiva - Left-No scleral icterus, No Discharge. Sclera/Conjunctiva - Right-No scleral icterus, No Discharge. Pupil - Left-Direct reaction to light normal. Pupil - Right-Direct reaction to light normal.  ENMT Ears Pinna - Left - no drainage observed, no generalized tenderness observed. Right - no drainage observed, no generalized tenderness observed. Nose and Sinuses External Inspection of the Nose - no destructive lesion observed. Inspection of the nares - Left - quiet respiration. Right - quiet respiration. Mouth and Throat Lips - Upper Lip - no fissures observed, no pallor noted. Lower Lip - no fissures observed, no pallor noted. Nasopharynx - no discharge present. Oral Cavity/Oropharynx - Tongue - no dryness observed. Oral Mucosa - no cyanosis observed. Hypopharynx - no evidence of airway distress observed.  Chest and Lung Exam Inspection Movements - Normal and Symmetrical. Accessory muscles - No use of accessory muscles in breathing. Palpation Palpation of the chest reveals - Non-tender. Auscultation Breath sounds - Normal and Clear.  Cardiovascular Auscultation Rhythm - Regular. Murmurs & Other Heart Sounds - Auscultation of the heart reveals - No Murmurs and No Systolic Clicks.  Abdomen Inspection Inspection of the abdomen reveals - No Visible peristalsis and No Abnormal pulsations. Umbilicus - No Bleeding, No Urine drainage. Palpation/Percussion Palpation and Percussion of the abdomen reveal - Soft, Non Tender, No Rebound tenderness, No Rigidity (guarding) and No Cutaneous hyperesthesia. Note: Mild soreness that left flank/left lower quadrant. Not severe. No guarding. Abdomen otherwise soft and flat.   Female Genitourinary Sexual Maturity Tanner 5 - Adult hair pattern. Note: No vaginal bleeding nor discharge   Peripheral Vascular Upper  Extremity Inspection - Left - No Cyanotic nailbeds, Not Ischemic. Right - No Cyanotic nailbeds, Not Ischemic.  Neurologic Neurologic evaluation reveals -normal attention span and ability to concentrate, able to name objects and repeat phrases. Appropriate fund of knowledge , normal sensation and normal coordination. Mental Status Affect - not angry, not paranoid. Cranial Nerves-Normal Bilaterally. Gait-Normal.  Neuropsychiatric Mental status exam performed with findings of-able to articulate well with normal speech/language, rate, volume and coherence, thought content normal with ability to perform basic computations and apply abstract reasoning and no evidence of hallucinations, delusions, obsessions or homicidal/suicidal  ideation.  Musculoskeletal Global Assessment Spine, Ribs and Pelvis - no instability, subluxation or laxity. Right Upper Extremity - no instability, subluxation or laxity.  Lymphatic Head & Neck  General Head & Neck Lymphatics: Bilateral - Description - No Localized lymphadenopathy. Axillary  General Axillary Region: Bilateral - Description - No Localized lymphadenopathy. Femoral & Inguinal  Generalized Femoral & Inguinal Lymphatics: Left - Description - No Localized lymphadenopathy. Right - Description - No Localized lymphadenopathy.    Assessment & Plan [name] MD; 06/10/2014 1:28 PM)  DIVERTICULITIS OF LARGE INTESTINE WITHOUT PERFORATION OR ABSCESS WITHOUT BLEEDING (562.11  K57.32) Impression: Patient with chronic diverticulitis. Recurrent attacks happening every year. At least 15 attacks. Now becoming more intense with less notice. Still resolves with oral antibiotics. Some change in bowel habits makes me concerned for chronic thickening/stricture as well.  I again think she would benefit from surgery, especially since I saw her 4 years ago. She is interested in proceeding now that they are happening more intensely.  Current  Plans Schedule for Surgery Pt Education - [facility] Bowel Prep Pt Education - [facility] Good Bowel Health (Davaris Youtsey) Pt Education - [facility] Abdominal Surgery (Tyechia Allmendinger) Started Neomycin Sulfate 500MG , 2 (two) Tablet SEE NOTE, #6, 06/10/2014, No Refill. Local Order: TAKE TWO TABLETS AT 2 PM, 3 PM, AND 10 PM THE DAY PRIOR TO SURGERY Started Flagyl 500MG , 2 (two) Tablet SEE NOTE, #6, 06/10/2014, No Refill. Local Order: Take at 2pm, 3pm, and 10pm the day prior to your colon operation [facility] Consent - Colectomy (Arless Vineyard): discussed with patient and provided information.

## 2014-06-25 ENCOUNTER — Encounter (HOSPITAL_COMMUNITY): Payer: Self-pay

## 2014-06-25 ENCOUNTER — Encounter (INDEPENDENT_AMBULATORY_CARE_PROVIDER_SITE_OTHER): Payer: Self-pay

## 2014-06-25 ENCOUNTER — Encounter (HOSPITAL_COMMUNITY)
Admission: RE | Admit: 2014-06-25 | Discharge: 2014-06-25 | Disposition: A | Discharge status: Home / Self Care | Payer: BC Managed Care – PPO | Source: Ambulatory Visit | Attending: Surgery | Admitting: Surgery

## 2014-06-25 DIAGNOSIS — Z01818 Encounter for other preprocedural examination: Secondary | ICD-10-CM | POA: Insufficient documentation

## 2014-06-25 HISTORY — DX: Other specified postprocedural states: R11.2

## 2014-06-25 HISTORY — DX: Other specified postprocedural states: Z98.890

## 2014-06-25 LAB — ABO/RH: ABO/RH(D): A POS

## 2014-06-25 LAB — BASIC METABOLIC PANEL
ANION GAP: 13 (ref 5–15)
BUN: 11 mg/dL (ref 6–23)
CO2: 28 meq/L (ref 19–32)
Calcium: 9.9 mg/dL (ref 8.4–10.5)
Chloride: 102 mEq/L (ref 96–112)
Creatinine, Ser: 0.79 mg/dL (ref 0.50–1.10)
GFR calc Af Amer: >90 mL/min (ref >90)
Glucose, Bld: 76 mg/dL (ref 70–99)
Potassium: 5.2 mEq/L (ref 3.7–5.3)
SODIUM: 143 meq/L (ref 137–147)

## 2014-06-25 LAB — HEMOGLOBIN A1C
HEMOGLOBIN A1C: 5.1 % (ref <5.7)
Mean Plasma Glucose: 100 mg/dL (ref <117)

## 2014-06-25 LAB — CBC
HCT: 40.4 % (ref 36.0–46.0)
Hemoglobin: 13.3 g/dL (ref 12.0–15.0)
MCH: 31 pg (ref 26.0–34.0)
MCHC: 32.9 g/dL (ref 30.0–36.0)
MCV: 94.2 fL (ref 78.0–100.0)
PLATELETS: 209 10*3/uL (ref 150–400)
RBC: 4.29 MIL/uL (ref 3.87–5.11)
RDW: 12.5 % (ref 11.5–15.5)
WBC: 4.2 10*3/uL (ref 4.0–10.5)

## 2014-06-25 NOTE — Patient Instructions (Addendum)
FOLLOW ANY BOWEL PREP INSTRUCTIONS FROM YOUR DOCTOR'S OFFICE DAY BEFORE SURGERY  YOUR SURGERY IS SCHEDULED AT [facility]  ON:  Thursday   December 10th  REPORT TO  SHORT STAY CENTER AT:    11:00 AM   DO NOT EAT  ANYTHING AFTER MIDNIGHT THE NIGHT BEFORE YOUR SURGERY.   NO FOOD, NO CHEWING GUM, NO MINTS, NO CANDIES, NO CHEWING TOBACCO. YOU MAY HAVE CLEAR LIQUIDS TO DRINK FROM MIDNIGHT UNTIL 7:00 AM DAY OF YOUR SURGERY - LIKE WATER, COFFEE ( JUST NO MILK OR MILK PROBLEMS ), CRANBERRY JUICE.           NOTHING TO DRINK AFTER 7:00 AM DAY OF YOUR SURGERY.  PLEASE TAKE THE FOLLOWING MEDICATIONS THE AM OF YOUR SURGERY WITH A FEW SIPS OF WATER:  WELLBUTRIN, XANAX IF NEEDED.  AZITHROMYCIN AND THERA TEAR EYE DROPS    DO NOT BRING VALUABLES, MONEY, CREDIT CARDS.  DO NOT WEAR JEWELRY, MAKE-UP, NAIL POLISH AND NO METAL PINS OR CLIPS IN YOUR HAIR. CONTACT LENS, DENTURES / PARTIALS, GLASSES SHOULD NOT BE WORN TO SURGERY AND IN MOST CASES-HEARING AIDS WILL NEED TO BE REMOVED.  BRING YOUR GLASSES CASE, ANY EQUIPMENT NEEDED FOR YOUR CONTACT LENS. FOR PATIENTS ADMITTED TO THE HOSPITAL--CHECK OUT TIME THE DAY OF DISCHARGE IS 11:00 AM.  ALL INPATIENT ROOMS ARE PRIVATE - WITH BATHROOM, TELEPHONE, TELEVISION AND WIFI INTERNET.    PLEASE BE AWARE THAT YOU MAY NEED ADDITIONAL BLOOD DRAWN DAY OF YOUR SURGERY  _______________________________________________________________________   [facility] - Preparing for Surgery Before surgery, you can play an important role.  Because skin is not sterile, your skin needs to be as free of germs as possible.  You can reduce the number of germs on your skin by washing with CHG (chlorahexidine gluconate) soap before surgery.  CHG is an antiseptic cleaner which kills germs and bonds with the skin to continue killing germs even after washing. Please DO NOT use if you have an allergy to CHG or antibacterial soaps.  If your skin becomes reddened/irritated stop using the  CHG and inform your nurse when you arrive at Short Stay. Do not shave (including legs and underarms) for at least 48 hours prior to the first CHG shower.  You may shave your face/neck. Please follow these instructions carefully:  1.  Shower with CHG Soap the night before surgery and the  morning of Surgery.  2.  If you choose to wash your hair, wash your hair first as usual with your  normal  shampoo.  3.  After you shampoo, rinse your hair and body thoroughly to remove the  shampoo.                           4.  Use CHG as you would any other liquid soap.  You can apply chg directly  to the skin and wash                       Gently with a scrungie or clean washcloth.  5.  Apply the CHG Soap to your body ONLY FROM THE NECK DOWN.   Do not use on face/ open                           Wound or open sores. Avoid contact with eyes, ears mouth and genitals (private parts).  Wash face,  Genitals (private parts) with your normal soap.             6.  Wash thoroughly, paying special attention to the area where your surgery  will be performed.  7.  Thoroughly rinse your body with warm water from the neck down.  8.  DO NOT shower/wash with your normal soap after using and rinsing off  the CHG Soap.                9.  Pat yourself dry with a clean towel.            10.  Wear clean pajamas.            11.  Place clean sheets on your bed the night of your first shower and do not  sleep with pets. Day of Surgery : Do not apply any lotions/deodorants the morning of surgery.  Please wear clean clothes to the hospital/surgery center.  FAILURE TO FOLLOW THESE INSTRUCTIONS MAY RESULT IN THE CANCELLATION OF YOUR SURGERY PATIENT SIGNATURE_________________________________  NURSE SIGNATURE__________________________________  ________________________________________________________________________  WHAT IS A BLOOD TRANSFUSION? Blood Transfusion Information  A transfusion is the replacement of  blood or some of its parts. Blood is made up of multiple cells which provide different functions.  Red blood cells carry oxygen and are used for blood loss replacement.  White blood cells fight against infection.  Platelets control bleeding.  Plasma helps clot blood.  Other blood products are available for specialized needs, such as hemophilia or other clotting disorders. BEFORE THE TRANSFUSION  Who gives blood for transfusions?   Healthy volunteers who are fully evaluated to make sure their blood is safe. This is blood bank blood. Transfusion therapy is the safest it has ever been in the practice of medicine. Before blood is taken from a donor, a complete history is taken to make sure that person has no history of diseases nor engages in risky social behavior (examples are intravenous drug use or sexual activity with multiple partners). The donor's travel history is screened to minimize risk of transmitting infections, such as malaria. The donated blood is tested for signs of infectious diseases, such as HIV and hepatitis. The blood is then tested to be sure it is compatible with you in order to minimize the chance of a transfusion reaction. If you or a relative donates blood, this is often done in anticipation of surgery and is not appropriate for emergency situations. It takes many days to process the donated blood. RISKS AND COMPLICATIONS Although transfusion therapy is very safe and saves many lives, the main dangers of transfusion include:   Getting an infectious disease.  Developing a transfusion reaction. This is an allergic reaction to something in the blood you were given. Every precaution is taken to prevent this. The decision to have a blood transfusion has been considered carefully by your caregiver before blood is given. Blood is not given unless the benefits outweigh the risks. AFTER THE TRANSFUSION  Right after receiving a blood transfusion, you will usually feel much better  and more energetic. This is especially true if your red blood cells have gotten low (anemic). The transfusion raises the level of the red blood cells which carry oxygen, and this usually causes an energy increase.  The nurse administering the transfusion will monitor you carefully for complications. HOME CARE INSTRUCTIONS  No special instructions are needed after a transfusion. You may find your energy is better. Speak with your caregiver about any  limitations on activity for underlying diseases you may have. SEEK MEDICAL CARE IF:   Your condition is not improving after your transfusion.  You develop redness or irritation at the intravenous (IV) site. SEEK IMMEDIATE MEDICAL CARE IF:  Any of the following symptoms occur over the next 12 hours:  Shaking chills.  You have a temperature by mouth above 102 F (38.9 C), not controlled by medicine.  Chest, back, or muscle pain.  People around you feel you are not acting correctly or are confused.  Shortness of breath or difficulty breathing.  Dizziness and fainting.  You get a rash or develop hives.  You have a decrease in urine output.  Your urine turns a dark color or changes to pink, red, or brown. Any of the following symptoms occur over the next 10 days:  You have a temperature by mouth above 102 F (38.9 C), not controlled by medicine.  Shortness of breath.  Weakness after normal activity.  The white part of the eye turns yellow (jaundice).  You have a decrease in the amount of urine or are urinating less often.  Your urine turns a dark color or changes to pink, red, or brown. Document Released: 07/07/2000 Document Revised: 10/02/2011 Document Reviewed: 02/24/2008 [facility] Patient Information 2014 [place], [place].  _______________________________________________________________________

## 2014-06-25 NOTE — Consult Note (Signed)
WOC ostomy consult note Preoperative stoma site selection per Dr. Johney [place]' request.  He requests a marking for both a permanent colostomy and a temporary ileostomy. DOS is 07/02/14. Patient's abdomen is assessed in the sitting and standing positions.  Trim abdomen with little adipose tissue noted.  Easily palpable, albeit narrow rectus muscle is noted.. Patient is marked within rectus muscle using a surgical skin marking pen and the marks are covered with thin film transparent dressings.  The right ileostomy site is marked 5.5cm to the right of the umbilicus and 3.6GO below.  The left colostomy site is marked 5.5cm to the left of the umbilicus and 2cm below. Patient is aware that there is an ostomy nurse resource in the event an ostomy is created intraoperatively.  She is hopeful that this will not be necessary. [place] nursing team will not follow, but will remain available to this patient, the nursing, surgical and medical team.  Please re-consult if an ostomy is created.. Thanks, Maudie Flakes, MSN, RN, [place], [place], [place] [phone])

## 2014-06-25 NOTE — Pre-Procedure Instructions (Addendum)
EKG AND CXR NOT NEEDED PER ANESTHESIOLOGIST'S GUIDELINES. PT'S CHART GIVEN TO FOLLOW UP NURSE WILHEMINA HENDRICK, RN FOR REVIEW OF ALL PREOP TEST RESULTS.

## 2014-07-02 ENCOUNTER — Inpatient Hospital Stay (HOSPITAL_COMMUNITY): Payer: BC Managed Care – PPO | Admitting: Anesthesiology

## 2014-07-02 ENCOUNTER — Inpatient Hospital Stay (HOSPITAL_COMMUNITY)
Admission: RE | Admit: 2014-07-02 | Discharge: 2014-07-06 | DRG: 331 | Disposition: A | Discharge status: Home / Self Care | Payer: BC Managed Care – PPO | Source: Ambulatory Visit | Attending: Surgery | Admitting: Surgery

## 2014-07-02 ENCOUNTER — Encounter (HOSPITAL_COMMUNITY): Payer: Self-pay | Admitting: Certified Registered Nurse Anesthetist

## 2014-07-02 ENCOUNTER — Encounter (HOSPITAL_COMMUNITY)
Admission: RE | Disposition: A | Discharge status: Home / Self Care | Payer: Self-pay | Source: Ambulatory Visit | Attending: Surgery

## 2014-07-02 DIAGNOSIS — Z880 Allergy status to penicillin: Secondary | ICD-10-CM

## 2014-07-02 DIAGNOSIS — F419 Anxiety disorder, unspecified: Secondary | ICD-10-CM | POA: Diagnosis present

## 2014-07-02 DIAGNOSIS — K5792 Diverticulitis of intestine, part unspecified, without perforation or abscess without bleeding: Secondary | ICD-10-CM | POA: Diagnosis present

## 2014-07-02 DIAGNOSIS — K259 Gastric ulcer, unspecified as acute or chronic, without hemorrhage or perforation: Secondary | ICD-10-CM | POA: Diagnosis present

## 2014-07-02 DIAGNOSIS — N200 Calculus of kidney: Secondary | ICD-10-CM | POA: Diagnosis present

## 2014-07-02 DIAGNOSIS — D1803 Hemangioma of intra-abdominal structures: Secondary | ICD-10-CM | POA: Diagnosis present

## 2014-07-02 DIAGNOSIS — K219 Gastro-esophageal reflux disease without esophagitis: Secondary | ICD-10-CM | POA: Diagnosis present

## 2014-07-02 DIAGNOSIS — K5732 Diverticulitis of large intestine without perforation or abscess without bleeding: Secondary | ICD-10-CM | POA: Diagnosis present

## 2014-07-02 DIAGNOSIS — F418 Other specified anxiety disorders: Secondary | ICD-10-CM | POA: Diagnosis present

## 2014-07-02 DIAGNOSIS — J309 Allergic rhinitis, unspecified: Secondary | ICD-10-CM | POA: Diagnosis present

## 2014-07-02 DIAGNOSIS — D1809 Hemangioma of other sites: Secondary | ICD-10-CM | POA: Diagnosis present

## 2014-07-02 HISTORY — PX: PROCTOSCOPY: SHX2266

## 2014-07-02 LAB — TYPE AND SCREEN
ABO/RH(D): A POS
Antibody Screen: NEGATIVE

## 2014-07-02 SURGERY — COLECTOMY, PARTIAL, ROBOT-ASSISTED, LAPAROSCOPIC
Anesthesia: General

## 2014-07-02 MED ORDER — 0.9 % SODIUM CHLORIDE (POUR BTL) OPTIME
TOPICAL | Status: DC | PRN
  Administered 2014-07-02: 2000 mL

## 2014-07-02 MED ORDER — CHLORHEXIDINE GLUCONATE 4 % EX LIQD: 60.0000 mL | Freq: Once | CUTANEOUS | Status: DC

## 2014-07-02 MED ORDER — AZITHROMYCIN 1 % OP SOLN
1.0000 [drp] | Freq: Two times a day (BID) | OPHTHALMIC | Status: DC
  Administered 2014-07-03 – 2014-07-06 (×5): 1 [drp] via OPHTHALMIC

## 2014-07-02 MED ORDER — ACETAMINOPHEN 500 MG PO TABS
1000.0000 mg | ORAL_TABLET | Freq: Three times a day (TID) | ORAL | Status: DC
Start: 2014-07-03 — End: 2014-07-03
  Filled 2014-07-02 (×4): qty 2

## 2014-07-02 MED ORDER — LACTATED RINGERS IV SOLN
INTRAVENOUS | Status: DC
  Administered 2014-07-02: 1000 mL via INTRAVENOUS

## 2014-07-02 MED ORDER — BUPIVACAINE-EPINEPHRINE 0.25% -1:200000 IJ SOLN
INTRAMUSCULAR | Status: AC
  Filled 2014-07-02: qty 1

## 2014-07-02 MED ORDER — HYDROMORPHONE HCL 1 MG/ML IJ SOLN
0.2500 mg | INTRAMUSCULAR | Status: DC | PRN
  Administered 2014-07-02 (×4): 0.5 mg via INTRAVENOUS

## 2014-07-02 MED ORDER — CLINDAMYCIN PHOSPHATE 900 MG/50ML IV SOLN
900.0000 mg | INTRAVENOUS | Status: AC
  Administered 2014-07-02: 900 mg via INTRAVENOUS

## 2014-07-02 MED ORDER — LACTATED RINGERS IR SOLN
Status: DC | PRN
  Administered 2014-07-02: 1000 mL

## 2014-07-02 MED ORDER — LIP MEDEX EX OINT
1.0000 "application " | TOPICAL_OINTMENT | Freq: Two times a day (BID) | CUTANEOUS | Status: DC
  Administered 2014-07-02 – 2014-07-06 (×6): 1 via TOPICAL

## 2014-07-02 MED ORDER — SCOPOLAMINE 1 MG/3DAYS TD PT72
MEDICATED_PATCH | TRANSDERMAL | Status: DC | PRN
  Administered 2014-07-02: 1 via TRANSDERMAL

## 2014-07-02 MED ORDER — CLINDAMYCIN PHOSPHATE 900 MG/50ML IV SOLN
INTRAVENOUS | Status: AC
  Filled 2014-07-02: qty 50

## 2014-07-02 MED ORDER — IBUPROFEN 200 MG PO TABS
400.0000 mg | ORAL_TABLET | Freq: Four times a day (QID) | ORAL | Status: DC | PRN
  Administered 2014-07-03 – 2014-07-04 (×2): 400 mg via ORAL
  Filled 2014-07-02 (×3): qty 2

## 2014-07-02 MED ORDER — OXYCODONE HCL 5 MG PO TABS
5.0000 mg | ORAL_TABLET | ORAL | Status: DC | PRN
  Administered 2014-07-05: 10 mg via ORAL
  Filled 2014-07-02: qty 2

## 2014-07-02 MED ORDER — LACTATED RINGERS IV BOLUS (SEPSIS): 1000.0000 mL | Freq: Three times a day (TID) | INTRAVENOUS | Status: AC | PRN

## 2014-07-02 MED ORDER — ZOLPIDEM TARTRATE 5 MG PO TABS
5.0000 mg | ORAL_TABLET | Freq: Every evening | ORAL | Status: DC | PRN
  Administered 2014-07-05: 5 mg via ORAL
  Filled 2014-07-02: qty 1

## 2014-07-02 MED ORDER — HEPARIN SODIUM (PORCINE) 5000 UNIT/ML IJ SOLN
5000.0000 [IU] | Freq: Three times a day (TID) | INTRAMUSCULAR | Status: DC
  Filled 2014-07-02 (×2): qty 1

## 2014-07-02 MED ORDER — ALPRAZOLAM 0.25 MG PO TABS: 0.2500 mg | ORAL_TABLET | Freq: Three times a day (TID) | ORAL | Status: DC | PRN

## 2014-07-02 MED ORDER — LIDOCAINE HCL (CARDIAC) 20 MG/ML IV SOLN
INTRAVENOUS | Status: DC | PRN
  Administered 2014-07-02: 50 mg via INTRAVENOUS

## 2014-07-02 MED ORDER — BUPIVACAINE-EPINEPHRINE (PF) 0.25% -1:200000 IJ SOLN
INTRAMUSCULAR | Status: AC
  Filled 2014-07-02: qty 30

## 2014-07-02 MED ORDER — SODIUM CHLORIDE 0.9 % IV SOLN
INTRAVENOUS | Status: DC
  Administered 2014-07-03 – 2014-07-04 (×3): via INTRAVENOUS

## 2014-07-02 MED ORDER — ADULT MULTIVITAMIN W/MINERALS CH
1.0000 | ORAL_TABLET | Freq: Every day | ORAL | Status: DC
  Administered 2014-07-03 – 2014-07-06 (×4): 1 via ORAL
  Filled 2014-07-02 (×4): qty 1

## 2014-07-02 MED ORDER — PROMETHAZINE HCL 25 MG/ML IJ SOLN
6.2500 mg | INTRAMUSCULAR | Status: DC | PRN
Start: 2014-07-02 — End: 2014-07-06

## 2014-07-02 MED ORDER — KETOTIFEN FUMARATE 0.025 % OP SOLN
1.0000 [drp] | Freq: Two times a day (BID) | OPHTHALMIC | Status: DC
Start: 2014-07-02 — End: 2014-07-02
  Filled 2014-07-02: qty 5

## 2014-07-02 MED ORDER — HYDROMORPHONE HCL 1 MG/ML IJ SOLN
0.5000 mg | INTRAMUSCULAR | Status: DC | PRN
Start: 2014-07-02 — End: 2014-07-06
  Administered 2014-07-02: 1 mg via INTRAVENOUS
  Administered 2014-07-03 (×3): 0.5 mg via INTRAVENOUS
  Filled 2014-07-02 (×5): qty 1

## 2014-07-02 MED ORDER — METOCLOPRAMIDE HCL 5 MG/ML IJ SOLN
INTRAMUSCULAR | Status: DC | PRN
  Administered 2014-07-02: 5 mg via INTRAVENOUS

## 2014-07-02 MED ORDER — GENTAMICIN SULFATE 40 MG/ML IJ SOLN
5.0000 mg/kg | INTRAVENOUS | Status: AC
  Administered 2014-07-02: 260 mg via INTRAVENOUS
  Filled 2014-07-02: qty 6.5

## 2014-07-02 MED ORDER — ONDANSETRON HCL 4 MG PO TABS
4.0000 mg | ORAL_TABLET | Freq: Four times a day (QID) | ORAL | Status: DC | PRN
  Administered 2014-07-06: 4 mg via ORAL
  Filled 2014-07-02: qty 1

## 2014-07-02 MED ORDER — PROMETHAZINE HCL 25 MG/ML IJ SOLN: 6.2500 mg | INTRAMUSCULAR | Status: DC | PRN

## 2014-07-02 MED ORDER — SCOPOLAMINE 1 MG/3DAYS TD PT72
1.0000 | MEDICATED_PATCH | Freq: Once | TRANSDERMAL | Status: DC
  Administered 2014-07-02: 1.5 mg via TRANSDERMAL
  Filled 2014-07-02: qty 1

## 2014-07-02 MED ORDER — BUPIVACAINE-EPINEPHRINE 0.25% -1:200000 IJ SOLN
INTRAMUSCULAR | Status: DC | PRN
  Administered 2014-07-02: 50 mL

## 2014-07-02 MED ORDER — PROPOFOL 10 MG/ML IV BOLUS
INTRAVENOUS | Status: AC
  Filled 2014-07-02: qty 20

## 2014-07-02 MED ORDER — AZITHROMYCIN 1 % OP SOLN: 1.0000 [drp] | Freq: Two times a day (BID) | OPHTHALMIC | Status: DC

## 2014-07-02 MED ORDER — MIDAZOLAM HCL 2 MG/2ML IJ SOLN
INTRAMUSCULAR | Status: AC
  Filled 2014-07-02: qty 2

## 2014-07-02 MED ORDER — OXYCODONE HCL 5 MG PO TABS: 5.0000 mg | ORAL_TABLET | ORAL | Status: DC | PRN

## 2014-07-02 MED ORDER — DIPHENHYDRAMINE HCL 50 MG/ML IJ SOLN: 12.5000 mg | Freq: Four times a day (QID) | INTRAMUSCULAR | Status: DC | PRN

## 2014-07-02 MED ORDER — NEOSTIGMINE METHYLSULFATE 10 MG/10ML IV SOLN
INTRAVENOUS | Status: DC | PRN
  Administered 2014-07-02: 1 mg via INTRAVENOUS

## 2014-07-02 MED ORDER — BUPROPION HCL ER (XL) 150 MG PO TB24
150.0000 mg | ORAL_TABLET | Freq: Every day | ORAL | Status: DC
  Administered 2014-07-03 – 2014-07-06 (×4): 150 mg via ORAL
  Filled 2014-07-02 (×4): qty 1

## 2014-07-02 MED ORDER — ONDANSETRON HCL 4 MG/2ML IJ SOLN
4.0000 mg | Freq: Four times a day (QID) | INTRAMUSCULAR | Status: DC | PRN
  Administered 2014-07-02 – 2014-07-05 (×3): 4 mg via INTRAVENOUS
  Filled 2014-07-02 (×3): qty 2

## 2014-07-02 MED ORDER — EPHEDRINE SULFATE 50 MG/ML IJ SOLN
INTRAMUSCULAR | Status: DC | PRN
  Administered 2014-07-02 (×4): 5 mg via INTRAVENOUS

## 2014-07-02 MED ORDER — MENTHOL 3 MG MT LOZG: 1.0000 | LOZENGE | OROMUCOSAL | Status: DC | PRN

## 2014-07-02 MED ORDER — MAGIC MOUTHWASH
15.0000 mL | Freq: Four times a day (QID) | ORAL | Status: DC | PRN
  Filled 2014-07-02: qty 15

## 2014-07-02 MED ORDER — HEPARIN SODIUM (PORCINE) 5000 UNIT/ML IJ SOLN
5000.0000 [IU] | Freq: Once | INTRAMUSCULAR | Status: AC
  Administered 2014-07-02: 5000 [IU] via SUBCUTANEOUS
  Filled 2014-07-02: qty 1

## 2014-07-02 MED ORDER — CISATRACURIUM BESYLATE (PF) 10 MG/5ML IV SOLN
INTRAVENOUS | Status: DC | PRN
  Administered 2014-07-02: 4 mg via INTRAVENOUS
  Administered 2014-07-02: 8 mg via INTRAVENOUS

## 2014-07-02 MED ORDER — METOPROLOL TARTRATE 1 MG/ML IV SOLN
5.0000 mg | Freq: Four times a day (QID) | INTRAVENOUS | Status: DC
  Filled 2014-07-02 (×7): qty 5

## 2014-07-02 MED ORDER — ALUM & MAG HYDROXIDE-SIMETH 200-200-20 MG/5ML PO SUSP: 30.0000 mL | Freq: Four times a day (QID) | ORAL | Status: DC | PRN

## 2014-07-02 MED ORDER — ALVIMOPAN 12 MG PO CAPS
12.0000 mg | ORAL_CAPSULE | Freq: Two times a day (BID) | ORAL | Status: DC
  Administered 2014-07-03 (×2): 12 mg via ORAL
  Filled 2014-07-02 (×4): qty 1

## 2014-07-02 MED ORDER — DIPHENHYDRAMINE HCL 12.5 MG/5ML PO ELIX: 12.5000 mg | ORAL_SOLUTION | Freq: Four times a day (QID) | ORAL | Status: DC | PRN

## 2014-07-02 MED ORDER — HYDROMORPHONE HCL 1 MG/ML IJ SOLN
INTRAMUSCULAR | Status: AC
  Administered 2014-07-03: 0.5 mg via INTRAVENOUS
  Filled 2014-07-02: qty 1

## 2014-07-02 MED ORDER — SUCCINYLCHOLINE CHLORIDE 20 MG/ML IJ SOLN
INTRAMUSCULAR | Status: DC | PRN
  Administered 2014-07-02: 100 mg via INTRAVENOUS

## 2014-07-02 MED ORDER — ONE-DAILY MULTI VITAMINS PO TABS: 1.0000 | ORAL_TABLET | Freq: Every morning | ORAL | Status: DC

## 2014-07-02 MED ORDER — PROPOFOL INFUSION 10 MG/ML OPTIME
INTRAVENOUS | Status: DC | PRN
  Administered 2014-07-02: 60 ug/kg/min via INTRAVENOUS

## 2014-07-02 MED ORDER — SODIUM CHLORIDE 0.9 % IV SOLN
INTRAVENOUS | Status: DC
  Filled 2014-07-02: qty 6

## 2014-07-02 MED ORDER — FENTANYL CITRATE 0.05 MG/ML IJ SOLN
INTRAMUSCULAR | Status: DC | PRN
  Administered 2014-07-02: 100 ug via INTRAVENOUS
  Administered 2014-07-02 (×8): 50 ug via INTRAVENOUS

## 2014-07-02 MED ORDER — SODIUM CHLORIDE 0.9 % IV SOLN
INTRAVENOUS | Status: DC | PRN
  Administered 2014-07-02: 1000 mL

## 2014-07-02 MED ORDER — FENTANYL CITRATE 0.05 MG/ML IJ SOLN
INTRAMUSCULAR | Status: AC
  Filled 2014-07-02: qty 5

## 2014-07-02 MED ORDER — SODIUM CHLORIDE 0.9 % IV SOLN
INTRAVENOUS | Status: DC | PRN
  Administered 2014-07-02 (×3): via INTRAVENOUS

## 2014-07-02 MED ORDER — GLYCOPYRROLATE 0.2 MG/ML IJ SOLN
INTRAMUSCULAR | Status: DC | PRN
  Administered 2014-07-02: .2 mg via INTRAVENOUS

## 2014-07-02 MED ORDER — SACCHAROMYCES BOULARDII 250 MG PO CAPS
250.0000 mg | ORAL_CAPSULE | Freq: Two times a day (BID) | ORAL | Status: DC
  Administered 2014-07-02 – 2014-07-06 (×7): 250 mg via ORAL
  Filled 2014-07-02 (×9): qty 1

## 2014-07-02 MED ORDER — DEXAMETHASONE SODIUM PHOSPHATE 10 MG/ML IJ SOLN
INTRAMUSCULAR | Status: DC | PRN
  Administered 2014-07-02: 10 mg via INTRAVENOUS

## 2014-07-02 MED ORDER — ONDANSETRON HCL 4 MG/2ML IJ SOLN
INTRAMUSCULAR | Status: DC | PRN
  Administered 2014-07-02: 2 mg via INTRAVENOUS
  Administered 2014-07-02: 4 mg via INTRAVENOUS
  Administered 2014-07-02: 2 mg via INTRAVENOUS

## 2014-07-02 MED ORDER — PROPOFOL 10 MG/ML IV BOLUS
INTRAVENOUS | Status: DC | PRN
  Administered 2014-07-02: 150 mg via INTRAVENOUS

## 2014-07-02 MED ORDER — ALVIMOPAN 12 MG PO CAPS
12.0000 mg | ORAL_CAPSULE | Freq: Once | ORAL | Status: AC
  Administered 2014-07-02: 12 mg via ORAL
  Filled 2014-07-02: qty 1

## 2014-07-02 MED ORDER — ONDANSETRON HCL 4 MG/2ML IJ SOLN
INTRAMUSCULAR | Status: AC
  Filled 2014-07-02: qty 2

## 2014-07-02 MED ORDER — HYDROMORPHONE HCL 1 MG/ML IJ SOLN
INTRAMUSCULAR | Status: AC
  Filled 2014-07-02: qty 1

## 2014-07-02 MED ORDER — HEPARIN SODIUM (PORCINE) 5000 UNIT/ML IJ SOLN
5000.0000 [IU] | Freq: Three times a day (TID) | INTRAMUSCULAR | Status: DC
  Administered 2014-07-03 – 2014-07-04 (×4): 5000 [IU] via SUBCUTANEOUS
  Filled 2014-07-02 (×8): qty 1

## 2014-07-02 MED ORDER — MIDAZOLAM HCL 5 MG/5ML IJ SOLN
INTRAMUSCULAR | Status: DC | PRN
  Administered 2014-07-02 (×2): 1 mg via INTRAVENOUS

## 2014-07-02 MED ORDER — CLINDAMYCIN PHOSPHATE 900 MG/50ML IV SOLN
900.0000 mg | Freq: Three times a day (TID) | INTRAVENOUS | Status: AC
  Administered 2014-07-02: 900 mg via INTRAVENOUS
  Filled 2014-07-02: qty 50

## 2014-07-02 MED ORDER — SCOPOLAMINE 1 MG/3DAYS TD PT72
MEDICATED_PATCH | TRANSDERMAL | Status: AC
  Filled 2014-07-02: qty 1

## 2014-07-02 MED ORDER — KETOTIFEN FUMARATE 0.025 % OP SOLN
1.0000 [drp] | Freq: Two times a day (BID) | OPHTHALMIC | Status: DC
  Administered 2014-07-06: 1 [drp] via OPHTHALMIC

## 2014-07-02 MED ORDER — PHENOL 1.4 % MT LIQD
2.0000 | OROMUCOSAL | Status: DC | PRN
Start: 2014-07-02 — End: 2014-07-06

## 2014-07-02 MED ORDER — ACETAMINOPHEN 10 MG/ML IV SOLN
1000.0000 mg | Freq: Once | INTRAVENOUS | Status: AC
  Administered 2014-07-02: 1000 mg via INTRAVENOUS
  Filled 2014-07-02: qty 100

## 2014-07-02 SURGICAL SUPPLY — 121 items
APPLIER CLIP 5 13 M/L LIGAMAX5 (MISCELLANEOUS)
APPLIER CLIP ROT 10 11.4 M/L (STAPLE)
APR CLP MED LRG 11.4X10 (STAPLE)
APR CLP MED LRG 5 ANG JAW (MISCELLANEOUS)
BLADE EXTENDED COATED 6.5IN (ELECTRODE) ×4 IMPLANT
BLADE SURG SZ11 CARB STEEL (BLADE) ×4 IMPLANT
CABLE HIGH FREQUENCY MONO STRZ (ELECTRODE) IMPLANT
CANNULA REDUC XI 12-8 STAPL (CANNULA) ×1
CANNULA REDUC XI 12-8MM STAPL (CANNULA) ×1
CANNULA REDUCER 12-8 DVNC XI (CANNULA) IMPLANT
CATH KIT ON-Q SILVERSOAK 7.5 (CATHETERS) IMPLANT
CATH KIT ON-Q SILVERSOAK 7.5IN (CATHETERS) IMPLANT
CATH ROBINSON RED A/P 22FR (CATHETERS) IMPLANT
CELLS DAT CNTRL 66122 CELL SVR (MISCELLANEOUS) IMPLANT
CHLORAPREP W/TINT 26ML (MISCELLANEOUS) ×4 IMPLANT
CLIP APPLIE 5 13 M/L LIGAMAX5 (MISCELLANEOUS) IMPLANT
CLIP APPLIE ROT 10 11.4 M/L (STAPLE) IMPLANT
CLIP LIGATING HEM O LOK PURPLE (MISCELLANEOUS) ×2 IMPLANT
CLIP LIGATING HEMO O LOK GREEN (MISCELLANEOUS) IMPLANT
CLIP LIGATING HEMOLOK MED (MISCELLANEOUS) IMPLANT
COVER MAYO STAND STRL (DRAPES) ×4 IMPLANT
COVER TIP SHEARS 8 DVNC (MISCELLANEOUS) ×2 IMPLANT
COVER TIP SHEARS 8MM DA VINCI (MISCELLANEOUS) ×2
DECANTER SPIKE VIAL GLASS SM (MISCELLANEOUS) ×8 IMPLANT
DEVICE TROCAR PUNCTURE CLOSURE (ENDOMECHANICALS) IMPLANT
DRAIN CHANNEL 19F RND (DRAIN) IMPLANT
DRAPE ARM DVNC X/XI (DISPOSABLE) ×8 IMPLANT
DRAPE COLUMN DVNC XI (DISPOSABLE) ×2 IMPLANT
DRAPE DA VINCI XI ARM (DISPOSABLE) ×8
DRAPE DA VINCI XI COLUMN (DISPOSABLE) ×2
DRAPE SHEET LG 3/4 BI-LAMINATE (DRAPES) ×4 IMPLANT
DRAPE SURG IRRIG POUCH 19X23 (DRAPES) ×4 IMPLANT
DRAPE WARM FLUID 44X44 (DRAPE) ×4 IMPLANT
DRSG OPSITE POSTOP 4X10 (GAUZE/BANDAGES/DRESSINGS) IMPLANT
DRSG OPSITE POSTOP 4X6 (GAUZE/BANDAGES/DRESSINGS) ×2 IMPLANT
DRSG OPSITE POSTOP 4X8 (GAUZE/BANDAGES/DRESSINGS) IMPLANT
DRSG TEGADERM 2-3/8X2-3/4 SM (GAUZE/BANDAGES/DRESSINGS) ×6 IMPLANT
DRSG TEGADERM 4X4.75 (GAUZE/BANDAGES/DRESSINGS) IMPLANT
ELECT PENCIL ROCKER SW 15FT (MISCELLANEOUS) ×4 IMPLANT
ELECT REM PT RETURN 9FT ADLT (ELECTROSURGICAL) ×4
ELECTRODE REM PT RTRN 9FT ADLT (ELECTROSURGICAL) ×2 IMPLANT
ENDOLOOP SUT PDS II  0 18 (SUTURE)
ENDOLOOP SUT PDS II 0 18 (SUTURE) IMPLANT
EVACUATOR SILICONE 100CC (DRAIN) IMPLANT
GAUZE SPONGE 2X2 8PLY STRL LF (GAUZE/BANDAGES/DRESSINGS) ×2 IMPLANT
GAUZE SPONGE 4X4 12PLY STRL (GAUZE/BANDAGES/DRESSINGS) IMPLANT
GLOVE ECLIPSE 8.0 STRL XLNG CF (GLOVE) ×12 IMPLANT
GLOVE INDICATOR 8.0 STRL GRN (GLOVE) ×12 IMPLANT
GOWN STRL REUS W/TWL XL LVL3 (GOWN DISPOSABLE) ×20 IMPLANT
KIT BASIN OR (CUSTOM PROCEDURE TRAY) ×8 IMPLANT
KIT PROCEDURE DA VINCI SI (MISCELLANEOUS)
KIT PROCEDURE DVNC SI (MISCELLANEOUS) ×2 IMPLANT
LEGGING LITHOTOMY PAIR STRL (DRAPES) ×4 IMPLANT
LUBRICANT JELLY K Y 4OZ (MISCELLANEOUS) ×2 IMPLANT
NDL INSUFFLATION 14GA 120MM (NEEDLE) ×2 IMPLANT
NEEDLE HYPO 22GX1.5 SAFETY (NEEDLE) ×4 IMPLANT
NEEDLE INSUFFLATION 14GA 120MM (NEEDLE) ×4 IMPLANT
PACK CARDIOVASCULAR III (CUSTOM PROCEDURE TRAY) ×4 IMPLANT
PACK GENERAL/GYN (CUSTOM PROCEDURE TRAY) ×4 IMPLANT
PEN SKIN MARKING BROAD (MISCELLANEOUS) ×4 IMPLANT
PORT LAP GEL ALEXIS MED 5-9CM (MISCELLANEOUS) ×2 IMPLANT
PUMP PAIN ON-Q (MISCELLANEOUS) IMPLANT
RELOAD STAPLE 45 BLU REG DVNC (STAPLE) IMPLANT
RELOAD STAPLE 45 GRN THCK DVNC (STAPLE) IMPLANT
RETRACTOR WND ALEXIS 18 MED (MISCELLANEOUS) IMPLANT
RTRCTR WOUND ALEXIS 18CM MED (MISCELLANEOUS)
SCISSORS LAP 5X45 EPIX DISP (ENDOMECHANICALS) ×2 IMPLANT
SCRUB PCMX 4 OZ (MISCELLANEOUS) ×4 IMPLANT
SEAL CANN UNIV 5-8 DVNC XI (MISCELLANEOUS) ×6 IMPLANT
SEAL XI 5MM-8MM UNIVERSAL (MISCELLANEOUS) ×6
SEALER TISSUE G2 STRG ARTC 35C (ENDOMECHANICALS) IMPLANT
SEALER VESSEL DA VINCI XI (MISCELLANEOUS)
SEALER VESSEL EXT DVNC XI (MISCELLANEOUS) IMPLANT
SET IRRIG TUBING LAPAROSCOPIC (IRRIGATION / IRRIGATOR) ×4 IMPLANT
SLEEVE XCEL OPT CAN 5 100 (ENDOMECHANICALS) IMPLANT
SOLUTION ELECTROLUBE (MISCELLANEOUS) ×4 IMPLANT
SPONGE GAUZE 2X2 STER 10/PKG (GAUZE/BANDAGES/DRESSINGS)
SPONGE LAP 18X18 X RAY DECT (DISPOSABLE) IMPLANT
STAPLER 45 BLU RELOAD XI (STAPLE) IMPLANT
STAPLER 45 BLUE RELOAD XI (STAPLE)
STAPLER 45 GREEN RELOAD XI (STAPLE)
STAPLER 45 GRN RELOAD XI (STAPLE) IMPLANT
STAPLER CANNULA SEAL DVNC XI (STAPLE) ×2 IMPLANT
STAPLER CANNULA SEAL XI (STAPLE) ×4
STAPLER CIRC ILS CVD 33MM 37CM (STAPLE) ×2 IMPLANT
STAPLER SHEATH (SHEATH) ×2
STAPLER SHEATH ENDOWRIST DVNC (SHEATH) IMPLANT
SUCTION POOLE TIP (SUCTIONS) ×6 IMPLANT
SUT MNCRL AB 4-0 PS2 18 (SUTURE) ×6 IMPLANT
SUT PDS AB 1 CTX 36 (SUTURE) ×8 IMPLANT
SUT PDS AB 1 TP1 96 (SUTURE) IMPLANT
SUT PDS AB 2-0 CT2 27 (SUTURE) IMPLANT
SUT PROLENE 0 CT 2 (SUTURE) ×2 IMPLANT
SUT PROLENE 2 0 SH DA (SUTURE) IMPLANT
SUT SILK 2 0 (SUTURE) ×4
SUT SILK 2 0 SH CR/8 (SUTURE) ×4 IMPLANT
SUT SILK 2-0 18XBRD TIE 12 (SUTURE) ×2 IMPLANT
SUT SILK 3 0 (SUTURE) ×4
SUT SILK 3 0 SH CR/8 (SUTURE) ×4 IMPLANT
SUT SILK 3-0 18XBRD TIE 12 (SUTURE) ×2 IMPLANT
SUT V-LOC BARB 180 2/0GR6 GS22 (SUTURE)
SUT VIC AB 3-0 SH 18 (SUTURE) IMPLANT
SUT VIC AB 3-0 SH 27 (SUTURE)
SUT VIC AB 3-0 SH 27XBRD (SUTURE) IMPLANT
SUT VICRYL 0 UR6 27IN ABS (SUTURE) ×4 IMPLANT
SUT VLOC 180 2-0 9IN GS21 (SUTURE) IMPLANT
SUTURE V-LC BRB 180 2/0GR6GS22 (SUTURE) IMPLANT
SYR 20CC LL (SYRINGE) ×2 IMPLANT
SYRINGE 10CC LL (SYRINGE) ×2 IMPLANT
SYS LAPSCP GELPORT 120MM (MISCELLANEOUS)
SYSTEM LAPSCP GELPORT 120MM (MISCELLANEOUS) IMPLANT
TAPE UMBILICAL COTTON 1/8X30 (MISCELLANEOUS) ×4 IMPLANT
TOWEL OR 17X26 10 PK STRL BLUE (TOWEL DISPOSABLE) ×8 IMPLANT
TOWEL OR NON WOVEN STRL DISP B (DISPOSABLE) ×4 IMPLANT
TRAY FOLEY CATH 14FRSI W/METER (CATHETERS) ×4 IMPLANT
TROCAR BLADELESS OPT 5 100 (ENDOMECHANICALS) ×4 IMPLANT
TUBING CONNECTING 10 (TUBING) IMPLANT
TUBING CONNECTING 10' (TUBING)
TUBING FILTER THERMOFLATOR (ELECTROSURGICAL) ×4 IMPLANT
TUNNELER SHEATH ON-Q 16GX12 DP (PAIN MANAGEMENT) IMPLANT
YANKAUER SUCT BULB TIP 10FT TU (MISCELLANEOUS) ×4 IMPLANT

## 2014-07-02 NOTE — Anesthesia Preprocedure Evaluation (Signed)
Anesthesia Evaluation  Patient identified by MRN, date of birth, ID band Patient awake    Reviewed: Allergy & Precautions, H&P , NPO status , Patient's Chart, lab work & pertinent test results  History of Anesthesia Complications (+) PONV and history of anesthetic complications  Airway Mallampati: II  TM Distance: >3 FB Neck ROM: Full    Dental no notable dental hx.    Pulmonary neg pulmonary ROS,  breath sounds clear to auscultation  Pulmonary exam normal       Cardiovascular negative cardio ROS  Rhythm:Regular Rate:Normal     Neuro/Psych PSYCHIATRIC DISORDERS Anxiety Depression negative neurological ROS     GI/Hepatic Neg liver ROS, hiatal hernia, GERD-  ,  Endo/Other  negative endocrine ROS  Renal/GU negative Renal ROS     Musculoskeletal negative musculoskeletal ROS (+)   Abdominal   Peds  Hematology negative hematology ROS (+)   Anesthesia Other Findings   Reproductive/Obstetrics negative OB ROS                             Anesthesia Physical Anesthesia Plan  ASA: II  Anesthesia Plan: General   Post-op Pain Management:    Induction: Intravenous  Airway Management Planned: Oral ETT  Additional Equipment: None  Intra-op Plan:   Post-operative Plan: Extubation in OR  Informed Consent: I have reviewed the patients History and Physical, chart, labs and discussed the procedure including the risks, benefits and alternatives for the proposed anesthesia with the patient or authorized representative who has indicated his/her understanding and acceptance.   Dental advisory given  Plan Discussed with: CRNA  Anesthesia Plan Comments:         Anesthesia Quick Evaluation

## 2014-07-02 NOTE — Anesthesia Postprocedure Evaluation (Signed)
Anesthesia Post Note  Patient: Jessica Chan  Procedure(s) Performed: Procedure(s) (LRB): ROBOTIC SIGMOID COLECTOMY WITH SPLENIC FLEXURE MOBILIZATION (Left) PROCTOSCOPY (N/A)  Anesthesia type: General  Patient location: PACU  Post pain: Pain level controlled  Post assessment: Post-op Vital signs reviewed  Last Vitals: BP 106/65 mmHg  Pulse 100  Temp(Src) 36.4 C (Oral)  Resp 14  Ht 5\' 5"  (1.651 m)  Wt 111 lb (50.349 kg)  BMI 18.47 kg/m2  SpO2 100%  LMP 07/25/2011  Post vital signs: Reviewed  Level of consciousness: sedated  Complications: No apparent anesthesia complications

## 2014-07-02 NOTE — Anesthesia Procedure Notes (Signed)
Procedure Name: Intubation Date/Time: 07/02/2014 1:09 PM Performed by: Ofilia Neas Pre-anesthesia Checklist: Patient identified, Timeout performed, Emergency Drugs available, Suction available and Patient being monitored Patient Re-evaluated:Patient Re-evaluated prior to inductionOxygen Delivery Method: Circle system utilized Preoxygenation: Pre-oxygenation with 100% oxygen Intubation Type: IV induction and Cricoid Pressure applied Ventilation: Mask ventilation without difficulty Laryngoscope Size: Mac and 4 Grade View: Grade III Tube type: Oral Tube size: 7.5 mm Number of attempts: 1 Airway Equipment and Method: Stylet Placement Confirmation: positive ETCO2,  ETT inserted through vocal cords under direct vision and breath sounds checked- equal and bilateral Secured at: 21 cm Tube secured with: Tape Dental Injury: Teeth and Oropharynx as per pre-operative assessment  Difficulty Due To: Difficulty was unanticipated Future Recommendations: Recommend- induction with short-acting agent, and alternative techniques readily available Comments: Cords visualized with cricoid pressure/head lift.  Anterior larynx, slight tmj pop on left when opening mouth.

## 2014-07-02 NOTE — Discharge Instructions (Signed)
ABDOMINAL SURGERY: POST OP INSTRUCTIONS  1. DIET: Follow a light bland diet the first 24 hours after arrival home, such as soup, liquids, crackers, etc.  Be sure to include lots of fluids daily.  Avoid fast food or heavy meals as your are more likely to get nauseated.  Eat a low fat the next few days after surgery.   2. Take your usually prescribed home medications unless otherwise directed. 3. PAIN CONTROL: a. Pain is best controlled by a usual combination of three different methods TOGETHER: i. Ice/Heat ii. Over the counter pain medication iii. Prescription pain medication b. Most patients will experience some swelling and bruising around the incisions.  Ice packs or heating pads (30-60 minutes up to 6 times a day) will help. Use ice for the first few days to help decrease swelling and bruising, then switch to heat to help relax tight/sore spots and speed recovery.  Some people prefer to use ice alone, heat alone, alternating between ice & heat.  Experiment to what works for you.  Swelling and bruising can take several weeks to resolve.   c. It is helpful to take an over-the-counter pain medication regularly for the first few weeks.  Choose one of the following that works best for you: i. Naproxen (Aleve, etc)  Two 228m tabs twice a day ii. Ibuprofen (Advil, etc) Three 2071mtabs four times a day (every meal & bedtime) iii. Acetaminophen (Tylenol, etc) 500-65061mour times a day (every meal & bedtime) d. A  prescription for pain medication (such as oxycodone, hydrocodone, etc) should be given to you upon discharge.  Take your pain medication as prescribed.  i. If you are having problems/concerns with the prescription medicine (does not control pain, nausea, vomiting, rash, itching, etc), please call us [phone] see if we need to switch you to a different pain medicine that will work better for you and/or control your side effect better. ii. If you need a refill on your pain medication,  please contact your pharmacy.  They will contact our office to request authorization. Prescriptions will not be filled after 5 pm or on week-ends. 4. Avoid getting constipated.  Between the surgery and the pain medications, it is common to experience some constipation.  Increasing fluid intake and taking a fiber supplement (such as Metamucil, Citrucel, FiberCon, MiraLax, etc) 1-2 times a day regularly will usually help prevent this problem from occurring.  A mild laxative (prune juice, Milk of Magnesia, MiraLax, etc) should be taken according to package directions if there are no bowel movements after 48 hours.   5. Watch out for diarrhea.  If you have many loose bowel movements, simplify your diet to bland foods & liquids for a few days.  Stop any stool softeners and decrease your fiber supplement.  Switching to mild anti-diarrheal medications (Kayopectate, Pepto Bismol) can help.  If this worsens or does not improve, please call us.[place]. Wash / shower every day.  You may shower over the incision / wound.  Avoid baths until the skin is fully healed.  Continue to shower over incision(s) after the dressing is off. 7. Remove your waterproof bandages 5 days after surgery.  You may leave the incision open to air.  Remove any wicks or ribbons in your wound.  If you have an open wound, please see wound care instructions. You may replace a dressing/Band-Aid to cover the incision for comfort if you wish. 8. ACTIVITIES as tolerated:   a. You may resume regular (light)  daily activities beginning the next day--such as daily self-care, walking, climbing stairs--gradually increasing activities as tolerated.  If you can walk 30 minutes without difficulty, it is safe to try more intense activity such as jogging, treadmill, bicycling, low-impact aerobics, swimming, etc. b. Save the most intensive and strenuous activity for last such as sit-ups, heavy lifting, contact sports, etc  Refrain from any heavy lifting or straining  until you are off narcotics for pain control.   c. DO NOT PUSH THROUGH PAIN.  Let pain be your guide: If it hurts to do something, don't do it.  Pain is your body warning you to avoid that activity for another week until the pain goes down. d. You may drive when you are no longer taking prescription pain medication, you can comfortably wear a seatbelt, and you can safely maneuver your car and apply brakes. e. Dennis Bast may have sexual intercourse when it is comfortable.  9. FOLLOW UP in our office a. Please call [facility] at [phone] to set up an appointment to see your surgeon in the office for a follow-up appointment approximately 1-2 weeks after your surgery. b. Make sure that you call for this appointment the day you arrive home to insure a convenient appointment time. 10. IF YOU HAVE DISABILITY OR FAMILY LEAVE FORMS, BRING THEM TO THE OFFICE FOR PROCESSING.  DO NOT GIVE THEM TO YOUR DOCTOR.   WHEN TO CALL us [phone]: 1. Poor pain control 2. Reactions / problems with new medications (rash/itching, nausea, etc)  3. Fever over 101.5 F (38.5 C) 4. Inability to urinate 5. Nausea and/or vomiting 6. Worsening swelling or bruising 7. Continued bleeding from incision. 8. Increased pain, redness, or drainage from the incision  The clinic staff is available to answer your questions during regular business hours (8:30am-5pm).  Please dont hesitate to call and ask to speak to one of our nurses for clinical concerns.   A surgeon from [facility] is always on call at the hospitals   If you have a medical emergency, go to the nearest emergency room or call 911.    [facility], [address], [place], [place], [place]  [address] ? MAIN: [phone] ? TOLL FREE: [phone] ? FAX [phone] [email or web address]  GETTING TO GOOD BOWEL HEALTH. Irregular bowel habits such as constipation and diarrhea can lead to many problems over time.   Having one soft bowel movement a day is the most important way to prevent further problems.  The anorectal canal is designed to handle stretching and feces to safely manage our ability to get rid of solid waste (feces, poop, stool) out of our body.  BUT, hard constipated stools can act like ripping concrete bricks and diarrhea can be a burning fire to this very sensitive area of our body, causing inflamed hemorrhoids, anal fissures, increasing risk is perirectal abscesses, abdominal pain/bloating, an making irritable bowel worse.     The goal: ONE SOFT BOWEL MOVEMENT A DAY!  To have soft, regular bowel movements:   Drink at least 8 tall glasses of water a day.    Take plenty of fiber.  Fiber is the undigested part of plant food that passes into the colon, acting s natures broom to encourage bowel motility and movement.  Fiber can absorb and hold large amounts of water. This results in a larger, bulkier stool, which is soft and easier to pass. Work gradually over several weeks up to 6 servings a day of fiber (  25g a day even more if needed) in the form of: o Vegetables -- Root (potatoes, carrots, turnips), leafy green (lettuce, salad greens, celery, spinach), or cooked high residue (cabbage, broccoli, etc) o Fruit -- Fresh (unpeeled skin & pulp), Dried (prunes, apricots, cherries, etc ),  or stewed ( applesauce)  o Whole grain breads, pasta, etc (whole wheat)  o Bran cereals   Bulking Agents -- This type of water-retaining fiber generally is easily obtained each day by one of the following:  o Psyllium bran -- The psyllium plant is remarkable because its ground seeds can retain so much water. This product is available as Metamucil, Konsyl, Effersyllium, Per Diem Fiber, or the less expensive generic preparation in drug and health food stores. Although labeled a laxative, it really is not a laxative.  o Methylcellulose -- This is another fiber derived from wood which also retains water. It is available as  Citrucel. o Polyethylene Glycol - and artificial fiber commonly called Miralax or Glycolax.  It is helpful for people with gassy or bloated feelings with regular fiber o Flax Seed - a less gassy fiber than psyllium  No reading or other relaxing activity while on the toilet. If bowel movements take longer than 5 minutes, you are too constipated  AVOID CONSTIPATION.  High fiber and water intake usually takes care of this.  Sometimes a laxative is needed to stimulate more frequent bowel movements, but   Laxatives are not a good long-term solution as it can wear the colon out. o Osmotics (Milk of Magnesia, Fleets phosphosoda, Magnesium citrate, MiraLax, GoLytely) are safer than  o Stimulants (Senokot, Castor Oil, Dulcolax, Ex Lax)    o Do not take laxatives for more than 7days in a row.   IF SEVERELY CONSTIPATED, try a Bowel Retraining Program: o Do not use laxatives.  o Eat a diet high in roughage, such as bran cereals and leafy vegetables.  o Drink six (6) ounces of prune or apricot juice each morning.  o Eat two (2) large servings of stewed fruit each day.  o Take one (1) heaping tablespoon of a psyllium-based bulking agent twice a day. Use sugar-free sweetener when possible to avoid excessive calories.  o Eat a normal breakfast.  o Set aside 15 minutes after breakfast to sit on the toilet, but do not strain to have a bowel movement.  o If you do not have a bowel movement by the third day, use an enema and repeat the above steps.   Controlling diarrhea o Switch to liquids and simpler foods for a few days to avoid stressing your intestines further. o Avoid dairy products (especially milk & ice cream) for a short time.  The intestines often can lose the ability to digest lactose when stressed. o Avoid foods that cause gassiness or bloating.  Typical foods include beans and other legumes, cabbage, broccoli, and dairy foods.  Every person has some sensitivity to other foods, so listen to our  body and avoid those foods that trigger problems for you. o Adding fiber (Citrucel, Metamucil, psyllium, Miralax) gradually can help thicken stools by absorbing excess fluid and retrain the intestines to act more normally.  Slowly increase the dose over a few weeks.  Too much fiber too soon can backfire and cause cramping & bloating. o Probiotics (such as active yogurt, Align, etc) may help repopulate the intestines and colon with normal bacteria and calm down a sensitive digestive tract.  Most studies show it to be of mild help,  though, and such products can be costly. o Medicines: - Bismuth subsalicylate (ex. Kayopectate, Pepto Bismol) every 30 minutes for up to 6 doses can help control diarrhea.  Avoid if pregnant. - Loperamide (Immodium) can slow down diarrhea.  Start with two tablets (4mg  total) first and then try one tablet every 6 hours.  Avoid if you are having fevers or severe pain.  If you are not better or start feeling worse, stop all medicines and call your doctor for advice o Call your doctor if you are getting worse or not better.  Sometimes further testing (cultures, endoscopy, X-ray studies, bloodwork, etc) may be needed to help diagnose and treat the cause of the diarrhea.  Managing Pain  Pain after surgery or related to activity is often due to strain/injury to muscle, tendon, nerves and/or incisions.  This pain is usually short-term and will improve in a few months.   Many people find it helpful to do the following things TOGETHER to help speed the process of healing and to get back to regular activity more quickly:  1. Avoid heavy physical activity a.  no lifting greater than 20 pounds b. Do not push through the pain.  Listen to your body and avoid positions and maneuvers than reproduce the pain c. Walking is okay as tolerated, but go slowly and stop when getting sore.  d. Remember: If it hurts to do it, then dont do it! 2. Take Anti-inflammatory medication  a. Take with  food/snack around the clock for 1-2 weeks i. This helps the muscle and nerve tissues become less irritable and calm down faster b. Choose ONE of the following over-the-counter medications: i. Naproxen 220mg  tabs (ex. Aleve) 1-2 pills twice a day  ii. Ibuprofen 200mg  tabs (ex. Advil, Motrin) 3-4 pills with every meal and just before bedtime iii. Acetaminophen 500mg  tabs (Tylenol) 1-2 pills with every meal and just before bedtime 3. Use a Heating pad or Ice/Cold Pack a. 4-6 times a day b. May use warm bath/hottub  or showers 4. Try Gentle Massage and/or Stretching  a. at the area of pain many times a day b. stop if you feel pain - do not overdo it  Try these steps together to help you body heal faster and avoid making things get worse.  Doing just one of these things may not be enough.    If you are not getting better after two weeks or are noticing you are getting worse, contact our office for further advice; we may need to re-evaluate you & see what other things we can do to help.  Diverticulosis Diverticulosis is the condition that develops when small pouches (diverticula) form in the wall of your colon. Your colon, or large intestine, is where water is absorbed and stool is formed. The pouches form when the inside layer of your colon pushes through weak spots in the outer layers of your colon. CAUSES  No one knows exactly what causes diverticulosis. RISK FACTORS  Being older than 10. Your risk for this condition increases with age. Diverticulosis is rare in people younger than 40 years. By age 44, almost everyone has it.  Eating a low-fiber diet.  Being frequently constipated.  Being overweight.  Not getting enough exercise.  Smoking.  Taking over-the-counter pain medicines, like aspirin and ibuprofen. SYMPTOMS  Most people with diverticulosis do not have symptoms. DIAGNOSIS  Because diverticulosis often has no symptoms, health care providers often discover the condition  during an exam for other colon problems. In  many cases, a health care provider will diagnose diverticulosis while using a flexible scope to examine the colon (colonoscopy). TREATMENT  If you have never developed an infection related to diverticulosis, you may not need treatment. If you have had an infection before, treatment may include:  Eating more fruits, vegetables, and grains.  Taking a fiber supplement.  Taking a live bacteria supplement (probiotic).  Taking medicine to relax your colon. HOME CARE INSTRUCTIONS   Drink at least 6-8 glasses of water each day to prevent constipation.  Try not to strain when you have a bowel movement.  Keep all follow-up appointments. If you have had an infection before:  Increase the fiber in your diet as directed by your health care provider or dietitian.  Take a dietary fiber supplement if your health care provider approves.  Only take medicines as directed by your health care provider. SEEK MEDICAL CARE IF:   You have abdominal pain.  You have bloating.  You have cramps.  You have not gone to the bathroom in 3 days. SEEK IMMEDIATE MEDICAL CARE IF:   Your pain gets worse.  Yourbloating becomes very bad.  You have a fever or chills, and your symptoms suddenly get worse.  You begin vomiting.  You have bowel movements that are bloody or black. MAKE SURE YOU:  Understand these instructions.  Will watch your condition.  Will get help right away if you are not doing well or get worse. Document Released: 04/06/2004 Document Revised: 07/15/2013 Document Reviewed: 06/04/2013 [facility] Patient Information 2015 [place], [place]. This information is not intended to replace advice given to you by your health care provider. Make sure you discuss any questions you have with your health care provider.

## 2014-07-02 NOTE — Transfer of Care (Signed)
Immediate Anesthesia Transfer of Care Note  Patient: Jessica Chan  Procedure(s) Performed: Procedure(s): ROBOTIC SIGMOID COLECTOMY WITH SPLENIC FLEXURE MOBILIZATION (Left) PROCTOSCOPY (N/A)  Patient Location: PACU  Anesthesia Type:General  Level of Consciousness: awake, oriented, patient cooperative, lethargic and responds to stimulation  Airway & Oxygen Therapy: Patient Spontanous Breathing and Patient connected to face mask oxygen  Post-op Assessment: Report given to PACU RN, Post -op Vital signs reviewed and stable and Patient moving all extremities  Post vital signs: Reviewed and stable  Complications: No apparent anesthesia complications

## 2014-07-02 NOTE — Op Note (Addendum)
07/02/2014  4:27 PM  PATIENT:  Jessica Chan  53 y.o. female  Patient Care Team: Burnis Medin, MD as PCP - General Gatha Mayer, MD as Attending Physician (Gastroenterology)  PRE-OPERATIVE DIAGNOSIS:  Recurrent Descending & Sigmoid Diverticulitis  POST-OPERATIVE DIAGNOSIS:  Recurrent Descending & Sigmoid Diverticulitis  PROCEDURE:  Procedure(s): ROBOTIC LEFT COLECTOMY  ROBOTIC SPLENIC FLEXURE MOBILIZATION PROCTOSCOPY  SURGEON:  Surgeon(s): Michael Boston, MD  ASSISTANT: Stark Klein,, MD   ANESTHESIA:   local and general  EBL:  Total I/O In: 45 [I.V.:1900] Out: 250 [Urine:200; Blood:50]  Delay start of Pharmacological VTE agent (>24hrs) due to surgical blood loss or risk of bleeding:  no  DRAINS: none   SPECIMEN:  Source of Specimen:  Left colon  DISPOSITION OF SPECIMEN:  PATHOLOGY  COUNTS:  YES  PLAN OF CARE: Admit to inpatient   PATIENT DISPOSITION:  PACU - hemodynamically stable.  INDICATION:    Patient with recurrent episodes of diverticulitis in the descending and sigmoid colon.  More frequent attacks.  More dependent on antibiotics.  More open to considering surgical therapy.   I recommended segmental resection:  The anatomy & physiology of the digestive tract was discussed.  The pathophysiology was discussed.  Natural history risks without surgery was discussed.   I worked to give an overview of the disease and the frequent need to have multispecialty involvement.  I feel the risks of no intervention will lead to serious problems that outweigh the operative risks; therefore, I recommended a partial colectomy to remove the pathology.  Laparoscopic & open techniques were discussed.   Risks such as bleeding, infection, abscess, leak, reoperation, possible ostomy, hernia, heart attack, death, and other risks were discussed.  I noted a good likelihood this will help address the problem.   Goals of post-operative recovery were discussed as well.  We will work to  minimize complications.  Educational materials on the pathology had been given in the office.  Questions were answered.    The patient expressed understanding & wished to proceed with surgery.  OR FINDINGS:   Patient had moderately thickened sigmoid and distal descending colon.  Some mild adhesions not severe.  No stricturing.  No abscess.  Left colon mesentery rather foreshortened which limited mobility.  No obvious metastatic disease on visceral parietal peritoneum or liver.  The anastomosis rests 12 cm from the anal verge by rigid proctoscopy.  DESCRIPTION:   Informed consent was confirmed.  The patient underwent general anaesthesia without difficulty.  The patient was positioned appropriately.  VTE prevention in place.  The patient's abdomen was clipped, prepped, & draped in a sterile fashion.  Surgical timeout confirmed our plan.  The patient was positioned in reverse Trendelenburg.  Abdominal entry was gained using Varess technique with a trach hook on the anterior abdominal wall fascia in the left upper abdomen.  Entry was clean.  I induced carbon dioxide insufflation.  Camera inspection revealed no injury.  Extra ports were carefully placed under direct laparoscopic visualization.  I reflected the greater omentum and the upper abdomen the small bowel in the upper abdomen.  The patient was carefully positioned.  The Intuitive daVinci robot was carefully docked with camera & instruments carefully placed.  The colon mesentery was rather thin and fragile.  The patient had some inflammation and descending/sigmoid colon adhesions to the left anterior abdominal wall.  Mildly redundant sigmoid colon but shortened descending colon mesentery.  I scored the base of peritoneum of the medial side of the mesentery  of the left colon from the ligament of Treitz to the peritoneal reflection of the mid rectum.   I elevated the sigmoid mesentery and entered into the retro-mesenteric plane. We were able to  identify the left ureter and gonadal vessels. We kept those posterior within the retroperitoneum and elevated the left colon mesentery off that.  I did isolate the inferior mesenteric artery (IMA) pedicle but did not ligate it yet.  In retracting I did get brisk bleeding from the proximal inferior mesenteric artery.  I was able to control it with a robotic clamp.  We did suction and irrigation.  Confirmed the ureter was away.  I ligated it carefully.    I continued distally and got into the avascular plane posterior to the mesorectum. This allowed me to help mobilize the rectum as well by freeing the mesorectum off the sacrum.  I mobilized the peritoneal coverings towards the peritoneal reflection on both the right and left sides of the rectum.  I stayed away from the right and left ureters.  I kept the lateral vascular pedicles to the rectum intact.   The rectum was soft.  I skeletonized at the proximal mesorectum and transected at the proximal rectum using a robotic 45 mm stapler.   I isolated the inferior mesenteric & left colic vein off of the ligament of Treitz just cephalad to that as well.  After confirming the left ureter was out of the way, I went ahead and ligated the left colon vein near its takeoff at the ligament of Treitz.  We ensured hemostasis. I skeletonized the mesorectum at the junction at the proximal rectum for the distal point of resection.  I mobilized the left colon in a lateral to medial fashion off the line of Toldt up towards the splenic flexure to ensure good mobilization of the remaining left colon to reach into the pelvis.  Unfortunately the left colon would not reach down.  Therefore to do more formal splenic flexure mobilization.  Mobilized the colon off the retroperitoneum including the kidney and inferior pancreatic edge.  Mobilized dense adhesions to the spleen as well.  I did take the distal greater omentum off the mid distal transverse colon and got into the lesser sac.  I  removed the greater omentum off the colon,  following it distally until we could free off further transverse colon and splenic flexure attachments to better mobilize it.  Eventually had to ligate the inferior mesenteric vein as well to help straighten out the mesentery.  With that I did excellent mobilization with splenic flexure reading duct reaching down into the pelvis.   I chose a region at the splenic flexure junction that was soft and easily reached down to the rectal stump.  I transected the mesentery of the colon radially to preserve remaining colon blood supply.  I placed a wound protector through a small Pfannenstiel incision in the suprapubic region, taking care to avoid bladder injury.  I was able to eviscerate the rectosigmoid and descending colon out the wound.   I clamped the colon proximal to this area using a soft bowel clamp. I transected at the descending/sigmoid junction with a scalpel. I got healthy bleeding mucosa.  We sent the rectosigmoid colon specimen off to go to pathology.  We sized the colon orifice.  I chose a 33 EEA anvil stapler system. I placed the anvil to the open end of the proximal remaining colon and closed around it using a 0 Prolene pursestring.  We did  copious irrigation with crystalloid solution.  Hemostasis was good.  The distal end of the remaining colon easily reached down to the rectal stump.    Dr Barry Dienes scrubbed down and did gentle anal dilation and advanced the EEA stapler up the rectal stump. The spike was brought out at the provimal end of the rectal stump under direct laparoscopic visualization.  I attached the anvil of the proximal colon the spike of the stapler. Anvil was tightened down and held clamped for 60 seconds. The EEA stapler was fired and held clamped for 30 seconds. The stapler was released & removed. We noted 2 excellent anastomotic rings. Blue stitch is in the proximal ring.  Dr Barry Dienes did rigid proctoscopy noted the anastomosis was at 12 cm  from the anal verge consistent with the proximal rectum.  We did a final irrigation of antibiotic solution (900 mg clindamycin/240 mg gentamicin in a liter of crystalloid) & held that for the pelvic air leak test .  The rectum was insufflated the rectum while clamping the colon proximal to that anastomosis.  There was a negative air leak test. There was no tension of mesentery or bowel at the anastomosis.   Tissues looked viable.  Ureters & bowel uninjured.  The anastomosis looked healthy.  Endoluminal gas was evacuated.  Ports & wound protector removed.  We changed gloves.  We aspirated the antibiotic irrigation.  Hemostasis was good.  Sterile unused instruments were used from this point out per colon SSI prevention protocol.  I closed the 33mm port sites using Monocryl stitch and sterile dressing.  I closed the Pfannenstiel wound using a 0 Vicryl vertical peritoneal closure and a #1 PDS transverse anterior rectal fascial closure. I closed the skin with some interrupted Monocryl stitches. I placed antibiotic-soaked wicks into the closure at the corners & centrally x4 between those areas. I placed a sterile dressing.    Patient is being extubated go to recovery room. I had discussed postop care with the patient in detail the office & in the holding area. Instructions are written.  I'm about to locate family and discuss it with them as well.  [name], M.D., F.A.C.S. Gastrointestinal and Minimally Invasive Surgery [facility] [address] [place], [place] [address] [phone] Main / Paging

## 2014-07-02 NOTE — H&P (View-Only) (Signed)
Jessica Chan 06/10/2014 11:05 AM Location: [facility] Patient #: [id number] DOB: 03-Jan-1961 Married / Language: Undefined / Race: Undefined Female History of Present Illness Jessica Hector MD; 06/10/2014 1:52 PM) Patient words: possible colon resection.  The patient is a 53 year old female who presents with diverticulitis. Pleasant woman sent by Dr. Silvano Chan with gastroenterology for recurrent diverticulitis.  Pleasant and active woman. No prior abdominal surgery. Has had episodes for diverticulitis for the past decade. Usually controlled with antibiotic regimen of Cipro and Flagyl. Had colonoscopy 4 years ago noting left-sided diverticulitis/osis. Especially and distal descending/proximal sigmoid colon. She was sent to me to consider resection. She was considering this in 2011. Unfortunately her husband was diagnosed with cancer so she deferred. The attacks seem to be less often. Usually 1 or 2 a year. However, she's had repeated attacks this year. They're becoming more intense and more sudden. It concerns her. She also notes her bowels have had more urgency and constipation concerning as well.   She was more open to considering surgery after her last discussion with her gastroenterologist. She knows a friend of a friend whom had a robotic colectomy by me and had good things to say. She was very interested in a minimally invasive especially robotic approach. Other Problems Jessica Leventhal, RN, BSN; 06/10/2014 11:05 AM) Anxiety Disorder Diverticulosis Gastric Ulcer Gastroesophageal Reflux Disease Kidney Jessica Chan  Past Surgical History Jessica Leventhal, RN, BSN; 06/10/2014 11:05 AM) Tonsillectomy  Diagnostic Studies History (Jessica Leventhal, RN, BSN; 06/10/2014 11:05 AM) Colonoscopy 1-5 years ago Mammogram within last year Pap Smear 1-5 years ago  Allergies Jessica Leventhal, RN, BSN; 06/10/2014 11:06 AM) Amoxicillin-Pot Clavulanate  *PENICILLINS* Meperidine HCl *ANALGESICS - OPIOID*  Medication History Jessica Leventhal, RN, BSN; 06/10/2014 11:08 AM) ALPRAZolam (0.25MG  Tablet, Oral as needed) Active. MetroNIDAZOLE (500MG  Tablet, Oral daily) Active. BuPROPion HCl ER (XL) (150MG  Tablet ER 24HR, Oral daily) Active. Multiple Vitamin (Oral daily) Active. Medications Reconciled  Social History Multimedia programmer, RN, BSN; 06/10/2014 11:05 AM) Alcohol use Moderate alcohol use. Caffeine use Coffee. No drug use Tobacco use Never smoker.  Family History (Jessica Leventhal, RN, BSN; 06/10/2014 11:05 AM) Alcohol Abuse Father. Heart Disease Father. Hypertension Father. Kidney Disease Mother. Respiratory Condition Father, Mother. Thyroid problems Mother.  Pregnancy / Birth History (Jessica Leventhal, RN, BSN; 06/10/2014 11:05 AM) Age at menarche 89 years. Age of menopause 1-55 Gravida 2 Irregular periods Maternal age 34-30 Para 2     Review of Systems Occupational hygienist, BSN; 06/10/2014 11:05 AM) General Not Present- Appetite Loss, Chills, Fatigue, Fever, Night Sweats, Weight Gain and Weight Loss. Skin Not Present- Change in Wart/Mole, Dryness, Hives, Jaundice, New Lesions, Non-Healing Wounds, Rash and Ulcer. HEENT Present- Seasonal Allergies and Wears glasses/contact lenses. Not Present- Earache, Hearing Loss, Hoarseness, Nose Bleed, Oral Ulcers, Ringing in the Ears, Sinus Pain, Sore Throat, Visual Disturbances and Yellow Eyes. Cardiovascular Not Present- Chest Pain, Difficulty Breathing Lying Down, Leg Cramps, Palpitations, Rapid Heart Rate, Shortness of Breath and Swelling of Extremities. Gastrointestinal Present- Abdominal Pain, Change in Bowel Habits and Constipation. Not Present- Bloating, Bloody Stool, Chronic diarrhea, Difficulty Swallowing, Excessive gas, Gets full quickly at meals, Hemorrhoids, Indigestion, Nausea, Rectal Pain and Vomiting. Female Genitourinary Present- Nocturia. Not  Present- Frequency, Painful Urination, Pelvic Pain and Urgency. Musculoskeletal Not Present- Back Pain, Joint Pain, Joint Stiffness, Muscle Pain, Muscle Weakness and Swelling of Extremities. Neurological Not Present- Decreased Memory, Fainting, Headaches, Numbness, Seizures, Tingling, Tremor, Trouble walking and Weakness. Psychiatric Present- Change in Sleep Pattern. Not  Present- Anxiety, Bipolar, Depression, Fearful and Frequent crying. Endocrine Present- Hair Changes and Hot flashes. Not Present- Cold Intolerance, Excessive Hunger, Heat Intolerance and New Diabetes. Hematology Not Present- Easy Bruising, Excessive bleeding, Gland problems, HIV and Persistent Infections.  Vitals Occupational hygienist, BSN; 06/10/2014 11:11 AM) 06/10/2014 11:08 AM Weight: 111.4 lb Height: 65in Body Surface Area: 1.52 m Body Mass Index: 18.54 kg/m Temp.: 98.44F(Oral)  Pulse: 68 (Regular)  Resp.: 18 (Unlabored)  BP: 90/60 (Sitting, Left Arm, Standard)     Physical Exam Jessica Hector MD; 06/10/2014 11:56 AM)  General Mental Status-Alert. General Appearance-Not in acute distress, Not Sickly. Orientation-Oriented X3. Hydration-Well hydrated. Voice-Normal.  Integumentary Global Assessment Upon inspection and palpation of skin surfaces of the - Axillae: non-tender, no inflammation or ulceration, no drainage. and Distribution of scalp and body hair is normal. General Characteristics Temperature - normal warmth is noted.  Head and Neck Head-normocephalic, atraumatic with no lesions or palpable masses. Face Global Assessment - atraumatic, no absence of expression. Neck Global Assessment - no abnormal movements, no bruit auscultated on the right, no bruit auscultated on the left, no decreased range of motion, non-tender. Trachea-midline. Thyroid Gland Characteristics - non-tender.  Eye Eyeball - Left-Extraocular movements intact, No Nystagmus. Eyeball -  Right-Extraocular movements intact, No Nystagmus. Cornea - Left-No Hazy. Cornea - Right-No Hazy. Sclera/Conjunctiva - Left-No scleral icterus, No Discharge. Sclera/Conjunctiva - Right-No scleral icterus, No Discharge. Pupil - Left-Direct reaction to light normal. Pupil - Right-Direct reaction to light normal.  ENMT Ears Pinna - Left - no drainage observed, no generalized tenderness observed. Right - no drainage observed, no generalized tenderness observed. Nose and Sinuses External Inspection of the Nose - no destructive lesion observed. Inspection of the nares - Left - quiet respiration. Right - quiet respiration. Mouth and Throat Lips - Upper Lip - no fissures observed, no pallor noted. Lower Lip - no fissures observed, no pallor noted. Nasopharynx - no discharge present. Oral Cavity/Oropharynx - Tongue - no dryness observed. Oral Mucosa - no cyanosis observed. Hypopharynx - no evidence of airway distress observed.  Chest and Lung Exam Inspection Movements - Normal and Symmetrical. Accessory muscles - No use of accessory muscles in breathing. Palpation Palpation of the chest reveals - Non-tender. Auscultation Breath sounds - Normal and Clear.  Cardiovascular Auscultation Rhythm - Regular. Murmurs & Other Heart Sounds - Auscultation of the heart reveals - No Murmurs and No Systolic Clicks.  Abdomen Inspection Inspection of the abdomen reveals - No Visible peristalsis and No Abnormal pulsations. Umbilicus - No Bleeding, No Urine drainage. Palpation/Percussion Palpation and Percussion of the abdomen reveal - Soft, Non Tender, No Rebound tenderness, No Rigidity (guarding) and No Cutaneous hyperesthesia. Note: Mild soreness that left flank/left lower quadrant. Not severe. No guarding. Abdomen otherwise soft and flat.   Female Genitourinary Sexual Maturity Tanner 5 - Adult hair pattern. Note: No vaginal bleeding nor discharge   Peripheral Vascular Upper  Extremity Inspection - Left - No Cyanotic nailbeds, Not Ischemic. Right - No Cyanotic nailbeds, Not Ischemic.  Neurologic Neurologic evaluation reveals -normal attention span and ability to concentrate, able to name objects and repeat phrases. Appropriate fund of knowledge , normal sensation and normal coordination. Mental Status Affect - not angry, not paranoid. Cranial Nerves-Normal Bilaterally. Gait-Normal.  Neuropsychiatric Mental status exam performed with findings of-able to articulate well with normal speech/language, rate, volume and coherence, thought content normal with ability to perform basic computations and apply abstract reasoning and no evidence of hallucinations, delusions, obsessions or homicidal/suicidal  ideation.  Musculoskeletal Global Assessment Spine, Ribs and Pelvis - no instability, subluxation or laxity. Right Upper Extremity - no instability, subluxation or laxity.  Lymphatic Head & Neck  General Head & Neck Lymphatics: Bilateral - Description - No Localized lymphadenopathy. Axillary  General Axillary Region: Bilateral - Description - No Localized lymphadenopathy. Femoral & Inguinal  Generalized Femoral & Inguinal Lymphatics: Left - Description - No Localized lymphadenopathy. Right - Description - No Localized lymphadenopathy.    Assessment & Plan Jessica Hector MD; 06/10/2014 1:28 PM)  DIVERTICULITIS OF LARGE INTESTINE WITHOUT PERFORATION OR ABSCESS WITHOUT BLEEDING (562.11  K57.32) Impression: Patient with chronic diverticulitis. Recurrent attacks happening every year. At least 15 attacks. Now becoming more intense with less notice. Still resolves with oral antibiotics. Some change in bowel habits makes me concerned for chronic thickening/stricture as well.  I again think she would benefit from surgery, especially since I saw her 4 years ago. She is interested in proceeding now that they are happening more intensely.  Current  Plans Schedule for Surgery Pt Education - [facility] Bowel Prep Pt Education - [facility] Good Bowel Health (Keniel Ralston) Pt Education - [facility] Abdominal Surgery (Sybrina Laning) Started Neomycin Sulfate 500MG , 2 (two) Tablet SEE NOTE, #6, 06/10/2014, No Refill. Local Order: TAKE TWO TABLETS AT 2 PM, 3 PM, AND 10 PM THE DAY PRIOR TO SURGERY Started Flagyl 500MG , 2 (two) Tablet SEE NOTE, #6, 06/10/2014, No Refill. Local Order: Take at 2pm, 3pm, and 10pm the day prior to your colon operation [facility] Consent - Colectomy (Tinita Brooker): discussed with patient and provided information.

## 2014-07-02 NOTE — Interval H&P Note (Signed)
History and Physical Interval Note:  07/02/2014 11:51 AM  Jessica Chan  has presented today for surgery, with the diagnosis of Recurrent Descending Sigmoid Diverticulitis  The various methods of treatment have been discussed with the patient and family. After consideration of risks, benefits and other options for treatment, the patient has consented to  Procedure(s): ROBOTIC LEFT COLECTOMY (Left) PROCTOSCOPY (N/A) as a surgical intervention .  The patient's history has been reviewed, patient examined, no change in status, stable for surgery.  I have reviewed the patient's chart and labs.  Questions were answered to the patient's satisfaction.    The anatomy & physiology of the digestive tract was discussed.  The pathophysiology of the colon was discussed.  Natural history risks without surgery was discussed.   I feel the risks of no intervention will lead to serious problems that outweigh the operative risks; therefore, I recommended a partial colectomy to remove the pathology.  Minimally invasive (Robotic/Laparoscopic) & open techniques were discussed.   Risks such as bleeding, infection, abscess, leak, reoperation, possible ostomy, hernia, heart attack, stroke, death, and other risks were discussed.  I noted a good likelihood this will help address the problem.   Goals of post-operative recovery were discussed as well.   Need for adequate nutrition, daily bowel regimen and healthy physical activity, to optimize recovery was noted as well. We will work to minimize complications.  Educational materials were available as well.  Questions were answered.  The patient expresses understanding & wishes to proceed with surgery.   Jordain Radin C.

## 2014-07-03 ENCOUNTER — Encounter (HOSPITAL_COMMUNITY): Payer: Self-pay | Admitting: Surgery

## 2014-07-03 LAB — CBC
HEMATOCRIT: 30.5 % — AB (ref 36.0–46.0)
HEMOGLOBIN: 10.2 g/dL — AB (ref 12.0–15.0)
MCH: 30.9 pg (ref 26.0–34.0)
MCHC: 33.4 g/dL (ref 30.0–36.0)
MCV: 92.4 fL (ref 78.0–100.0)
Platelets: 146 10*3/uL — ABNORMAL LOW (ref 150–400)
RBC: 3.3 MIL/uL — ABNORMAL LOW (ref 3.87–5.11)
RDW: 12.3 % (ref 11.5–15.5)
WBC: 9.7 10*3/uL (ref 4.0–10.5)

## 2014-07-03 LAB — BASIC METABOLIC PANEL
Anion gap: 11 (ref 5–15)
BUN: 12 mg/dL (ref 6–23)
CO2: 24 meq/L (ref 19–32)
Calcium: 8.2 mg/dL — ABNORMAL LOW (ref 8.4–10.5)
Chloride: 102 mEq/L (ref 96–112)
Creatinine, Ser: 0.78 mg/dL (ref 0.50–1.10)
GFR calc Af Amer: >90 mL/min (ref >90)
GFR calc non Af Amer: >90 mL/min (ref >90)
GLUCOSE: 132 mg/dL — AB (ref 70–99)
Potassium: 4.1 mEq/L (ref 3.7–5.3)
SODIUM: 137 meq/L (ref 137–147)

## 2014-07-03 LAB — MAGNESIUM: Magnesium: 1.5 mg/dL (ref 1.5–2.5)

## 2014-07-03 MED ORDER — ACETAMINOPHEN 160 MG/5ML PO SOLN
650.0000 mg | Freq: Four times a day (QID) | ORAL | Status: DC
  Administered 2014-07-03: 650 mg via ORAL
  Filled 2014-07-03 (×3): qty 20.3

## 2014-07-03 MED ORDER — METOPROLOL TARTRATE 1 MG/ML IV SOLN
5.0000 mg | Freq: Four times a day (QID) | INTRAVENOUS | Status: DC | PRN
  Filled 2014-07-03: qty 5

## 2014-07-03 NOTE — Plan of Care (Signed)
Problem: Phase III Progression Outcomes Goal: Demonstrates TCDB, IS independently Outcome: Completed/Met Date Met:  07/03/14     

## 2014-07-03 NOTE — Progress Notes (Signed)
[facility]  [address] [place], [place] [address] Phone: [phone] FAX: [phone]    Jessica Chan [id number] 05/19/1961  CARE TEAM:  PCP: Lottie Dawson, MD  Outpatient Care Team: Patient Care Team: Burnis Medin, MD as PCP - General Jessica Mayer, MD as Attending Physician (Gastroenterology)  Inpatient Treatment Team: Treatment Team: Attending Provider: Michael Boston, MD; Technician: Leda Quail, NT; Technician: Delight Stare, NT; Registered Nurse: Emeterio Reeve, RN; Registered Nurse: Arminda Resides, RN   Subjective:  Walked in hallway - nauseated Dysphagia with tylenol pills tol liq fine  Objective:  Vital signs:  Filed Vitals:   07/02/14 2100 07/03/14 0010 07/03/14 0216 07/03/14 0634  BP: _0 102/58  Pulse: 94  98 96  Temp: 97.7 F (36.5 C)  98.2 F (36.8 C) 98.5 F (36.9 C)  TempSrc: Oral  Oral Oral  Resp: _1 Height:      Weight:      SpO2: 100%  100% 100%    Last BM Date: 07/01/14  Intake/Output   Yesterday:  12/10 0701 - 12/11 0700 In: 2883.8 [P.O.:200; I.V.:2633.8; IV Piggyback:50] Out: 1550 [Urine:1500; Blood:50] This shift:     Bowel function:  Flatus: n  BM: n  Drain: n/a  Physical Exam:  General: Pt awake/alert/oriented x4 in no acute distress Eyes: PERRL, normal EOM.  Sclera clear.  No icterus Neuro: CN II-XII intact w/o focal sensory/motor deficits. Lymph: No head/neck/groin lymphadenopathy Psych:  No delerium/psychosis/paranoia HENT: Normocephalic, Mucus membranes moist.  No thrush Neck: Supple, No tracheal deviation Chest: No chest wall pain w good excursion CV:  Pulses intact.  Regular rhythm MS: Normal AROM mjr joints.  No obvious deformity Abdomen: Soft.  Nondistended.  Dressings w old blood.  Mildly tender at incisions only.  No evidence of peritonitis.  No incarcerated hernias. Ext:  SCDs BLE.  No mjr edema.  No  cyanosis Skin: No petechiae / purpura   Problem List:   Active Problems:   HEMANGIOMA, HEPATIC   Allergic rhinitis   GERD   Diverticulitis of colon - recurrent   Situational anxiety   Diverticulitis of sigmoid colon   Assessment  Jessica Chan  53 y.o. female  1 Day Post-Op  Procedure(s): ROBOTIC SIGMOID COLECTOMY WITH SPLENIC FLEXURE MOBILIZATION PROCTOSCOPY  Stable  Plan:  -switch to liq tylenol -f/u path -anxiolysis -GERD control -VTE prophylaxis- SCDs, etc -mobilize as tolerated to help recovery  I updated the patient's status to the patient.  Recommendations were made.  Questions were answered.  The patient expressed understanding & appreciation.   [name], M.D., F.A.C.S. Gastrointestinal and Minimally Invasive Surgery [facility] [address] [place], [place] [address] [phone] Main / Paging   07/03/2014   Results:   Labs: Results for orders placed or performed during the hospital encounter of 07/02/14 (from the past 48 hour(s))  Basic metabolic panel     Status: Abnormal   Collection Time: 07/03/14  5:10 AM  Result Value Ref Range   Sodium 137 137 - 147 mEq/L   Potassium 4.1 3.7 - 5.3 mEq/L   Chloride 102 96 - 112 mEq/L   CO2 24 19 - 32 mEq/L   Glucose, Bld 132 (H) 70 - 99 mg/dL   BUN 12 6 - 23 mg/dL   Creatinine, Ser 0.78 0.50 - 1.10 mg/dL   Calcium 8.2 (L) 8.4 - 10.5 mg/dL  GFR calc non Af Amer >90 >90 mL/min   GFR calc Af Amer >90 >90 mL/min    Comment: (NOTE) The eGFR has been calculated using the CKD EPI equation. This calculation has not been validated in all clinical situations. eGFR's persistently <90 mL/min signify possible Chronic Kidney Disease.    Anion gap 11 5 - 15  CBC     Status: Abnormal   Collection Time: 07/03/14  5:10 AM  Result Value Ref Range   WBC 9.7 4.0 - 10.5 K/uL   RBC 3.30 (L) 3.87 - 5.11 MIL/uL   Hemoglobin 10.2 (L) 12.0 - 15.0 g/dL   HCT 30.5 (L) 36.0 -  46.0 %   MCV 92.4 78.0 - 100.0 fL   MCH 30.9 26.0 - 34.0 pg   MCHC 33.4 30.0 - 36.0 g/dL   RDW 12.3 11.5 - 15.5 %   Platelets 146 (L) 150 - 400 K/uL  Magnesium     Status: None   Collection Time: 07/03/14  5:10 AM  Result Value Ref Range   Magnesium 1.5 1.5 - 2.5 mg/dL    Imaging / Studies: No results found.  Medications / Allergies: per chart  Antibiotics: Anti-infectives    Start     Dose/Rate Route Frequency Ordered Stop   07/02/14 2200  clindamycin (CLEOCIN) IVPB 900 mg     900 mg100 mL/hr over 30 Minutes Intravenous 3 times per day 07/02/14 1812 07/02/14 2238   07/02/14 1541  clindamycin (CLEOCIN) 900 mg, gentamicin (GARAMYCIN) 240 mg in sodium chloride 0.9 % 1,000 mL for intraperitoneal lavage  Status:  Discontinued       As needed 07/02/14 1541 07/02/14 1634   07/02/14 1100  clindamycin (CLEOCIN) 900 mg, gentamicin (GARAMYCIN) 240 mg in sodium chloride 0.9 % 1,000 mL for intraperitoneal lavage  Status:  Discontinued    Comments:  Pharmacy may adjust dosing strength, schedule, rate of infusion, etc as needed to optimize therapy    Intraperitoneal To Surgery 07/02/14 1000 07/02/14 1801   07/02/14 1030  clindamycin (CLEOCIN) IVPB 900 mg     900 mg100 mL/hr over 30 Minutes Intravenous 60 min pre-op 07/02/14 1001 07/02/14 1300   07/02/14 1001  gentamicin (GARAMYCIN) 260 mg in dextrose 5 % 100 mL IVPB     5 mg/kg  51.3 kg106.5 mL/hr over 60 Minutes Intravenous 60 min pre-op 07/02/14 1001 07/02/14 1245       Note: Portions of this report may have been transcribed using voice recognition software. Every effort was made to ensure accuracy; however, inadvertent computerized transcription errors may be present.   Any transcriptional errors that result from this process are unintentional.

## 2014-07-04 LAB — CBC
HCT: 29.6 % — ABNORMAL LOW (ref 36.0–46.0)
Hemoglobin: 9.7 g/dL — ABNORMAL LOW (ref 12.0–15.0)
MCH: 31.3 pg (ref 26.0–34.0)
MCHC: 32.8 g/dL (ref 30.0–36.0)
MCV: 95.5 fL (ref 78.0–100.0)
PLATELETS: 136 10*3/uL — AB (ref 150–400)
RBC: 3.1 MIL/uL — ABNORMAL LOW (ref 3.87–5.11)
RDW: 12.7 % (ref 11.5–15.5)
WBC: 5.9 10*3/uL (ref 4.0–10.5)

## 2014-07-04 MED ORDER — ACETAMINOPHEN 325 MG PO TABS
650.0000 mg | ORAL_TABLET | Freq: Four times a day (QID) | ORAL | Status: DC
  Administered 2014-07-04 – 2014-07-06 (×6): 650 mg via ORAL
  Filled 2014-07-04 (×13): qty 2

## 2014-07-04 NOTE — Progress Notes (Signed)
2 Days Post-Op  Subjective: Patient reports two large bloody bowel movements last night.  Abdomen softer Still with considerable soreness with any movement Tolerating full liquid diet Honeycomb dressing changed because of significant oozing. Objective: Vital signs in last 24 hours: Temp:  [98.2 F (36.8 C)-99.2 F (37.3 C)] 98.4 F (36.9 C) (12/12 0600) Pulse Rate:  [61-87] 85 (12/12 0600) Resp:  [16-18] 16 (12/12 0600) BP: (90-109)/(51-58) 90/58 mmHg (12/12 0600) SpO2:  [98 %-100 %] 100 % (12/12 0600) Last BM Date: 07/01/14  Intake/Output from previous day: 12/11 0701 - 12/12 0700 In: 360 [P.O.:360] Out: 3450 [Urine:3450] Intake/Output this shift:    General appearance: alert, cooperative and no distress Resp: clear to auscultation bilaterally Cardio: regular rate and rhythm, S1, S2 normal, no murmur, click, rub or gallop GI: soft, incisional tenderness; active bowel sounds Bruising around port sites and lower abdominal incision  Lab Results:   Recent Labs  07/03/14 0510  WBC 9.7  HGB 10.2*  HCT 30.5*  PLT 146*   BMET  Recent Labs  07/03/14 0510  NA 137  K 4.1  CL 102  CO2 24  GLUCOSE 132*  BUN 12  CREATININE 0.78  CALCIUM 8.2*   PT/INR No results for input(s): LABPROT, INR in the last 72 hours. ABG No results for input(s): PHART, HCO3 in the last 72 hours.  Invalid input(s): PCO2, PO2  Studies/Results: No results found.  Anti-infectives: Anti-infectives    Start     Dose/Rate Route Frequency Ordered Stop   07/02/14 2200  clindamycin (CLEOCIN) IVPB 900 mg     900 mg100 mL/hr over 30 Minutes Intravenous 3 times per day 07/02/14 1812 07/02/14 2238   07/02/14 1541  clindamycin (CLEOCIN) 900 mg, gentamicin (GARAMYCIN) 240 mg in sodium chloride 0.9 % 1,000 mL for intraperitoneal lavage  Status:  Discontinued       As needed 07/02/14 1541 07/02/14 1634   07/02/14 1100  clindamycin (CLEOCIN) 900 mg, gentamicin (GARAMYCIN) 240 mg in sodium chloride 0.9  % 1,000 mL for intraperitoneal lavage  Status:  Discontinued    Comments:  Pharmacy may adjust dosing strength, schedule, rate of infusion, etc as needed to optimize therapy    Intraperitoneal To Surgery 07/02/14 1000 07/02/14 1801   07/02/14 1030  clindamycin (CLEOCIN) IVPB 900 mg     900 mg100 mL/hr over 30 Minutes Intravenous 60 min pre-op 07/02/14 1001 07/02/14 1300   07/02/14 1001  gentamicin (GARAMYCIN) 260 mg in dextrose 5 % 100 mL IVPB     5 mg/kg  51.3 kg106.5 mL/hr over 60 Minutes Intravenous 60 min pre-op 07/02/14 1001 07/02/14 1245      Assessment/Plan: s/p Procedure(s): ROBOTIC SIGMOID COLECTOMY WITH SPLENIC FLEXURE MOBILIZATION (Left) PROCTOSCOPY (N/A) Advance diet Recheck Hgb  Stop subcutaneous heparin because of hematochezia and oozing from incision. Encourage ambulation. PO pain meds   LOS: 2 days    Jessica Redel K. 07/04/2014

## 2014-07-05 NOTE — Progress Notes (Signed)
4 wicks removed from incision per order. Incision clean, dry, and intact. Sterile dry gauze and paper tape applied to incision. Pt tolerated well. Will continue to monitor.  Jessica Chan

## 2014-07-05 NOTE — Progress Notes (Signed)
3 Days Post-Op  Subjective: Patient has had some more bloody bowel movements - off Heparin Has some bruising on right flank - ??positioning during surgery  Objective: Vital signs in last 24 hours: Temp:  [98.2 F (36.8 C)-98.8 F (37.1 C)] 98.2 F (36.8 C) (12/12 2200) Pulse Rate:  [78-83] 83 (12/12 2200) Resp:  [16] 16 (12/12 2200) BP: (94-100)/(45-56) 94/56 mmHg (12/12 2200) SpO2:  [95 %-100 %] 100 % (12/12 2200) Weight:  [111 lb 4.8 oz (50.485 kg)] 111 lb 4.8 oz (50.485 kg) (12/12 1400) Last BM Date: 07/04/14  Intake/Output from previous day: 12/12 0701 - 12/13 0700 In: -  Out: 2050 [Urine:2050] Intake/Output this shift:    General appearance: alert, cooperative and no distress Resp: clear to auscultation bilaterally Cardio: regular rate and rhythm, S1, S2 normal, no murmur, click, rub or gallop GI: soft, incisional tenderness; active bowel sounds Incisions c/d/i - honeycomb dressing over Pfannenstiel incision  Lab Results:   Recent Labs  07/03/14 0510 07/04/14 0856  WBC 9.7 5.9  HGB 10.2* 9.7*  HCT 30.5* 29.6*  PLT 146* 136*   BMET  Recent Labs  07/03/14 0510  NA 137  K 4.1  CL 102  CO2 24  GLUCOSE 132*  BUN 12  CREATININE 0.78  CALCIUM 8.2*   PT/INR No results for input(s): LABPROT, INR in the last 72 hours. ABG No results for input(s): PHART, HCO3 in the last 72 hours.  Invalid input(s): PCO2, PO2  Studies/Results: No results found.  Anti-infectives: Anti-infectives    Start     Dose/Rate Route Frequency Ordered Stop   07/02/14 2200  clindamycin (CLEOCIN) IVPB 900 mg     900 mg100 mL/hr over 30 Minutes Intravenous 3 times per day 07/02/14 1812 07/02/14 2238   07/02/14 1541  clindamycin (CLEOCIN) 900 mg, gentamicin (GARAMYCIN) 240 mg in sodium chloride 0.9 % 1,000 mL for intraperitoneal lavage  Status:  Discontinued       As needed 07/02/14 1541 07/02/14 1634   07/02/14 1100  clindamycin (CLEOCIN) 900 mg, gentamicin (GARAMYCIN) 240 mg in  sodium chloride 0.9 % 1,000 mL for intraperitoneal lavage  Status:  Discontinued    Comments:  Pharmacy may adjust dosing strength, schedule, rate of infusion, etc as needed to optimize therapy    Intraperitoneal To Surgery 07/02/14 1000 07/02/14 1801   07/02/14 1030  clindamycin (CLEOCIN) IVPB 900 mg     900 mg100 mL/hr over 30 Minutes Intravenous 60 min pre-op 07/02/14 1001 07/02/14 1300   07/02/14 1001  gentamicin (GARAMYCIN) 260 mg in dextrose 5 % 100 mL IVPB     5 mg/kg  51.3 kg106.5 mL/hr over 60 Minutes Intravenous 60 min pre-op 07/02/14 1001 07/02/14 1245      Assessment/Plan: s/p Procedure(s): ROBOTIC SIGMOID COLECTOMY WITH SPLENIC FLEXURE MOBILIZATION (Left) PROCTOSCOPY (N/A) Repeat CBC in AM  Continue to hold anticoagulation for now Possible discharge tomorrow.  LOS: 3 days    Nicholette Dolson K. 07/05/2014

## 2014-07-06 LAB — BASIC METABOLIC PANEL
Anion gap: 8 (ref 5–15)
BUN: 5 mg/dL — AB (ref 6–23)
CHLORIDE: 104 meq/L (ref 96–112)
CO2: 31 mEq/L (ref 19–32)
Calcium: 8.9 mg/dL (ref 8.4–10.5)
Creatinine, Ser: 0.74 mg/dL (ref 0.50–1.10)
GFR calc Af Amer: >90 mL/min (ref >90)
GFR calc non Af Amer: >90 mL/min (ref >90)
Glucose, Bld: 92 mg/dL (ref 70–99)
POTASSIUM: 3.6 meq/L — AB (ref 3.7–5.3)
SODIUM: 143 meq/L (ref 137–147)

## 2014-07-06 LAB — CBC
HEMATOCRIT: 29 % — AB (ref 36.0–46.0)
HEMOGLOBIN: 9.6 g/dL — AB (ref 12.0–15.0)
MCH: 31.1 pg (ref 26.0–34.0)
MCHC: 33.1 g/dL (ref 30.0–36.0)
MCV: 93.9 fL (ref 78.0–100.0)
Platelets: 150 10*3/uL (ref 150–400)
RBC: 3.09 MIL/uL — ABNORMAL LOW (ref 3.87–5.11)
RDW: 12.3 % (ref 11.5–15.5)
WBC: 5.2 10*3/uL (ref 4.0–10.5)

## 2014-07-06 MED ORDER — TRAMADOL HCL 50 MG PO TABS
50.0000 mg | ORAL_TABLET | Freq: Four times a day (QID) | ORAL | Status: DC | PRN
  Administered 2014-07-06: 100 mg via ORAL
  Filled 2014-07-06: qty 2

## 2014-07-06 MED ORDER — TRAMADOL HCL 50 MG PO TABS: 50.0000 mg | ORAL_TABLET | Freq: Four times a day (QID) | ORAL | Status: DC | PRN

## 2014-07-06 NOTE — Discharge Summary (Signed)
Physician Discharge Summary  Patient ID: Jessica Chan MRN: [id number] DOB/AGE: [birth year] 53 y.o.  Admit date: 07/02/2014 Discharge date: 07/06/2014  Patient Care Team: Burnis Medin, MD as PCP - General Gatha Mayer, MD as Attending Physician (Gastroenterology)  Admission Diagnoses: Active Problems:   HEMANGIOMA, HEPATIC   Allergic rhinitis   GERD   Diverticulitis of colon - recurrent   Situational anxiety   Diverticulitis of sigmoid colon   Discharge Diagnoses:  Active Problems:   HEMANGIOMA, HEPATIC   Allergic rhinitis   GERD   Diverticulitis of colon - recurrent   Situational anxiety   Diverticulitis of sigmoid colon   POST-OPERATIVE DIAGNOSIS:  Recurrent Descending Sigmoid Diverticulitis  SURGERY:  Procedure(s): ROBOTIC SIGMOID COLECTOMY WITH SPLENIC FLEXURE MOBILIZATION PROCTOSCOPY  SURGEON:  Surgeon(s): Michael Boston, MD  Consults: None  Hospital Course:   The patient underwent the surgery above.  Postoperatively, the patient gradually mobilized and advanced to a solid diet.  Pain and other symptoms were treated aggressively.   Did not tolerate oxycodone due to nausea - switched.  Bruising on right flank but Hgb stable.    By the time of discharge, the patient was walking well the hallways, eating food, having flatus.  Pain was well-controlled on an oral medications.  Based on meeting discharge criteria and continuing to recover, I felt it was safe for the patient to be discharged from the hospital to further recover with close followup. Postoperative recommendations were discussed in detail.  They are written as well.   Significant Diagnostic Studies:  Results for orders placed or performed during the hospital encounter of 07/02/14 (from the past 72 hour(s))  CBC     Status: Abnormal   Collection Time: 07/04/14  8:56 AM  Result Value Ref Range   WBC 5.9 4.0 - 10.5 K/uL   RBC 3.10 (L) 3.87 - 5.11 MIL/uL   Hemoglobin 9.7 (L) 12.0 - 15.0 g/dL   HCT  29.6 (L) 36.0 - 46.0 %   MCV 95.5 78.0 - 100.0 fL   MCH 31.3 26.0 - 34.0 pg   MCHC 32.8 30.0 - 36.0 g/dL   RDW 12.7 11.5 - 15.5 %   Platelets 136 (L) 150 - 400 K/uL  CBC     Status: Abnormal   Collection Time: 07/06/14  4:24 AM  Result Value Ref Range   WBC 5.2 4.0 - 10.5 K/uL   RBC 3.09 (L) 3.87 - 5.11 MIL/uL   Hemoglobin 9.6 (L) 12.0 - 15.0 g/dL   HCT 29.0 (L) 36.0 - 46.0 %   MCV 93.9 78.0 - 100.0 fL   MCH 31.1 26.0 - 34.0 pg   MCHC 33.1 30.0 - 36.0 g/dL   RDW 12.3 11.5 - 15.5 %   Platelets 150 150 - 400 K/uL  Basic metabolic panel     Status: Abnormal   Collection Time: 07/06/14  4:24 AM  Result Value Ref Range   Sodium 143 137 - 147 mEq/L   Potassium 3.6 (L) 3.7 - 5.3 mEq/L   Chloride 104 96 - 112 mEq/L   CO2 31 19 - 32 mEq/L   Glucose, Bld 92 70 - 99 mg/dL   BUN 5 (L) 6 - 23 mg/dL   Creatinine, Ser 0.74 0.50 - 1.10 mg/dL   Calcium 8.9 8.4 - 10.5 mg/dL   GFR calc non Af Amer >90 >90 mL/min   GFR calc Af Amer >90 >90 mL/min    Comment: (NOTE) The eGFR has been calculated using the CKD  EPI equation. This calculation has not been validated in all clinical situations. eGFR's persistently <90 mL/min signify possible Chronic Kidney Disease.    Anion gap 8 5 - 15    No results found.  Discharge Exam: Blood pressure 100/79, pulse 80, temperature 98.4 F (36.9 C), temperature source Oral, resp. rate 18, height 5' 5" (1.651 m), weight 106 lb 12.8 oz (48.444 kg), last menstrual period 07/25/2011, SpO2 98 %.  General: Pt awake/alert/oriented x4 in no major acute distress Eyes: PERRL, normal EOM. Sclera nonicteric Neuro: CN II-XII intact w/o focal sensory/motor deficits. Lymph: No head/neck/groin lymphadenopathy Psych:  No delerium/psychosis/paranoia.  Tearful/anxious but consolable HENT: Normocephalic, Mucus membranes moist.  No thrush Neck: Supple, No tracheal deviation Chest: No pain.  Good respiratory excursion. CV:  Pulses intact.  Regular rhythm MS: Normal AROM mjr  joints.  No obvious deformity Abdomen: Soft, Nondistended.  Nontender.  No incarcerated hernias.  Resolving large right flank/gluteal ecchymosis.   Ext:  SCDs BLE.  No significant edema.  No cyanosis Skin: No petechiae / purpura  Discharged Condition: good   Past Medical History  Diagnosis Date  . Allergic rhinitis   . GERD (gastroesophageal reflux disease)   . Hemangioma of liver     incidental finding-large, right and left lobes of liver   . Anxiety disorder   . Depression   . IBS (irritable bowel syndrome)   . Diverticulosis   . Hiatal hernia   . Sinusitis, acute maxillary 07/29/2012    right  hx of sinus surgery  fu if needed recurrecne    . BLEPHARITIS 05/27/2007    Qualifier: Diagnosis of  By: Hulan Saas, CMA (AAMA), [place]a Skye   . Diverticulitis   . Diverticulitis of colon - recurrent 10/02/2013  . ESOPHAGEAL STRICTURE 11/15/2009    Qualifier: Diagnosis of  By: Trellis Paganini PA-c, Amy S   . PONV (postoperative nausea and vomiting)     SEVERE NAUSEA & VOMITING    Past Surgical History  Procedure Laterality Date  . Nasal sinus surgery    . Ulnar nerve right arm    . Tonsillectomy    . Adnoidectomy    . Endoscopy with dialation    . Colonoscopy    . Upper gastrointestinal endoscopy    . Proctoscopy N/A 07/02/2014    Procedure: PROCTOSCOPY;  Surgeon: Michael Boston, MD;  Location: [facility];  Service: General;  Laterality: N/A;    History   Social History  . Marital Status: Married    Spouse Name: N/A    Number of Children: 2  . Years of Education: N/A   Occupational History  . Caregiver    Social History Main Topics  . Smoking status: Never Smoker   . Smokeless tobacco: Never Used  . Alcohol Use: Yes     Comment: Social Drinker--SEVERAL DRINKS A WEEK  . Drug Use: No  . Sexual Activity: Not on file   Other Topics Concern  . Not on file   Social History Narrative   Married HHof 4 one sone autistic   Husband had prostate cancer in last few years   Some exercise   Runs    No tobacco    Family History  Problem Relation Age of Onset  . Lung cancer Mother   . Heart disease Father   . Autism Son   . Colon cancer Maternal Uncle   . Breast cancer Maternal Grandmother     Current Facility-Administered Medications  Medication Dose Route Frequency Provider Last Rate Last Dose  .  acetaminophen (TYLENOL) tablet 650 mg  650 mg Oral QID Michael Boston, MD   650 mg at 07/05/14 1325  . ALPRAZolam Duanne Moron) tablet 0.25-0.5 mg  0.25-0.5 mg Oral TID PRN Michael Boston, MD      . alum & mag hydroxide-simeth (MAALOX/MYLANTA) 200-200-20 MG/5ML suspension 30 mL  30 mL Oral Q6H PRN Michael Boston, MD      . azithromycin (AZASITE) 1 % ophthalmic solution 1 drop  1 drop Both Eyes BID Michael Boston, MD   1 drop at 07/05/14 2126  . buPROPion (WELLBUTRIN XL) 24 hr tablet 150 mg  150 mg Oral Daily Michael Boston, MD   150 mg at 07/05/14 1100  . diphenhydrAMINE (BENADRYL) 12.5 MG/5ML elixir 12.5 mg  12.5 mg Oral Q6H PRN Michael Boston, MD       Or  . diphenhydrAMINE (BENADRYL) injection 12.5 mg  12.5 mg Intravenous Q6H PRN Michael Boston, MD      . HYDROmorphone (DILAUDID) injection 0.5-2 mg  0.5-2 mg Intravenous Q1H PRN Michael Boston, MD   0.5 mg at 07/03/14 2256  . ketotifen (ZADITOR) 0.025 % ophthalmic solution 1 drop  1 drop Both Eyes BID Michael Boston, MD   1 drop at 07/03/14 0947  . lip balm (CARMEX) ointment 1 application  1 application Topical BID Michael Boston, MD   1 application at 24/82/50 1100  . magic mouthwash  15 mL Oral QID PRN Michael Boston, MD      . menthol-cetylpyridinium (CEPACOL) lozenge 3 mg  1 lozenge Oral PRN Michael Boston, MD      . metoprolol (LOPRESSOR) injection 5 mg  5 mg Intravenous Q6H PRN Michael Boston, MD      . multivitamin with minerals tablet 1 tablet  1 tablet Oral Daily Michael Boston, MD   1 tablet at 07/05/14 1100  . ondansetron (ZOFRAN) tablet 4 mg  4 mg Oral Q6H PRN Michael Boston, MD       Or  . ondansetron [facility]) injection 4 mg  4 mg Intravenous Q6H  PRN Michael Boston, MD   4 mg at 07/05/14 1401  . phenol (CHLORASEPTIC) mouth spray 2 spray  2 spray Mouth/Throat PRN Michael Boston, MD      . promethazine (PHENERGAN) injection 6.25-12.5 mg  6.25-12.5 mg Intravenous Q4H PRN Michael Boston, MD      . saccharomyces boulardii (FLORASTOR) capsule 250 mg  250 mg Oral BID Michael Boston, MD   250 mg at 07/05/14 1100  . traMADol (ULTRAM) tablet 50-100 mg  50-100 mg Oral Q6H PRN Michael Boston, MD      . zolpidem [facility]) tablet 5 mg  5 mg Oral QHS PRN Michael Boston, MD   5 mg at 07/05/14 2124     Allergies  Allergen Reactions  . Amoxicillin-Pot Clavulanate     REACTION: Nausea  . Meperidine Hcl     Red patches all over body    Disposition:   Discharge Instructions    Call MD for:  extreme fatigue    Complete by:  As directed      Call MD for:  extreme fatigue    Complete by:  As directed      Call MD for:  hives    Complete by:  As directed      Call MD for:  hives    Complete by:  As directed      Call MD for:  persistant nausea and vomiting    Complete by:  As directed  Call MD for:  persistant nausea and vomiting    Complete by:  As directed      Call MD for:  redness, tenderness, or signs of infection (pain, swelling, redness, odor or green/yellow discharge around incision site)    Complete by:  As directed      Call MD for:  redness, tenderness, or signs of infection (pain, swelling, redness, odor or green/yellow discharge around incision site)    Complete by:  As directed      Call MD for:  severe uncontrolled pain    Complete by:  As directed      Call MD for:  severe uncontrolled pain    Complete by:  As directed      Call MD for:    Complete by:  As directed   Temperature > 101.43F     Call MD for:    Complete by:  As directed   Temperature > 101.43F     Diet - low sodium heart healthy    Complete by:  As directed      Discharge instructions    Complete by:  As directed   Please see discharge instruction sheets.  Also refer  to handout given an office.  Please call our office if you have any questions or concerns [phone]     Discharge instructions    Complete by:  As directed   Please see discharge instruction sheets.  Also refer to handout given an office.  Please call our office if you have any questions or concerns [phone]     Discharge wound care:    Complete by:  As directed   If you have closed incisions, shower and bathe over these incisions with soap and water every day.  Remove all surgical dressings on postoperative day #3.  You do not need to replace dressings over the closed incisions unless you feel more comfortable with a Band-Aid covering it.   If you have an open wound that requires packing, please see wound care instructions.  In general, remove all dressings, wash wound with soap and water and then replace with saline moistened gauze.  Do the dressing change at least every day.  Please call our office [phone] if you have further questions.     Discharge wound care:    Complete by:  As directed   If you have closed incisions, shower and bathe over these incisions with soap and water every day.  Remove all surgical dressings on postoperative day #3.  You do not need to replace dressings over the closed incisions unless you feel more comfortable with a Band-Aid covering it.   If you have an open wound that requires packing, please see wound care instructions.  In general, remove all dressings, wash wound with soap and water and then replace with saline moistened gauze.  Do the dressing change at least every day.  Please call our office [phone] if you have further questions.     Driving Restrictions    Complete by:  As directed   No driving until off narcotics and can safely swerve away without pain during an emergency     Driving Restrictions    Complete by:  As directed   No driving until off narcotics and can safely swerve away without pain during an emergency     Increase  activity slowly    Complete by:  As directed   Walk an hour a day.  Use 20-30 minute walks.  When you can walk 30 minutes without difficulty, increase to low impact/moderate activities such as biking, jogging, swimming, sexual activity..  Eventually can increase to unrestricted activity when not feeling pain.  If you feel pain: STOP!Marland Kitchen   Let pain protect you from overdoing it.  Use ice/heat/over-the-counter pain medications to help minimize his soreness.  Use pain prescriptions as needed to remain active.  It is better to take extra pain medications and be more active than to stay bedridden to avoid all pain medications.     Increase activity slowly    Complete by:  As directed   Walk an hour a day.  Use 20-30 minute walks.  When you can walk 30 minutes without difficulty, increase to low impact/moderate activities such as biking, jogging, swimming, sexual activity..  Eventually can increase to unrestricted activity when not feeling pain.  If you feel pain: STOP!Marland Kitchen   Let pain protect you from overdoing it.  Use ice/heat/over-the-counter pain medications to help minimize his soreness.  Use pain prescriptions as needed to remain active.  It is better to take extra pain medications and be more active than to stay bedridden to avoid all pain medications.     Lifting restrictions    Complete by:  As directed   Avoid heavy lifting initially.  Do not push through pain.  You have no specific weight limit.  Coughing and sneezing or four more stressful to your incision than any lifting you will do. Pain will protect you from injury.  Therefore, avoid intense activity until off all narcotic pain medications.  Coughing and sneezing or four more stressful to your incision than any lifting he will do.     Lifting restrictions    Complete by:  As directed   Avoid heavy lifting initially.  Do not push through pain.  You have no specific weight limit.  Coughing and sneezing or four more stressful to your incision than any  lifting you will do. Pain will protect you from injury.  Therefore, avoid intense activity until off all narcotic pain medications.  Coughing and sneezing or four more stressful to your incision than any lifting he will do.     May shower / Bathe    Complete by:  As directed      May shower / Bathe    Complete by:  As directed      May walk up steps    Complete by:  As directed      May walk up steps    Complete by:  As directed      Sexual Activity Restrictions    Complete by:  As directed   Sexual activity as tolerated.  Do not push through pain.  Pain will protect you from injury.     Sexual Activity Restrictions    Complete by:  As directed   Sexual activity as tolerated.  Do not push through pain.  Pain will protect you from injury.     Walk with assistance    Complete by:  As directed   Walk over an hour a day.  May use a walker/cane/companion to help with balance and stamina.     Walk with assistance    Complete by:  As directed   Walk over an hour a day.  May use a walker/cane/companion to help with balance and stamina.            Medication List    TAKE these medications  ALPRAZolam 0.25 MG tablet  Commonly known as:  XANAX  take 1 tablet by mouth if needed     azithromycin 1 % ophthalmic solution  Commonly known as:  AZASITE  Place 1 drop into both eyes 2 (two) times daily.     buPROPion 150 MG 24 hr tablet  Commonly known as:  WELLBUTRIN XL  Take 1 tablet (150 mg total) by mouth every morning.     ibuprofen 200 MG tablet  Commonly known as:  ADVIL,MOTRIN  Take 400 mg by mouth every 6 (six) hours as needed for headache or mild pain.     multivitamin tablet  Take 1 tablet by mouth every morning.     THERA TEARS ALLERGY OP  Apply 2 drops to eye 2 (two) times daily.     traMADol 50 MG tablet  Commonly known as:  ULTRAM  Take 1-2 tablets (50-100 mg total) by mouth every 6 (six) hours as needed for moderate pain or severe pain.            Follow-up Information    Follow up with Jisele Price C., MD In 2 weeks.   Specialty:  General Surgery   Contact information:   [address] [place] [address] [phone]        Signed: Morton Peters, M.D., F.A.C.S. Gastrointestinal and Minimally Invasive Surgery [facility] [address] [place], [place] [address] [phone] Main / Paging   07/06/2014, 7:17 AM

## 2015-02-16 ENCOUNTER — Telehealth: Payer: Self-pay | Admitting: Family Medicine

## 2015-02-16 ENCOUNTER — Other Ambulatory Visit: Payer: Self-pay | Admitting: Internal Medicine

## 2015-02-16 ENCOUNTER — Other Ambulatory Visit: Payer: Self-pay | Admitting: Family Medicine

## 2015-02-16 ENCOUNTER — Encounter: Payer: Self-pay | Admitting: Internal Medicine

## 2015-02-16 DIAGNOSIS — Z0000 Encounter for general adult medical examination without abnormal findings: Secondary | ICD-10-CM

## 2015-02-16 NOTE — Telephone Encounter (Signed)
Sent to the pharmacy by e-scribe. 

## 2015-02-16 NOTE — Telephone Encounter (Signed)
This patient is due for cpx and lab work around the end of Oct.  Please help her to make these appointments.  I have placed the lab orders.  Thanks!

## 2015-02-16 NOTE — Telephone Encounter (Signed)
Pt would like to know the name of ob-gyn dr Regis Bill referred her to years  ago. I  Do not see a name of md in her chart. Pt also would like refill on wellbutrin [facility]

## 2015-02-16 NOTE — Telephone Encounter (Signed)
Pt has been sch

## 2015-02-17 NOTE — Telephone Encounter (Signed)
Tried reaching the pt X 2.  Bad connection.  Pt unable to hear me.  Will try again at a later time.

## 2015-02-17 NOTE — Telephone Encounter (Signed)
o to refill wellbutrin until her next visit .  dont know about gyne n? Dr Manya Silvas is on the referral  Notes    If some one has time to review the record  To get her information th ok with me .

## 2015-02-18 NOTE — Telephone Encounter (Signed)
Spoke to the pt.  Informed her that I sent the medication to the pharmacy.  Advised that Dr. Regis Bill recommends Dr. Uvaldo Rising as an ob/gyn.  Pt will try to call and get her own appointment.  If help is needed then she will call back.

## 2015-03-01 LAB — HM MAMMOGRAPHY

## 2015-03-02 ENCOUNTER — Encounter: Payer: Self-pay | Admitting: Family Medicine

## 2015-03-24 ENCOUNTER — Other Ambulatory Visit (HOSPITAL_COMMUNITY)
Admission: RE | Admit: 2015-03-24 | Discharge: 2015-03-24 | Disposition: A | Discharge status: Home / Self Care | Payer: BLUE CROSS/BLUE SHIELD | Source: Ambulatory Visit | Attending: Gynecology | Admitting: Gynecology

## 2015-03-24 ENCOUNTER — Ambulatory Visit (INDEPENDENT_AMBULATORY_CARE_PROVIDER_SITE_OTHER): Payer: BLUE CROSS/BLUE SHIELD | Admitting: Gynecology

## 2015-03-24 ENCOUNTER — Encounter: Payer: Self-pay | Admitting: Gynecology

## 2015-03-24 VITALS — BP 118/70 | Ht 65.0 in | Wt 110.0 lb

## 2015-03-24 DIAGNOSIS — Z01419 Encounter for gynecological examination (general) (routine) without abnormal findings: Secondary | ICD-10-CM | POA: Diagnosis not present

## 2015-03-24 DIAGNOSIS — R87810 Cervical high risk human papillomavirus (HPV) DNA test positive: Secondary | ICD-10-CM | POA: Insufficient documentation

## 2015-03-24 DIAGNOSIS — Z1151 Encounter for screening for human papillomavirus (HPV): Secondary | ICD-10-CM | POA: Diagnosis present

## 2015-03-24 DIAGNOSIS — G4700 Insomnia, unspecified: Secondary | ICD-10-CM

## 2015-03-24 DIAGNOSIS — N951 Menopausal and female climacteric states: Secondary | ICD-10-CM | POA: Diagnosis not present

## 2015-03-24 MED ORDER — ZOLPIDEM TARTRATE 10 MG PO TABS: 10.0000 mg | ORAL_TABLET | Freq: Every evening | ORAL | Status: DC | PRN

## 2015-03-24 NOTE — Patient Instructions (Signed)
[place] [employer] Web Page [place] as needed Relizen order on [employer]  Menopause Menopause is the normal time of life when menstrual periods stop completely. Menopause is complete when you have missed 12 consecutive menstrual periods. It usually occurs between the ages of 6 years and 18 years. Very rarely does a woman develop menopause before the age of 41 years. At menopause, your ovaries stop producing the female hormones estrogen and progesterone. This can cause undesirable symptoms and also affect your health. Sometimes the symptoms may occur 4-5 years before the menopause begins. There is no relationship between menopause and:  Oral contraceptives.  Number of children you had.  Race.  The age your menstrual periods started (menarche). Heavy smokers and very thin women may develop menopause earlier in life. CAUSES  The ovaries stop producing the female hormones estrogen and progesterone.  Other causes include:  Surgery to remove both ovaries.  The ovaries stop functioning for no known reason.  Tumors of the pituitary gland in the brain.  Medical disease that affects the ovaries and hormone production.  Radiation treatment to the abdomen or pelvis.  Chemotherapy that affects the ovaries. SYMPTOMS   Hot flashes.  Night sweats.  Decrease in sex drive.  Vaginal dryness and thinning of the vagina causing painful intercourse.  Dryness of the skin and developing wrinkles.  Headaches.  Tiredness.  Irritability.  Memory problems.  Weight gain.  Bladder infections.  Hair growth of the face and chest.  Infertility. More serious symptoms include:  Loss of bone (osteoporosis) causing breaks (fractures).  Depression.  Hardening and narrowing of the arteries (atherosclerosis) causing heart attacks and strokes. DIAGNOSIS   When the menstrual periods have stopped for 12 straight months.  Physical exam.  Hormone studies of  the blood. TREATMENT  There are many treatment choices and nearly as many questions about them. The decisions to treat or not to treat menopausal changes is an individual choice made with your health care provider. Your health care provider can discuss the treatments with you. Together, you can decide which treatment will work best for you. Your treatment choices may include:   Hormone therapy (estrogen and progesterone).  Non-hormonal medicines.  Treating the individual symptoms with medicine (for example antidepressants for depression).  Herbal medicines that may help specific symptoms.  Counseling by a psychiatrist or psychologist.  Group therapy.  Lifestyle changes including:  Eating healthy.  Regular exercise.  Limiting caffeine and alcohol.  Stress management and meditation.  No treatment. HOME CARE INSTRUCTIONS   Take the medicine your health care provider gives you as directed.  Get plenty of sleep and rest.  Exercise regularly.  Eat a diet that contains calcium (good for the bones) and soy products (acts like estrogen hormone).  Avoid alcoholic beverages.  Do not smoke.  If you have hot flashes, dress in layers.  Take supplements, calcium, and vitamin D to strengthen bones.  You can use over-the-counter lubricants or moisturizers for vaginal dryness.  Group therapy is sometimes very helpful.  Acupuncture may be helpful in some cases. SEEK MEDICAL CARE IF:   You are not sure you are in menopause.  You are having menopausal symptoms and need advice and treatment.  You are still having menstrual periods after age 47 years.  You have pain with intercourse.  Menopause is complete (no menstrual period for 12 months) and you develop vaginal bleeding.  You need a referral to a specialist (gynecologist, psychiatrist, or psychologist) for treatment. SEEK  IMMEDIATE MEDICAL CARE IF:   You have severe depression.  You have excessive vaginal  bleeding.  You fell and think you have a broken bone.  You have pain when you urinate.  You develop leg or chest pain.  You have a fast pounding heart beat (palpitations).  You have severe headaches.  You develop vision problems.  You feel a lump in your breast.  You have abdominal pain or severe indigestion. Document Released: 09/30/2003 Document Revised: 03/12/2013 Document Reviewed: 02/06/2013 [facility] Patient Information 2015 [place], [place]. This information is not intended to replace advice given to you by your health care provider. Make sure you discuss any questions you have with your health care provider.  Bone Densitometry Bone densitometry is a special X-ray that measures your bone density and can be used to help predict your risk of bone fractures. This test is used to determine bone mineral content and density to diagnose osteoporosis. Osteoporosis is the loss of bone that may cause the bone to become weak. Osteoporosis commonly occurs in women entering menopause. However, it may be found in men and in people with other diseases. PREPARATION FOR TEST No preparation necessary. WHO SHOULD BE TESTED?  All women older than 27.  Postmenopausal women (50 to 64) with risk factors for osteoporosis.  People with a previous fracture caused by normal activities.  People with a small body frame (less than 127 poundsor a body mass index [BMI] of less than 21).  People who have a parent with a hip fracture or history of osteoporosis.  People who smoke.  People who have rheumatoid arthritis.  Anyone who engages in excessive alcohol use (more than 3 drinks most days).  Women who experience early menopause. WHEN SHOULD YOU BE RETESTED? Current guidelines suggest that you should wait at least 2 years before doing a bone density test again if your first test was normal.Recent studies indicated that women with normal bone density may be able to wait a few years before needing  to repeat a bone density test. You should discuss this with your caregiver.  NORMAL FINDINGS   Normal: less than standard deviation below normal (greater than -1).  Osteopenia: 1 to 2.5 standard deviations below normal (-1 to -2.5).  Osteoporosis: greater than 2.5 standard deviations below normal (less than -2.5). Test results are reported as a "T score" and a "Z score."The T score is a number that compares your bone density with the bone density of healthy, young women.The Z score is a number that compares your bone density with the scores of women who are the same age, gender, and race.  Ranges for normal findings may vary among different laboratories and hospitals. You should always check with your doctor after having lab work or other tests done to discuss the meaning of your test results and whether your values are considered within normal limits. MEANING OF TEST  Your caregiver will go over the test results with you and discuss the importance and meaning of your results, as well as treatment options and the need for additional tests if necessary. OBTAINING THE TEST RESULTS It is your responsibility to obtain your test results. Ask the lab or department performing the test when and how you will get your results. Document Released: 08/01/2004 Document Revised: 10/02/2011 Document Reviewed: 08/24/2010 [facility] Patient Information 2015 [place], [place]. This information is not intended to replace advice given to you by your health care provider. Make sure you discuss any questions you have with your health care  provider.  

## 2015-03-24 NOTE — Progress Notes (Signed)
Jessica Chan [birth year] [id number]   History:    54 y.o.  for annual gyn exam who is new to the practice. She had been referred last year by her primary care physician Dr. Regis Bill but did not make the appointment until this year. Patient is been complaining of hot flashes, insomnia, swelling and some vaginal dryness. No true decreased libido. Her colonoscopy was in 2011 and then in 2015 December she had resection of her colon as a result of diverticulitis. She has done well from her surgery. Her PCP has been doing her blood work. Her prior OB/GYN had retire over 5 years ago she has not had a Pap smear since that time. She is not had a bone density study as of yet.  Past medical history,surgical history, family history and social history were all reviewed and documented in the EPIC chart.  Gynecologic History Patient's last menstrual period was 07/25/2011. Contraception: post menopausal status Last Pap: Over 5 years ago. Results were: normal Last mammogram: 2016. Results were: normal  Obstetric History OB History  Gravida Para Term Preterm AB SAB TAB Ectopic Multiple Living  2 2        2     # Outcome Date GA Lbr Len/2nd Weight Sex Delivery Anes PTL Lv  2 Para     M Vag-Spont     1 Para     M Vag-Spont          ROS: A ROS was performed and pertinent positives and negatives are included in the history.  GENERAL: No fevers or chills. HEENT: No change in vision, no earache, sore throat or sinus congestion. NECK: No pain or stiffness. CARDIOVASCULAR: No chest pain or pressure. No palpitations. PULMONARY: No shortness of breath, cough or wheeze. GASTROINTESTINAL: No abdominal pain, nausea, vomiting or diarrhea, melena or bright red blood per rectum. GENITOURINARY: No urinary frequency, urgency, hesitancy or dysuria. MUSCULOSKELETAL: No joint or muscle pain, no back pain, no recent trauma. DERMATOLOGIC: No rash, no itching, no lesions. ENDOCRINE: No polyuria, polydipsia, no heat or cold  intolerance. No recent change in weight. HEMATOLOGICAL: No anemia or easy bruising or bleeding. NEUROLOGIC: No headache, seizures, numbness, tingling or weakness. PSYCHIATRIC: No depression, no loss of interest in normal activity or change in sleep pattern.     Exam: chaperone present  BP 118/70 mmHg  Ht 5\' 5"  (1.651 m)  Wt 110 lb (49.896 kg)  BMI 18.31 kg/m2  LMP 07/25/2011  Body mass index is 18.31 kg/(m^2).  General appearance : Well developed well nourished female. No acute distress HEENT: Eyes: no retinal hemorrhage or exudates,  Neck supple, trachea midline, no carotid bruits, no thyroidmegaly Lungs: Clear to auscultation, no rhonchi or wheezes, or rib retractions  Heart: Regular rate and rhythm, no murmurs or gallops Breast:Examined in sitting and supine position were symmetrical in appearance, no palpable masses or tenderness,  no skin retraction, no nipple inversion, no nipple discharge, no skin discoloration, no axillary or supraclavicular lymphadenopathy Abdomen: no palpable masses or tenderness, no rebound or guarding Extremities: no edema or skin discoloration or tenderness  Pelvic:  Bartholin, Urethra, Skene Glands: Within normal limits             Vagina: No gross lesions or discharge  Cervix: No gross lesions or discharge  Uterus  anteverted, normal size, shape and consistency, non-tender and mobile  Adnexa  Without masses or tenderness  Anus and perineum  normal   Rectovaginal  normal sphincter tone without palpated masses  or tenderness             Hemoccult recent bowel resection within 12 months. PCP will provide fecal Hemoccult cards     Assessment/Plan:  54 y.o. female for annual exam who is menopausal. Her last menstrual cycle was at the age of 54. We had an extensive discussion of the women's health initiative study. We discussed the risk benefits and pros and cons of hormone replacement therapy to include DVT and pulmonary embolism as well as the risk of  breast cancer. Patient is adamant of using topical hormone replacement therapy since that her main problem is usually insomnia. I have recommended that patient use Relizen which is a natural nonhormonal nutritional supplement for relief of hot flashes associated with the menopause. She can take one tablet twice a day. She can also use peppermint oil  she can apply two dabs behind each ear 2-3 times a day when necessary. She will also will use Astepro glide when necessary intercourse. We discussed importance of calcium vitamin D and regular exercise for osteoporosis prevention. She will schedule a bone density studies urine office in the next few weeks. Her Pap smear with HPV screening was done today. Her PCP will be doing the remainder her blood work.    Terrance Mass MD, 12:07 PM 03/24/2015

## 2015-03-30 LAB — CYTOLOGY - PAP

## 2015-03-31 ENCOUNTER — Telehealth: Payer: Self-pay | Admitting: Gynecology

## 2015-03-31 NOTE — Telephone Encounter (Signed)
03/31/15-I checked the patient's benefits for the 77080 dexa scan to be done here in our office with her [facility] plan and they apply it to her unmet $500 deductible. Allowable for this code is $221.60 per Gretta Arab and would be the patient's responsibility if she decides to have the test done/wl

## 2015-04-14 ENCOUNTER — Telehealth: Payer: Self-pay | Admitting: Internal Medicine

## 2015-04-14 NOTE — Telephone Encounter (Signed)
Okay to order?

## 2015-04-14 NOTE — Telephone Encounter (Signed)
Pt needs an order for a dexa to be put in by Dr Regis Bill. She wants to have it done on the same day as her physical so it is covered under her copay. Dr Toney Rakes was not able to accomodate this for her so his order will not work at [employer] needs the order from Dr Regis Bill.

## 2015-04-15 NOTE — Telephone Encounter (Signed)
Ok

## 2015-04-16 ENCOUNTER — Other Ambulatory Visit: Payer: Self-pay | Admitting: Family Medicine

## 2015-04-16 DIAGNOSIS — Z13820 Encounter for screening for osteoporosis: Secondary | ICD-10-CM

## 2015-04-16 NOTE — Telephone Encounter (Signed)
Order placed in the system. Pt notified. 

## 2015-05-12 ENCOUNTER — Other Ambulatory Visit (INDEPENDENT_AMBULATORY_CARE_PROVIDER_SITE_OTHER): Payer: BLUE CROSS/BLUE SHIELD

## 2015-05-12 DIAGNOSIS — Z0000 Encounter for general adult medical examination without abnormal findings: Secondary | ICD-10-CM | POA: Diagnosis not present

## 2015-05-12 LAB — BASIC METABOLIC PANEL
BUN: 17 mg/dL (ref 6–23)
CALCIUM: 9.5 mg/dL (ref 8.4–10.5)
CO2: 29 meq/L (ref 19–32)
Chloride: 104 mEq/L (ref 96–112)
Creatinine, Ser: 0.83 mg/dL (ref 0.40–1.20)
GFR: 75.95 mL/min (ref >60.00)
GLUCOSE: 91 mg/dL (ref 70–99)
POTASSIUM: 4.6 meq/L (ref 3.5–5.1)
SODIUM: 141 meq/L (ref 135–145)

## 2015-05-12 LAB — CBC WITH DIFFERENTIAL/PLATELET
Basophils Absolute: 0 10*3/uL (ref 0.0–0.1)
Basophils Relative: 0.3 % (ref 0.0–3.0)
EOS PCT: 1.6 % (ref 0.0–5.0)
Eosinophils Absolute: 0.1 10*3/uL (ref 0.0–0.7)
HCT: 38.3 % (ref 36.0–46.0)
Hemoglobin: 12.9 g/dL (ref 12.0–15.0)
LYMPHS ABS: 1.2 10*3/uL (ref 0.7–4.0)
Lymphocytes Relative: 19.9 % (ref 12.0–46.0)
MCHC: 33.8 g/dL (ref 30.0–36.0)
MCV: 92.8 fl (ref 78.0–100.0)
MONO ABS: 0.4 10*3/uL (ref 0.1–1.0)
MONOS PCT: 7 % (ref 3.0–12.0)
NEUTROS ABS: 4.4 10*3/uL (ref 1.4–7.7)
NEUTROS PCT: 71.2 % (ref 43.0–77.0)
PLATELETS: 217 10*3/uL (ref 150.0–400.0)
RBC: 4.13 Mil/uL (ref 3.87–5.11)
RDW: 12.8 % (ref 11.5–15.5)
WBC: 6.2 10*3/uL (ref 4.0–10.5)

## 2015-05-12 LAB — HEPATIC FUNCTION PANEL
ALBUMIN: 4.5 g/dL (ref 3.5–5.2)
ALK PHOS: 55 U/L (ref 39–117)
ALT: 11 U/L (ref 0–35)
AST: 17 U/L (ref 0–37)
BILIRUBIN DIRECT: 0.1 mg/dL (ref 0.0–0.3)
BILIRUBIN TOTAL: 0.5 mg/dL (ref 0.2–1.2)
Total Protein: 7.2 g/dL (ref 6.0–8.3)

## 2015-05-12 LAB — TSH: TSH: 4.25 u[IU]/mL (ref 0.35–4.50)

## 2015-05-12 LAB — LIPID PANEL
CHOL/HDL RATIO: 3
Cholesterol: 200 mg/dL (ref 0–200)
HDL: 67.5 mg/dL (ref >39.00)
LDL Cholesterol: 117 mg/dL — ABNORMAL HIGH (ref 0–99)
NONHDL: 132.32
Triglycerides: 77 mg/dL (ref 0.0–149.0)
VLDL: 15.4 mg/dL (ref 0.0–40.0)

## 2015-05-19 ENCOUNTER — Ambulatory Visit (INDEPENDENT_AMBULATORY_CARE_PROVIDER_SITE_OTHER)
Admission: RE | Admit: 2015-05-19 | Discharge: 2015-05-19 | Disposition: A | Discharge status: Home / Self Care | Payer: BLUE CROSS/BLUE SHIELD | Source: Ambulatory Visit | Attending: Internal Medicine | Admitting: Internal Medicine

## 2015-05-19 ENCOUNTER — Encounter: Payer: Self-pay | Admitting: Internal Medicine

## 2015-05-19 ENCOUNTER — Ambulatory Visit (INDEPENDENT_AMBULATORY_CARE_PROVIDER_SITE_OTHER): Payer: BLUE CROSS/BLUE SHIELD | Admitting: Internal Medicine

## 2015-05-19 VITALS — BP 124/80 | Temp 97.9°F | Ht 64.0 in | Wt 112.2 lb

## 2015-05-19 DIAGNOSIS — Z23 Encounter for immunization: Secondary | ICD-10-CM

## 2015-05-19 DIAGNOSIS — Z13820 Encounter for screening for osteoporosis: Secondary | ICD-10-CM

## 2015-05-19 DIAGNOSIS — Z0000 Encounter for general adult medical examination without abnormal findings: Secondary | ICD-10-CM

## 2015-05-19 DIAGNOSIS — Z79899 Other long term (current) drug therapy: Secondary | ICD-10-CM

## 2015-05-19 DIAGNOSIS — M7989 Other specified soft tissue disorders: Secondary | ICD-10-CM

## 2015-05-19 MED ORDER — ALPRAZOLAM 0.25 MG PO TABS: ORAL_TABLET | ORAL | Status: DC

## 2015-05-19 NOTE — Progress Notes (Signed)
Pre visit review using our clinic review tool, if applicable. No additional management support is needed unless otherwise documented below in the visit note. \ Chief Complaint  Patient presents with  . Annual Exam    HPI: Patient  Jessica Chan  54 y.o. comes in today for Preventive Health Care visit   Battling sleep hot flushes but doing we..  Had part colectomy last winter and eating nmore normal now.   Top of right foot  Swollen and tender on toe dorsiflexion for a week or so no injury or pain on bottom of foot   bmi was low at her insurance assessment   Red area ion cheek used old desonide some help has appt with derm in december  Health Maintenance  Topic Date Due  . Hepatitis C Screening  [birth year]  . TETANUS/TDAP  09/05/1979  . HIV Screening  05/18/2016 (Originally 09/05/1975)  . INFLUENZA VACCINE  02/22/2016  . MAMMOGRAM  02/28/2017  . PAP SMEAR  03/23/2018  . COLONOSCOPY  01/28/2020   Health Maintenance Review LIFESTYLE:  Exercise:  Yes no limitations Tobacco/ETS:n Alcohol: 1 per day when husband home otherwise weekend ocass Sugar beverages:n Sleep:interrupted 4 hours  ocass use ambien  lsi  Drug use: no    ROS:  See above  GEN/ HEENT: No fever, significant weight changes sweats headaches vision problems hearing changes, CV/ PULM; No chest pain shortness of breath cough, syncope,edema  change in exercise tolerance. GI /GU: No adominal pain, vomiting, change in bowel habits. No blood in the stool. No significant GU symptoms. SKIN/HEME: ,no acute skin rashes suspicious lesions or bleeding. No lymphadenopathy, nodules, masses.  NEURO/ PSYCH:  No neurologic signs such as weakness numbness. No depression anxiety. IMM/ Allergy: No unusual infections.  Allergy .   REST of 12 system review negative except as per HPI   Past Medical History  Diagnosis Date  . Allergic rhinitis   . GERD (gastroesophageal reflux disease)   . Hemangioma of liver     incidental  finding-large, right and left lobes of liver   . Anxiety disorder   . Depression   . IBS (irritable bowel syndrome)   . Diverticulosis   . Hiatal hernia   . Sinusitis, acute maxillary 07/29/2012    right  hx of sinus surgery  fu if needed recurrecne    . BLEPHARITIS 05/27/2007    Qualifier: Diagnosis of  By: Hulan Saas, CMA (AAMA), Quita Skye   . Diverticulitis   . Diverticulitis of colon - recurrent 10/02/2013  . ESOPHAGEAL STRICTURE 11/15/2009    Qualifier: Diagnosis of  By: Trellis Paganini PA-c, Amy S   . PONV (postoperative nausea and vomiting)     SEVERE NAUSEA & VOMITING  . Anxiety     Past Surgical History  Procedure Laterality Date  . Nasal sinus surgery    . Ulnar nerve right arm    . Tonsillectomy    . Adnoidectomy    . Endoscopy with dialation    . Colonoscopy    . Upper gastrointestinal endoscopy    . Proctoscopy N/A 07/02/2014    Procedure: PROCTOSCOPY;  Surgeon: Michael Boston, MD;  Location: [facility];  Service: General;  Laterality: N/A;  . Robot assisted laparoscopic partial colectomy      Family History  Problem Relation Age of Onset  . Lung cancer Mother   . Heart disease Father   . Autism Son   . Colon cancer Maternal Uncle   . Breast cancer Maternal Grandmother  Social History   Social History  . Marital Status: Married    Spouse Name: N/A  . Number of Children: 2  . Years of Education: N/A   Occupational History  . Caregiver    Social History Main Topics  . Smoking status: Never Smoker   . Smokeless tobacco: Never Used  . Alcohol Use: 0.0 oz/week    0 Standard drinks or equivalent per week     Comment: Social Drinker--SEVERAL DRINKS A WEEK  . Drug Use: No  . Sexual Activity: Yes   Other Topics Concern  . None   Social History Narrative   Married HHof 4 one sone autistic   Husband had prostate cancer in last few years   Some exercise  Runs    No tobacco    Outpatient Prescriptions Prior to Visit  Medication Sig Dispense Refill  .  buPROPion (WELLBUTRIN XL) 150 MG 24 hr tablet TAKE 1 TABLET EVERY MORNING 90 tablet 0  . Ketotifen Fumarate (THERA TEARS ALLERGY OP) Apply 2 drops to eye 2 (two) times daily.    . Multiple Vitamin (MULTIVITAMIN) tablet Take 1 tablet by mouth every morning.     Marland Kitchen ALPRAZolam (XANAX) 0.25 MG tablet take 1 tablet by mouth if needed 30 tablet 0  . zolpidem (AMBIEN) 10 MG tablet Take 1 tablet (10 mg total) by mouth at bedtime as needed for sleep. 30 tablet 2  . azithromycin (AZASITE) 1 % ophthalmic solution Place 1 drop into both eyes 2 (two) times daily.    Marland Kitchen buPROPion (WELLBUTRIN XL) 150 MG 24 hr tablet Take 1 tablet (150 mg total) by mouth every morning. 30 tablet 3  . ibuprofen (ADVIL,MOTRIN) 200 MG tablet Take 400 mg by mouth every 6 (six) hours as needed for headache or mild pain.    . traMADol (ULTRAM) 50 MG tablet Take 1-2 tablets (50-100 mg total) by mouth every 6 (six) hours as needed for moderate pain or severe pain. (Patient not taking: Reported on 03/24/2015) 30 tablet 0   No facility-administered medications prior to visit.     EXAM:  BP 124/80 mmHg  Temp(Src) 97.9 F (36.6 C) (Oral)  Ht _0  (1.626 m)  Wt 112 lb 3.2 oz (50.894 kg)  BMI 19.25 kg/m2  LMP 07/25/2011  Body mass index is 19.25 kg/(m^2).  Physical Exam: Vital signs reviewed FKC:LEXN is a well-developed well-nourished alert cooperative    who appearsr stated age in no acute distress.  HEENT: normocephalic atraumatic , Eyes: PERRL EOM's full, conjunctiva clear, Nares: paten,t no deformity discharge or tenderness., Ears: no deformity EAC's clear TMs with normal landmarks. Mouth: clear OP, no lesions, edema.  Moist mucous membranes. Dentition in adequate repair. NECK: supple without masses, thyromegaly or bruits. CHEST/PULM:  Clear to auscultation and percussion breath sounds equal no wheeze , rales or rhonchi. No chest wall deformities or tenderness. CV: PMI is nondisplaced, S1 S2 no gallops, murmurs, rubs. Peripheral  pulses are full without delay.No JVD .  ABDOMEN: Bowel sounds normal nontender  No guard or rebound, no hepato splenomegal no CVA tenderness.  No hernia. Well healed lapar oscopy scars  Extremtities:  No clubbing cyanosis or edema,  righ foot dorsal area over 2 and 3 metatarsal with swelling no redness  Diffuse tendernses  No crepitus and o metatarsal pain or bony tenderness nv okno focal atrophy NEURO:  Oriented x3, cranial nerves 3-12 appear to be intact, no obvious focal weakness,gait within normal limits no abnormal reflexes or asymmetrical SKIN: No  acute rashes normal turgor, color, no bruising or petechiae. Faint cheek erythema no lesion PSYCH: Oriented, good eye contact, no obvious depression anxiety, cognition and judgment appear normal. LN: no cervical axillary inguinal adenopathy  Lab Results  Component Value Date   WBC 6.2 05/12/2015   HGB 12.9 05/12/2015   HCT 38.3 05/12/2015   PLT 217.0 05/12/2015   GLUCOSE 91 05/12/2015   CHOL 200 05/12/2015   TRIG 77.0 05/12/2015   HDL 67.50 05/12/2015   LDLCALC 117* 05/12/2015   ALT 11 05/12/2015   AST 17 05/12/2015   NA 141 05/12/2015   K 4.6 05/12/2015   CL 104 05/12/2015   CREATININE 0.83 05/12/2015   BUN 17 05/12/2015   CO2 29 05/12/2015   TSH 4.25 05/12/2015   HGBA1C 5.1 06/25/2014    ASSESSMENT AND PLAN:  Discussed the following assessment and plan:  Visit for preventive health examination  Medication management - situational anxiety travel can use alprax as needed with caution  Need for Tdap vaccination - Plan: Tdap vaccine greater than or equal to 7yo IM  Foot swelling - right ? tendinitis  no bony tendernss  although if persits see sm  r/i stress fx etc .  nsaid in short run. Counseled. Patient Care Team: Burnis Medin, MD as PCP - General Gatha Mayer, MD as Attending Physician (Gastroenterology) Patient Instructions  Continue lifestyle intervention healthy eating and exercise .  Limit use of ambien as  discussed .  Cholesterol  Ratio is favorable  1.4% 10 years risk  fo vascular events  Not in the statin benefit category at this time .  Mediterranean diet is heart healthy   Health Maintenance, Female Adopting a healthy lifestyle and getting preventive care can go a long way to promote health and wellness. Talk with your health care provider about what schedule of regular examinations is right for you. This is a good chance for you to check in with your provider about disease prevention and staying healthy. In between checkups, there are plenty of things you can do on your own. Experts have done a lot of research about which lifestyle changes and preventive measures are most likely to keep you healthy. Ask your health care provider for more information. WEIGHT AND DIET  Eat a healthy diet  Be sure to include plenty of vegetables, fruits, low-fat dairy products, and lean protein.  Do not eat a lot of foods high in solid fats, added sugars, or salt.  Get regular exercise. This is one of the most important things you can do for your health.  Most adults should exercise for at least 150 minutes each week. The exercise should increase your heart rate and make you sweat (moderate-intensity exercise).  Most adults should also do strengthening exercises at least twice a week. This is in addition to the moderate-intensity exercise.  Maintain a healthy weight  Body mass index (BMI) is a measurement that can be used to identify possible weight problems. It estimates body fat based on height and weight. Your health care provider can help determine your BMI and help you achieve or maintain a healthy weight.  For females 68 years of age and older:   A BMI below 18.5 is considered underweight.  A BMI of 18.5 to 24.9 is normal.  A BMI of 25 to 29.9 is considered overweight.  A BMI of 30 and above is considered obese.  Watch levels of cholesterol and blood lipids  You should start having your  blood tested for lipids and cholesterol at 54 years of age, then have this test every 5 years.  You may need to have your cholesterol levels checked more often if:  Your lipid or cholesterol levels are high.  You are older than 54 years of age.  You are at high risk for heart disease.  CANCER SCREENING   Lung Cancer  Lung cancer screening is recommended for adults 45-58 years old who are at high risk for lung cancer because of a history of smoking.  A yearly low-dose CT scan of the lungs is recommended for people who:  Currently smoke.  Have quit within the past 15 years.  Have at least a 30-pack-year history of smoking. A pack year is smoking an average of one pack of cigarettes a day for 1 year.  Yearly screening should continue until it has been 15 years since you quit.  Yearly screening should stop if you develop a health problem that would prevent you from having lung cancer treatment.  Breast Cancer  Practice breast self-awareness. This means understanding how your breasts normally appear and feel.  It also means doing regular breast self-exams. Let your health care provider know about any changes, no matter how small.  If you are in your 20s or 30s, you should have a clinical breast exam (CBE) by a health care provider every 1-3 years as part of a regular health exam.  If you are 44 or older, have a CBE every year. Also consider having a breast X-ray (mammogram) every year.  If you have a family history of breast cancer, talk to your health care provider about genetic screening.  If you are at high risk for breast cancer, talk to your health care provider about having an MRI and a mammogram every year.  Breast cancer gene (BRCA) assessment is recommended for women who have family members with BRCA-related cancers. BRCA-related cancers include:  Breast.  Ovarian.  Tubal.  Peritoneal cancers.  Results of the assessment will determine the need for genetic  counseling and BRCA1 and BRCA2 testing. Cervical Cancer Your health care provider may recommend that you be screened regularly for cancer of the pelvic organs (ovaries, uterus, and vagina). This screening involves a pelvic examination, including checking for microscopic changes to the surface of your cervix (Pap test). You may be encouraged to have this screening done every 3 years, beginning at age 29.  For women ages 10-65, health care providers may recommend pelvic exams and Pap testing every 3 years, or they may recommend the Pap and pelvic exam, combined with testing for human papilloma virus (HPV), every 5 years. Some types of HPV increase your risk of cervical cancer. Testing for HPV may also be done on women of any age with unclear Pap test results.  Other health care providers may not recommend any screening for nonpregnant women who are considered low risk for pelvic cancer and who do not have symptoms. Ask your health care provider if a screening pelvic exam is right for you.  If you have had past treatment for cervical cancer or a condition that could lead to cancer, you need Pap tests and screening for cancer for at least 20 years after your treatment. If Pap tests have been discontinued, your risk factors (such as having a new sexual partner) need to be reassessed to determine if screening should resume. Some women have medical problems that increase the chance of getting cervical cancer. In these cases, your health care provider  may recommend more frequent screening and Pap tests. Colorectal Cancer  This type of cancer can be detected and often prevented.  Routine colorectal cancer screening usually begins at 54 years of age and continues through 54 years of age.  Your health care provider may recommend screening at an earlier age if you have risk factors for colon cancer.  Your health care provider may also recommend using home test kits to check for hidden blood in the stool.  A  small camera at the end of a tube can be used to examine your colon directly (sigmoidoscopy or colonoscopy). This is done to check for the earliest forms of colorectal cancer.  Routine screening usually begins at age 77.  Direct examination of the colon should be repeated every 5-10 years through 55 years of age. However, you may need to be screened more often if early forms of precancerous polyps or small growths are found. Skin Cancer  Check your skin from head to toe regularly.  Tell your health care provider about any new moles or changes in moles, especially if there is a change in a mole's shape or color.  Also tell your health care provider if you have a mole that is larger than the size of a pencil eraser.  Always use sunscreen. Apply sunscreen liberally and repeatedly throughout the day.  Protect yourself by wearing long sleeves, pants, a wide-brimmed hat, and sunglasses whenever you are outside. HEART DISEASE, DIABETES, AND HIGH BLOOD PRESSURE   High blood pressure causes heart disease and increases the risk of stroke. High blood pressure is more likely to develop in:  People who have blood pressure in the high end of the normal range (130-139/85-89 mm Hg).  People who are overweight or obese.  People who are African American.  If you are 78-74 years of age, have your blood pressure checked every 3-5 years. If you are 13 years of age or older, have your blood pressure checked every year. You should have your blood pressure measured twice--once when you are at a hospital or clinic, and once when you are not at a hospital or clinic. Record the average of the two measurements. To check your blood pressure when you are not at a hospital or clinic, you can use:  An automated blood pressure machine at a pharmacy.  A home blood pressure monitor.  If you are between 59 years and 65 years old, ask your health care provider if you should take aspirin to prevent strokes.  Have  regular diabetes screenings. This involves taking a blood sample to check your fasting blood sugar level.  If you are at a normal weight and have a low risk for diabetes, have this test once every three years after 54 years of age.  If you are overweight and have a high risk for diabetes, consider being tested at a younger age or more often. PREVENTING INFECTION  Hepatitis B  If you have a higher risk for hepatitis B, you should be screened for this virus. You are considered at high risk for hepatitis B if:  You were born in a country where hepatitis B is common. Ask your health care provider which countries are considered high risk.  Your parents were born in a high-risk country, and you have not been immunized against hepatitis B (hepatitis B vaccine).  You have HIV or AIDS.  You use needles to inject street drugs.  You live with someone who has hepatitis B.  You have had  sex with someone who has hepatitis B.  You get hemodialysis treatment.  You take certain medicines for conditions, including cancer, organ transplantation, and autoimmune conditions. Hepatitis C  Blood testing is recommended for:  Everyone born from [birth year] through 1965.  Anyone with known risk factors for hepatitis C. Sexually transmitted infections (STIs)  You should be screened for sexually transmitted infections (STIs) including gonorrhea and chlamydia if:  You are sexually active and are younger than 54 years of age.  You are older than 54 years of age and your health care provider tells you that you are at risk for this type of infection.  Your sexual activity has changed since you were last screened and you are at an increased risk for chlamydia or gonorrhea. Ask your health care provider if you are at risk.  If you do not have HIV, but are at risk, it may be recommended that you take a prescription medicine daily to prevent HIV infection. This is called pre-exposure prophylaxis (PrEP). You are  considered at risk if:  You are sexually active and do not regularly use condoms or know the HIV status of your partner(s).  You take drugs by injection.  You are sexually active with a partner who has HIV. Talk with your health care provider about whether you are at high risk of being infected with HIV. If you choose to begin PrEP, you should first be tested for HIV. You should then be tested every 3 months for as long as you are taking PrEP.  PREGNANCY   If you are premenopausal and you may become pregnant, ask your health care provider about preconception counseling.  If you may become pregnant, take 400 to 800 micrograms (mcg) of folic acid every day.  If you want to prevent pregnancy, talk to your health care provider about birth control (contraception). OSTEOPOROSIS AND MENOPAUSE   Osteoporosis is a disease in which the bones lose minerals and strength with aging. This can result in serious bone fractures. Your risk for osteoporosis can be identified using a bone density scan.  If you are 8 years of age or older, or if you are at risk for osteoporosis and fractures, ask your health care provider if you should be screened.  Ask your health care provider whether you should take a calcium or vitamin D supplement to lower your risk for osteoporosis.  Menopause may have certain physical symptoms and risks.  Hormone replacement therapy may reduce some of these symptoms and risks. Talk to your health care provider about whether hormone replacement therapy is right for you.  HOME CARE INSTRUCTIONS   Schedule regular health, dental, and eye exams.  Stay current with your immunizations.   Do not use any tobacco products including cigarettes, chewing tobacco, or electronic cigarettes.  If you are pregnant, do not drink alcohol.  If you are breastfeeding, limit how much and how often you drink alcohol.  Limit alcohol intake to no more than 1 drink per day for nonpregnant women. One  drink equals 12 ounces of beer, 5 ounces of wine, or 1 ounces of hard liquor.  Do not use street drugs.  Do not share needles.  Ask your health care provider for help if you need support or information about quitting drugs.  Tell your health care provider if you often feel depressed.  Tell your health care provider if you have ever been abused or do not feel safe at home.   This information is not intended to replace  advice given to you by your health care provider. Make sure you discuss any questions you have with your health care provider.   Document Released: 01/23/2011 Document Revised: 07/31/2014 Document Reviewed: 06/11/2013 Elsevier Interactive Patient Education 2016 [place] K. Zymarion Favorite M.D.

## 2015-05-19 NOTE — Patient Instructions (Signed)
Continue lifestyle intervention healthy eating and exercise .  Limit use of ambien as discussed .  Cholesterol  Ratio is favorable  1.4% 10 years risk  fo vascular events  Not in the statin benefit category at this time .  Mediterranean diet is heart healthy   Health Maintenance, Female Adopting a healthy lifestyle and getting preventive care can go a long way to promote health and wellness. Talk with your health care provider about what schedule of regular examinations is right for you. This is a good chance for you to check in with your provider about disease prevention and staying healthy. In between checkups, there are plenty of things you can do on your own. Experts have done a lot of research about which lifestyle changes and preventive measures are most likely to keep you healthy. Ask your health care provider for more information. WEIGHT AND DIET  Eat a healthy diet  Be sure to include plenty of vegetables, fruits, low-fat dairy products, and lean protein.  Do not eat a lot of foods high in solid fats, added sugars, or salt.  Get regular exercise. This is one of the most important things you can do for your health.  Most adults should exercise for at least 150 minutes each week. The exercise should increase your heart rate and make you sweat (moderate-intensity exercise).  Most adults should also do strengthening exercises at least twice a week. This is in addition to the moderate-intensity exercise.  Maintain a healthy weight  Body mass index (BMI) is a measurement that can be used to identify possible weight problems. It estimates body fat based on height and weight. Your health care provider can help determine your BMI and help you achieve or maintain a healthy weight.  For females 54 years of age and older:   A BMI below 18.5 is considered underweight.  A BMI of 18.5 to 24.9 is normal.  A BMI of 25 to 29.9 is considered overweight.  A BMI of 30 and above is considered  obese.  Watch levels of cholesterol and blood lipids  You should start having your blood tested for lipids and cholesterol at 54 years of age, then have this test every 5 years.  You may need to have your cholesterol levels checked more often if:  Your lipid or cholesterol levels are high.  You are older than 54 years of age.  You are at high risk for heart disease.  CANCER SCREENING   Lung Cancer  Lung cancer screening is recommended for adults 89-48 years old who are at high risk for lung cancer because of a history of smoking.  A yearly low-dose CT scan of the lungs is recommended for people who:  Currently smoke.  Have quit within the past 15 years.  Have at least a 30-pack-year history of smoking. A pack year is smoking an average of one pack of cigarettes a day for 1 year.  Yearly screening should continue until it has been 15 years since you quit.  Yearly screening should stop if you develop a health problem that would prevent you from having lung cancer treatment.  Breast Cancer  Practice breast self-awareness. This means understanding how your breasts normally appear and feel.  It also means doing regular breast self-exams. Let your health care provider know about any changes, no matter how small.  If you are in your 20s or 30s, you should have a clinical breast exam (CBE) by a health care provider every 1-3 years  as part of a regular health exam.  If you are 27 or older, have a CBE every year. Also consider having a breast X-ray (mammogram) every year.  If you have a family history of breast cancer, talk to your health care provider about genetic screening.  If you are at high risk for breast cancer, talk to your health care provider about having an MRI and a mammogram every year.  Breast cancer gene (BRCA) assessment is recommended for women who have family members with BRCA-related cancers. BRCA-related cancers  include:  Breast.  Ovarian.  Tubal.  Peritoneal cancers.  Results of the assessment will determine the need for genetic counseling and BRCA1 and BRCA2 testing. Cervical Cancer Your health care provider may recommend that you be screened regularly for cancer of the pelvic organs (ovaries, uterus, and vagina). This screening involves a pelvic examination, including checking for microscopic changes to the surface of your cervix (Pap test). You may be encouraged to have this screening done every 3 years, beginning at age 77.  For women ages 17-65, health care providers may recommend pelvic exams and Pap testing every 3 years, or they may recommend the Pap and pelvic exam, combined with testing for human papilloma virus (HPV), every 5 years. Some types of HPV increase your risk of cervical cancer. Testing for HPV may also be done on women of any age with unclear Pap test results.  Other health care providers may not recommend any screening for nonpregnant women who are considered low risk for pelvic cancer and who do not have symptoms. Ask your health care provider if a screening pelvic exam is right for you.  If you have had past treatment for cervical cancer or a condition that could lead to cancer, you need Pap tests and screening for cancer for at least 20 years after your treatment. If Pap tests have been discontinued, your risk factors (such as having a new sexual partner) need to be reassessed to determine if screening should resume. Some women have medical problems that increase the chance of getting cervical cancer. In these cases, your health care provider may recommend more frequent screening and Pap tests. Colorectal Cancer  This type of cancer can be detected and often prevented.  Routine colorectal cancer screening usually begins at 54 years of age and continues through 54 years of age.  Your health care provider may recommend screening at an earlier age if you have risk factors for  colon cancer.  Your health care provider may also recommend using home test kits to check for hidden blood in the stool.  A small camera at the end of a tube can be used to examine your colon directly (sigmoidoscopy or colonoscopy). This is done to check for the earliest forms of colorectal cancer.  Routine screening usually begins at age 7.  Direct examination of the colon should be repeated every 5-10 years through 54 years of age. However, you may need to be screened more often if early forms of precancerous polyps or small growths are found. Skin Cancer  Check your skin from head to toe regularly.  Tell your health care provider about any new moles or changes in moles, especially if there is a change in a mole's shape or color.  Also tell your health care provider if you have a mole that is larger than the size of a pencil eraser.  Always use sunscreen. Apply sunscreen liberally and repeatedly throughout the day.  Protect yourself by wearing long sleeves,  pants, a wide-brimmed hat, and sunglasses whenever you are outside. HEART DISEASE, DIABETES, AND HIGH BLOOD PRESSURE   High blood pressure causes heart disease and increases the risk of stroke. High blood pressure is more likely to develop in:  People who have blood pressure in the high end of the normal range (130-139/85-89 mm Hg).  People who are overweight or obese.  People who are African American.  If you are 24-66 years of age, have your blood pressure checked every 3-5 years. If you are 81 years of age or older, have your blood pressure checked every year. You should have your blood pressure measured twice--once when you are at a hospital or clinic, and once when you are not at a hospital or clinic. Record the average of the two measurements. To check your blood pressure when you are not at a hospital or clinic, you can use:  An automated blood pressure machine at a pharmacy.  A home blood pressure monitor.  If you  are between 16 years and 28 years old, ask your health care provider if you should take aspirin to prevent strokes.  Have regular diabetes screenings. This involves taking a blood sample to check your fasting blood sugar level.  If you are at a normal weight and have a low risk for diabetes, have this test once every three years after 54 years of age.  If you are overweight and have a high risk for diabetes, consider being tested at a younger age or more often. PREVENTING INFECTION  Hepatitis B  If you have a higher risk for hepatitis B, you should be screened for this virus. You are considered at high risk for hepatitis B if:  You were born in a country where hepatitis B is common. Ask your health care provider which countries are considered high risk.  Your parents were born in a high-risk country, and you have not been immunized against hepatitis B (hepatitis B vaccine).  You have HIV or AIDS.  You use needles to inject street drugs.  You live with someone who has hepatitis B.  You have had sex with someone who has hepatitis B.  You get hemodialysis treatment.  You take certain medicines for conditions, including cancer, organ transplantation, and autoimmune conditions. Hepatitis C  Blood testing is recommended for:  Everyone born from [birth year] through 1965.  Anyone with known risk factors for hepatitis C. Sexually transmitted infections (STIs)  You should be screened for sexually transmitted infections (STIs) including gonorrhea and chlamydia if:  You are sexually active and are younger than 54 years of age.  You are older than 54 years of age and your health care provider tells you that you are at risk for this type of infection.  Your sexual activity has changed since you were last screened and you are at an increased risk for chlamydia or gonorrhea. Ask your health care provider if you are at risk.  If you do not have HIV, but are at risk, it may be recommended that you  take a prescription medicine daily to prevent HIV infection. This is called pre-exposure prophylaxis (PrEP). You are considered at risk if:  You are sexually active and do not regularly use condoms or know the HIV status of your partner(s).  You take drugs by injection.  You are sexually active with a partner who has HIV. Talk with your health care provider about whether you are at high risk of being infected with HIV. If you choose to  begin PrEP, you should first be tested for HIV. You should then be tested every 3 months for as long as you are taking PrEP.  PREGNANCY   If you are premenopausal and you may become pregnant, ask your health care provider about preconception counseling.  If you may become pregnant, take 400 to 800 micrograms (mcg) of folic acid every day.  If you want to prevent pregnancy, talk to your health care provider about birth control (contraception). OSTEOPOROSIS AND MENOPAUSE   Osteoporosis is a disease in which the bones lose minerals and strength with aging. This can result in serious bone fractures. Your risk for osteoporosis can be identified using a bone density scan.  If you are 23 years of age or older, or if you are at risk for osteoporosis and fractures, ask your health care provider if you should be screened.  Ask your health care provider whether you should take a calcium or vitamin D supplement to lower your risk for osteoporosis.  Menopause may have certain physical symptoms and risks.  Hormone replacement therapy may reduce some of these symptoms and risks. Talk to your health care provider about whether hormone replacement therapy is right for you.  HOME CARE INSTRUCTIONS   Schedule regular health, dental, and eye exams.  Stay current with your immunizations.   Do not use any tobacco products including cigarettes, chewing tobacco, or electronic cigarettes.  If you are pregnant, do not drink alcohol.  If you are breastfeeding, limit how  much and how often you drink alcohol.  Limit alcohol intake to no more than 1 drink per day for nonpregnant women. One drink equals 12 ounces of beer, 5 ounces of wine, or 1 ounces of hard liquor.  Do not use street drugs.  Do not share needles.  Ask your health care provider for help if you need support or information about quitting drugs.  Tell your health care provider if you often feel depressed.  Tell your health care provider if you have ever been abused or do not feel safe at home.   This information is not intended to replace advice given to you by your health care provider. Make sure you discuss any questions you have with your health care provider.   Document Released: 01/23/2011 Document Revised: 07/31/2014 Document Reviewed: 06/11/2013 Elsevier Interactive Patient Education [employer].

## 2015-08-17 ENCOUNTER — Other Ambulatory Visit: Payer: Self-pay | Admitting: Internal Medicine

## 2015-08-18 NOTE — Telephone Encounter (Signed)
Sent to the pharmacy by e-scribe. 

## 2015-09-08 ENCOUNTER — Ambulatory Visit: Payer: BLUE CROSS/BLUE SHIELD | Admitting: Internal Medicine

## 2015-09-15 ENCOUNTER — Ambulatory Visit (INDEPENDENT_AMBULATORY_CARE_PROVIDER_SITE_OTHER): Payer: BLUE CROSS/BLUE SHIELD | Admitting: Adult Health

## 2015-09-15 ENCOUNTER — Encounter: Payer: Self-pay | Admitting: Adult Health

## 2015-09-15 VITALS — BP 112/80 | HR 77 | Temp 98.5°F | Ht 64.0 in | Wt 115.2 lb

## 2015-09-15 DIAGNOSIS — J209 Acute bronchitis, unspecified: Secondary | ICD-10-CM | POA: Diagnosis not present

## 2015-09-15 MED ORDER — PREDNISONE 10 MG PO TABS: 10.0000 mg | ORAL_TABLET | Freq: Every day | ORAL | Status: DC

## 2015-09-15 MED ORDER — HYDROCODONE-HOMATROPINE 5-1.5 MG/5ML PO SYRP: 5.0000 mL | ORAL_SOLUTION | Freq: Three times a day (TID) | ORAL | Status: DC | PRN

## 2015-09-15 NOTE — Patient Instructions (Addendum)
It was great meeting you today!  I have sent in a prescription for Prednisone 10 mg tabs, take as directed  40 mg x 3 days 20 mg x 3 days 10 mg x 3 days.   Use the cough syrup at night as it will make you sleepy  Get Mucinex DM at the drug store.   Follow up if no improvement.   Acute Bronchitis Bronchitis is inflammation of the airways that extend from the windpipe into the lungs (bronchi). The inflammation often causes mucus to develop. This leads to a cough, which is the most common symptom of bronchitis.  In acute bronchitis, the condition usually develops suddenly and goes away over time, usually in a couple weeks. Smoking, allergies, and asthma can make bronchitis worse. Repeated episodes of bronchitis may cause further lung problems.  CAUSES Acute bronchitis is most often caused by the same virus that causes a cold. The virus can spread from person to person (contagious) through coughing, sneezing, and touching contaminated objects. SIGNS AND SYMPTOMS   Cough.   Fever.   Coughing up mucus.   Body aches.   Chest congestion.   Chills.   Shortness of breath.   Sore throat.  DIAGNOSIS  Acute bronchitis is usually diagnosed through a physical exam. Your health care provider will also ask you questions about your medical history. Tests, such as chest X-rays, are sometimes done to rule out other conditions.  TREATMENT  Acute bronchitis usually goes away in a couple weeks. Oftentimes, no medical treatment is necessary. Medicines are sometimes given for relief of fever or cough. Antibiotic medicines are usually not needed but may be prescribed in certain situations. In some cases, an inhaler may be recommended to help reduce shortness of breath and control the cough. A cool mist vaporizer may also be used to help thin bronchial secretions and make it easier to clear the chest.  HOME CARE INSTRUCTIONS  Get plenty of rest.   Drink enough fluids to keep your urine  clear or pale yellow (unless you have a medical condition that requires fluid restriction). Increasing fluids may help thin your respiratory secretions (sputum) and reduce chest congestion, and it will prevent dehydration.   Take medicines only as directed by your health care provider.  If you were prescribed an antibiotic medicine, finish it all even if you start to feel better.  Avoid smoking and secondhand smoke. Exposure to cigarette smoke or irritating chemicals will make bronchitis worse. If you are a smoker, consider using nicotine gum or skin patches to help control withdrawal symptoms. Quitting smoking will help your lungs heal faster.   Reduce the chances of another bout of acute bronchitis by washing your hands frequently, avoiding people with cold symptoms, and trying not to touch your hands to your mouth, nose, or eyes.   Keep all follow-up visits as directed by your health care provider.  SEEK MEDICAL CARE IF: Your symptoms do not improve after 1 week of treatment.  SEEK IMMEDIATE MEDICAL CARE IF:  You develop an increased fever or chills.   You have chest pain.   You have severe shortness of breath.  You have bloody sputum.   You develop dehydration.  You faint or repeatedly feel like you are going to pass out.  You develop repeated vomiting.  You develop a severe headache. MAKE SURE YOU:   Understand these instructions.  Will watch your condition.  Will get help right away if you are not doing well or get worse.  This information is not intended to replace advice given to you by your health care provider. Make sure you discuss any questions you have with your health care provider.   Document Released: 08/17/2004 Document Revised: 07/31/2014 Document Reviewed: 12/31/2012 Elsevier Interactive Patient Education [employer].

## 2015-09-15 NOTE — Progress Notes (Signed)
Pre visit review using our clinic review tool, if applicable. No additional management support is needed unless otherwise documented below in the visit note. 

## 2015-09-15 NOTE — Progress Notes (Signed)
Subjective:    Patient ID: Jessica Chan, female    DOB: Dec 03, 1960, 55 y.o.   MRN: [id number]  HPI  55 year old female who presents to the office today for three weeks of upper respiratory type symptoms. Her symptoms started with sinus pain and pressure and rhinorrhea. About a week ago she started to have a cough, sore throat and a feeling of congestion and burning in her chest. She is having trouble sleeping due to the cough.   She does report low grade fevers. Has not been using any OTC medication.    Review of Systems  Constitutional: Positive for fatigue.  HENT: Positive for congestion, postnasal drip, rhinorrhea and sore throat. Negative for ear discharge, ear pain and sinus pressure.   Respiratory: Positive for cough.   Cardiovascular: Negative.   Neurological: Negative.   Hematological: Negative.   All other systems reviewed and are negative.  Past Medical History  Diagnosis Date  . Allergic rhinitis   . GERD (gastroesophageal reflux disease)   . Hemangioma of liver     incidental finding-large, right and left lobes of liver   . Anxiety disorder   . Depression   . IBS (irritable bowel syndrome)   . Diverticulosis   . Hiatal hernia   . Sinusitis, acute maxillary 07/29/2012    right  hx of sinus surgery  fu if needed recurrecne    . BLEPHARITIS 05/27/2007    Qualifier: Diagnosis of  By: Hulan Saas, CMA (AAMA), Quita Skye   . Diverticulitis   . Diverticulitis of colon - recurrent 10/02/2013  . ESOPHAGEAL STRICTURE 11/15/2009    Qualifier: Diagnosis of  By: Trellis Paganini PA-c, Amy S   . PONV (postoperative nausea and vomiting)     SEVERE NAUSEA & VOMITING  . Anxiety     Social History   Social History  . Marital Status: Married    Spouse Name: N/A  . Number of Children: 2  . Years of Education: N/A   Occupational History  . Caregiver    Social History Main Topics  . Smoking status: Never Smoker   . Smokeless tobacco: Never Used  . Alcohol Use: 0.0 oz/week    0  Standard drinks or equivalent per week     Comment: Social Drinker--SEVERAL DRINKS A WEEK  . Drug Use: No  . Sexual Activity: Yes   Other Topics Concern  . Not on file   Social History Narrative   Married HHof 4 one sone autistic   Husband had prostate cancer in last few years   Some exercise  Runs    No tobacco    Past Surgical History  Procedure Laterality Date  . Nasal sinus surgery    . Ulnar nerve right arm    . Tonsillectomy    . Adnoidectomy    . Endoscopy with dialation    . Colonoscopy    . Upper gastrointestinal endoscopy    . Proctoscopy N/A 07/02/2014    Procedure: PROCTOSCOPY;  Surgeon: Michael Boston, MD;  Location: [facility];  Service: General;  Laterality: N/A;  . Robot assisted laparoscopic partial colectomy      Family History  Problem Relation Age of Onset  . Lung cancer Mother   . Heart disease Father   . Autism Son   . Colon cancer Maternal Uncle   . Breast cancer Maternal Grandmother     Allergies  Allergen Reactions  . Amoxicillin-Pot Clavulanate     REACTION: Nausea  . Meperidine Hcl  Red patches all over body  . Oxycodone Nausea Only    Current Outpatient Prescriptions on File Prior to Visit  Medication Sig Dispense Refill  . ALPRAZolam (XANAX) 0.25 MG tablet take 1 tablet by mouth if needed for travel. 30 tablet 0  . buPROPion (WELLBUTRIN XL) 150 MG 24 hr tablet TAKE 1 TABLET EVERY MORNING 90 tablet 2  . Ketotifen Fumarate (THERA TEARS ALLERGY OP) Apply 2 drops to eye 2 (two) times daily.    . Multiple Vitamin (MULTIVITAMIN) tablet Take 1 tablet by mouth every morning.     Marland Kitchen OVER THE COUNTER MEDICATION Relizen    . tretinoin (RETIN-A) 0.025 % cream apply to affected area at bedtime TO DRY SKIN FOR ACNE  0  . zolpidem (AMBIEN) 10 MG tablet Take 1 tablet (10 mg total) by mouth at bedtime as needed for sleep. 30 tablet 2   No current facility-administered medications on file prior to visit.    BP 112/80 mmHg  Pulse 77  Temp(Src) 98.5  F (36.9 C) (Oral)  Ht 5\' 4"  (1.626 m)  Wt 115 lb 3.2 oz (52.254 kg)  BMI 19.76 kg/m2  SpO2 97%  LMP 07/25/2011       Objective:   Physical Exam  Constitutional: She is oriented to person, place, and time. She appears well-developed and well-nourished. No distress.  Appears tired and worn out  HENT:  Head: Normocephalic and atraumatic.  Right Ear: External ear normal.  Left Ear: External ear normal.  Mouth/Throat: Oropharynx is clear and moist. No oropharyngeal exudate.  Trace fluid behind left TM  Neck: Normal range of motion. Neck supple.  Cardiovascular: Normal rate, regular rhythm, normal heart sounds and intact distal pulses.  Exam reveals no gallop and no friction rub.   No murmur heard. Pulmonary/Chest: Effort normal. No respiratory distress. She has wheezes. She has no rales. She exhibits no tenderness.  Increased expiratory phase  Musculoskeletal: Normal range of motion. She exhibits no edema.  Lymphadenopathy:    She has no cervical adenopathy.  Neurological: She is alert and oriented to person, place, and time.  Skin: Skin is warm and dry. No rash noted. She is not diaphoretic. No erythema. No pallor.  Psychiatric: She has a normal mood and affect. Her behavior is normal. Judgment and thought content normal.  Nursing note and vitals reviewed.      Assessment & Plan:  1. Acute bronchitis, unspecified organism - No concern for pneumonia at this time. I do not think this is a bacterial infection.  - HYDROcodone-homatropine (HYCODAN) 5-1.5 MG/5ML syrup; Take 5 mLs by mouth every 8 (eight) hours as needed for cough.  Dispense: 120 mL; Refill: 0 - predniSONE (DELTASONE) 10 MG tablet; Take 1 tablet (10 mg total) by mouth daily with breakfast.  Dispense: 21 tablet; Refill: 0 - Mucinex DM every 12 hours  - Ibuprofen 600mg  every 8 hours as needed - Follow up as needed

## 2015-09-20 ENCOUNTER — Telehealth: Payer: Self-pay | Admitting: Internal Medicine

## 2015-09-20 ENCOUNTER — Ambulatory Visit (INDEPENDENT_AMBULATORY_CARE_PROVIDER_SITE_OTHER): Payer: BLUE CROSS/BLUE SHIELD | Admitting: Internal Medicine

## 2015-09-20 ENCOUNTER — Encounter: Payer: Self-pay | Admitting: Internal Medicine

## 2015-09-20 VITALS — BP 112/74 | HR 83 | Temp 99.1°F | Wt 115.4 lb

## 2015-09-20 DIAGNOSIS — R05 Cough: Secondary | ICD-10-CM | POA: Diagnosis not present

## 2015-09-20 DIAGNOSIS — R053 Chronic cough: Secondary | ICD-10-CM

## 2015-09-20 DIAGNOSIS — K219 Gastro-esophageal reflux disease without esophagitis: Secondary | ICD-10-CM

## 2015-09-20 MED ORDER — BENZONATATE 200 MG PO CAPS
200.0000 mg | ORAL_CAPSULE | Freq: Three times a day (TID) | ORAL | Status: DC | PRN
Start: 2015-09-20 — End: 2016-03-31

## 2015-09-20 NOTE — Patient Instructions (Signed)
Chest exam is clear today . If  persistent or progressive can get chest x ray  In the interim suspect aggravation  Of cough   With reflux .   don't use  Cough drops with oils or    Menthols.     Sugar free candy is ok   Can add tessalon perles .   Short run.   Add  Acid blocker  omeprezole 20 mg  once a day  For 2 weeks  And if not better need more evaluation.

## 2015-09-20 NOTE — Telephone Encounter (Signed)
[facility]  Patient Name: Jessica Chan  DOB: [birth year]    Initial Comment Caller states sick since Feb 3; began as sinus infection and cold; saw Georgina Snell Wed/ dx bronchitis; Rx'd prednisone; still has tightness in chest and burning in throat; especially when lying down; raspy voice, esp @ night; Cannot stop coughing;    Nurse Assessment  Nurse: Wayne Sever, RN, Tillie Rung Date/Time (Eastern Time): 09/20/2015 11:48:39 AM  Confirm and document reason for call. If symptomatic, describe symptoms. You must click the next button to save text entered. ---Caller states she was seen last Wednesday and it was just a bit better and is now sick again. She states she has a burning in her chest that gets worse when laying down. The cough and burning is still there. She is taking Mucinex DM, but came off for the past 3 days. She is still on Prednisone 20 mg. Denies any fever.  Has the patient traveled out of the country within the last 30 days? ---Not Applicable  Does the patient have any new or worsening symptoms? ---Yes  Will a triage be completed? ---Yes  Related visit to physician within the last 2 weeks? ---No  Does the PT have any chronic conditions? (i.e. diabetes, asthma, etc.) ---No  Is this a behavioral health or substance abuse call? ---No     Guidelines    Guideline Title Affirmed Question Affirmed Notes  Cough - Acute Productive SEVERE coughing spells (e.g., whooping sound after coughing, vomiting after coughing)    Final Disposition User   See Physician within [place], RN, Tillie Rung    Comments  Appointment scheduled with Dr Regis Bill, today, 02/27 at 3pm   Referrals  REFERRED TO PCP OFFICE   Disagree/Comply: Comply

## 2015-09-20 NOTE — Progress Notes (Signed)
Pre visit review using our clinic review tool, if applicable. No additional management support is needed unless otherwise documented below in the visit note.  Chief Complaint  Patient presents with  . Cough  . Sore Throat  . Fatigue  . Nasal Congestion    HPI: Patient Jessica Chan  comes in today for SDA for  new problem evaluation.  Onset  Feb 3rd    Cough uri   And never totally better   See last note  cory nafziger   rx with cough med and prednisone    Acid reflux sx   Choking  Like saliva     On prednisone and   Cough med at night  Sinuses.... better    Now .    Sinus infection.  Few days ago getting better  .   And now getting worse   Left   Throat  Sores  No food choking  At  [place]  Strangled at water .    Mother in law passed .   Still on it s  ROS: See pertinent positives and negatives per HPI. No hx of bronchitis and lung issues but mom did . Remote hx of  esoph  dilitation needed   Past Medical History  Diagnosis Date  . Allergic rhinitis   . GERD (gastroesophageal reflux disease)   . Hemangioma of liver     incidental finding-large, right and left lobes of liver   . Anxiety disorder   . Depression   . IBS (irritable bowel syndrome)   . Diverticulosis   . Hiatal hernia   . Sinusitis, acute maxillary 07/29/2012    right  hx of sinus surgery  fu if needed recurrecne    . BLEPHARITIS 05/27/2007    Qualifier: Diagnosis of  By: Hulan Saas, CMA (AAMA), Quita Skye   . Diverticulitis   . Diverticulitis of colon - recurrent 10/02/2013  . ESOPHAGEAL STRICTURE 11/15/2009    Qualifier: Diagnosis of  By: Trellis Paganini PA-c, Amy S   . PONV (postoperative nausea and vomiting)     SEVERE NAUSEA & VOMITING  . Anxiety     Family History  Problem Relation Age of Onset  . Lung cancer Mother   . Heart disease Father   . Autism Son   . Colon cancer Maternal Uncle   . Breast cancer Maternal Grandmother     Social History   Social History  . Marital Status: Married   Spouse Name: N/A  . Number of Children: 2  . Years of Education: N/A   Occupational History  . Caregiver    Social History Main Topics  . Smoking status: Never Smoker   . Smokeless tobacco: Never Used  . Alcohol Use: 0.0 oz/week    0 Standard drinks or equivalent per week     Comment: Social Drinker--SEVERAL DRINKS A WEEK  . Drug Use: No  . Sexual Activity: Yes   Other Topics Concern  . None   Social History Narrative   Married HHof 4 one sone autistic   Husband had prostate cancer in last few years   Some exercise  Runs    No tobacco    Outpatient Prescriptions Prior to Visit  Medication Sig Dispense Refill  . ALPRAZolam (XANAX) 0.25 MG tablet take 1 tablet by mouth if needed for travel. 30 tablet 0  . buPROPion (WELLBUTRIN XL) 150 MG 24 hr tablet TAKE 1 TABLET EVERY MORNING 90 tablet 2  . HYDROcodone-homatropine (HYCODAN) 5-1.5 MG/5ML syrup  Take 5 mLs by mouth every 8 (eight) hours as needed for cough. 120 mL 0  . Ketotifen Fumarate (THERA TEARS ALLERGY OP) Apply 2 drops to eye 2 (two) times daily.    . Multiple Vitamin (MULTIVITAMIN) tablet Take 1 tablet by mouth every morning.     Marland Kitchen OVER THE COUNTER MEDICATION Relizen    . predniSONE (DELTASONE) 10 MG tablet Take 1 tablet (10 mg total) by mouth daily with breakfast. 21 tablet 0  . tretinoin (RETIN-A) 0.025 % cream apply to affected area at bedtime TO DRY SKIN FOR ACNE  0  . zolpidem (AMBIEN) 10 MG tablet Take 1 tablet (10 mg total) by mouth at bedtime as needed for sleep. 30 tablet 2   No facility-administered medications prior to visit.     EXAM:  BP 112/74 mmHg  Pulse 83  Temp(Src) 99.1 F (37.3 C) (Oral)  Wt 115 lb 6.4 oz (52.345 kg)  SpO2 97%  LMP 07/25/2011  Body mass index is 19.8 kg/(m^2).  GENERAL: vitals reviewed and listed above, alert, oriented, appears well hydrated and in no acute distress mild hoarseness HEENT: atraumatic, conjunctiva  clear, no obvious abnormalities on inspection of external  nose and ears OP : no lesion edema or exudate  Face non tendern nose nl  NECK: no obvious masses on inspection palpation  LUNGS: clear to auscultation bilaterally, no wheezes, rales or rhonchi, good air movement points to mid upper chest as area of discomfort and left side of throat near trachea CV: HRRR, no clubbing cyanosis or  peripheral edema nl cap refill  MS: moves all extremities without noticeable focal  abnormality PSYCH: pleasant and cooperative, no obvious depression mild  anxiety   ASSESSMENT AND PLAN:  Discussed the following assessment and plan:  Cough, persistent - Plan: DG Chest 2 View  Gastroesophageal reflux disease, esophagitis presence not specified Improved nasal and sinus sx  But cough  Poss ua irritabiity and reflux    Onset  When getting sick and  Chocked on water  .     Add ppi pt wants to delay x ray if possible  If needed consider gi or ent check  Had cns se of  muc dm   At end of prednisone -Patient advised to return or notify health care team  if symptoms worsen ,persist or new concerns arise.  Patient Instructions  Chest exam is clear today . If  persistent or progressive can get chest x ray  In the interim suspect aggravation  Of cough   With reflux .   don't use  Cough drops with oils or    Menthols.     Sugar free candy is ok   Can add tessalon perles .   Short run.   Add  Acid blocker  omeprezole 20 mg  once a day  For 2 weeks  And if not better need more evaluation.         Standley Brooking. Shenice Dolder M.D.

## 2015-10-21 ENCOUNTER — Ambulatory Visit (INDEPENDENT_AMBULATORY_CARE_PROVIDER_SITE_OTHER)
Admission: RE | Admit: 2015-10-21 | Discharge: 2015-10-21 | Disposition: A | Discharge status: Home / Self Care | Payer: BLUE CROSS/BLUE SHIELD | Source: Ambulatory Visit | Attending: Family Medicine | Admitting: Family Medicine

## 2015-10-21 ENCOUNTER — Encounter: Payer: Self-pay | Admitting: Family Medicine

## 2015-10-21 ENCOUNTER — Ambulatory Visit (INDEPENDENT_AMBULATORY_CARE_PROVIDER_SITE_OTHER): Payer: BLUE CROSS/BLUE SHIELD | Admitting: Family Medicine

## 2015-10-21 VITALS — BP 120/80 | HR 96 | Temp 98.6°F | Ht 64.0 in | Wt 114.7 lb

## 2015-10-21 DIAGNOSIS — R059 Cough, unspecified: Secondary | ICD-10-CM

## 2015-10-21 DIAGNOSIS — R05 Cough: Secondary | ICD-10-CM

## 2015-10-21 DIAGNOSIS — J988 Other specified respiratory disorders: Secondary | ICD-10-CM

## 2015-10-21 NOTE — Patient Instructions (Signed)
INSTRUCTIONS FOR UPPER RESPIRATORY INFECTION:  -go get the chest exay  -nasal saline wash 2-3 times daily (use prepackaged nasal saline or bottled/distilled water if making your own)   -AFRIN nasal spray twice daily for 5 days, then must stop for 5 - 7 days.  -can use tylenol (in no history of liver disease) or ibuprofen (if no history of kidney disease, bowel bleeding or significant heart disease) as directed for aches and sorethroat  -in the winter time, using a humidifier at night is helpful (please follow cleaning instructions)  -if you are taking a cough medication - use only as directed, may also try a teaspoon of honey to coat the throat and throat lozenges. If given a cough medication with codeine or hydrocodone or other narcotic please be advised that this contains a strong and  potentially addicting medication. Please follow instructions carefully, take as little as possible and only use AS NEEDED for severe cough. Discuss potential side effects with your pharmacy. Please do not drive or operate machinery while taking these types of medications. Please do not take other sedating medications, drugs or alcohol while taking this medication without discussing with your doctor.  -for sore throat, salt water gargles can help  -follow up if you have fevers, facial pain, tooth pain, difficulty breathing or are worsening or symptoms persist longer then expected  Upper Respiratory Infection, Adult An upper respiratory infection (URI) is also known as the common cold. It is often caused by a type of germ (virus). Colds are easily spread (contagious). You can pass it to others by kissing, coughing, sneezing, or drinking out of the same glass. Usually, you get better in 1 to 3  weeks.  However, the cough can last for even longer. HOME CARE   Only take medicine as told by your doctor. Follow instructions provided above.  Drink enough water and fluids to keep your pee (urine) clear or pale  yellow.  Get plenty of rest.  Return to work when your temperature is < 100 for 24 hours or as told by your doctor. You may use a face mask and wash your hands to stop your cold from spreading. GET HELP RIGHT AWAY IF:   After the first few days, you feel you are getting worse.  You have questions about your medicine.  You have chills, shortness of breath, or red spit (mucus).  You have pain in the face for more then 1-2 days, especially when you bend forward.  You have a fever, puffy (swollen) neck, pain when you swallow, or white spots in the back of your throat.  You have a bad headache, ear pain, sinus pain, or chest pain.  You have a high-pitched whistling sound when you breathe in and out (wheezing).  You cough up blood.  You have sore muscles or a stiff neck. MAKE SURE YOU:   Understand these instructions.  Will watch your condition.  Will get help right away if you are not doing well or get worse. Document Released: 12/27/2007 Document Revised: 10/02/2011 Document Reviewed: 10/15/2013 [facility] Patient Information 2015 [place], [place]. This information is not intended to replace advice given to you by your health care provider. Make sure you discuss any questions you have with your health care provider.

## 2015-10-21 NOTE — Progress Notes (Signed)
HPI:  URI: -started: 4 days ago -symptoms:nasal congestion, sore throat, cough productive of thick discolored mucus, no hemoptysis, she feels she has had a few wheezes in the "R lung" -denies:fever, SOB, NVD, body aches, tooth pain -has tried: cough drops -sick contacts/travel/risks: no reported flu, strep or tick exposure -Hx of: allergies, hx resp illness last month, resolved and felt "great" for 2 weeks, then this started Monday -She did a video visit with a provider through her husband's work and was given amoxicillin which she started several days ago, but this has not helped  ROS: See pertinent positives and negatives per HPI.  Past Medical History  Diagnosis Date  . Allergic rhinitis   . GERD (gastroesophageal reflux disease)   . Hemangioma of liver     incidental finding-large, right and left lobes of liver   . Anxiety disorder   . Depression   . IBS (irritable bowel syndrome)   . Diverticulosis   . Hiatal hernia   . Sinusitis, acute maxillary 07/29/2012    right  hx of sinus surgery  fu if needed recurrecne    . BLEPHARITIS 05/27/2007    Qualifier: Diagnosis of  By: Hulan Saas, CMA (AAMA), Quita Skye   . Diverticulitis   . Diverticulitis of colon - recurrent 10/02/2013  . ESOPHAGEAL STRICTURE 11/15/2009    Qualifier: Diagnosis of  By: Trellis Paganini PA-c, Amy S   . PONV (postoperative nausea and vomiting)     SEVERE NAUSEA & VOMITING  . Anxiety     Past Surgical History  Procedure Laterality Date  . Nasal sinus surgery    . Ulnar nerve right arm    . Tonsillectomy    . Adnoidectomy    . Endoscopy with dialation    . Colonoscopy    . Upper gastrointestinal endoscopy    . Proctoscopy N/A 07/02/2014    Procedure: PROCTOSCOPY;  Surgeon: Michael Boston, MD;  Location: [facility];  Service: General;  Laterality: N/A;  . Robot assisted laparoscopic partial colectomy      Family History  Problem Relation Age of Onset  . Lung cancer Mother   . Heart disease Father   . Autism  Son   . Colon cancer Maternal Uncle   . Breast cancer Maternal Grandmother     Social History   Social History  . Marital Status: Married    Spouse Name: N/A  . Number of Children: 2  . Years of Education: N/A   Occupational History  . Caregiver    Social History Main Topics  . Smoking status: Never Smoker   . Smokeless tobacco: Never Used  . Alcohol Use: 0.0 oz/week    0 Standard drinks or equivalent per week     Comment: Social Drinker--SEVERAL DRINKS A WEEK  . Drug Use: No  . Sexual Activity: Yes   Other Topics Concern  . None   Social History Narrative   Married HHof 4 one sone autistic   Husband had prostate cancer in last few years   Some exercise  Runs    No tobacco     Current outpatient prescriptions:  .  ALPRAZolam (XANAX) 0.25 MG tablet, take 1 tablet by mouth if needed for travel., Disp: 30 tablet, Rfl: 0 .  amoxicillin (AMOXIL) 500 MG capsule, , Disp: , Rfl:  .  benzonatate (TESSALON) 200 MG capsule, Take 1 capsule (200 mg total) by mouth 3 (three) times daily as needed for cough., Disp: 24 capsule, Rfl: 1 .  buPROPion (WELLBUTRIN XL)  150 MG 24 hr tablet, TAKE 1 TABLET EVERY MORNING, Disp: 90 tablet, Rfl: 2 .  HYDROcodone-homatropine (HYCODAN) 5-1.5 MG/5ML syrup, Take 5 mLs by mouth every 8 (eight) hours as needed for cough., Disp: 120 mL, Rfl: 0 .  Ketotifen Fumarate (THERA TEARS ALLERGY OP), Apply 2 drops to eye 2 (two) times daily., Disp: , Rfl:  .  Multiple Vitamin (MULTIVITAMIN) tablet, Take 1 tablet by mouth every morning. , Disp: , Rfl:  .  OVER THE COUNTER MEDICATION, Relizen, Disp: , Rfl:  .  predniSONE (DELTASONE) 10 MG tablet, Take 1 tablet (10 mg total) by mouth daily with breakfast., Disp: 21 tablet, Rfl: 0 .  tretinoin (RETIN-A) 0.025 % cream, apply to affected area at bedtime TO DRY SKIN FOR ACNE, Disp: , Rfl: 0 .  zolpidem (AMBIEN) 10 MG tablet, Take 1 tablet (10 mg total) by mouth at bedtime as needed for sleep., Disp: 30 tablet, Rfl:  2  EXAM:  Filed Vitals:   10/21/15 1538  BP: 120/80  Pulse: 96  Temp: 98.6 F (37 C)    Body mass index is 19.68 kg/(m^2).  GENERAL: vitals reviewed and listed above, alert, oriented, appears well hydrated and in no acute distress  HEENT: atraumatic, conjunttiva clear, no obvious abnormalities on inspection of external nose and ears, normal appearance of ear canals and TMs, thick nasal congestion, mild post oropharyngeal erythema with PND, no tonsillar edema or exudate, no sinus TTP  NECK: no obvious masses on inspection  LUNGS: clear to auscultation bilaterally, no wheezes, rales or rhonchi, good air movement  CV: HRRR, no peripheral edema  MS: moves all extremities without noticeable abnormality  PSYCH: pleasant and cooperative, no obvious depression or anxiety  ASSESSMENT AND PLAN:  Discussed the following assessment and plan:  Respiratory infection - Plan: DG Chest 2 View  Cough - Plan: DG Chest 2 View  -We discussed potential etiologies, with VURI being most likely given this started 4 days ago. She is concerned that this is a continuation of the illness last month, but she did have 2 weeks symptom free prior to these symptoms, so I explained that this is more likely a new respiratory infection. She also is concerned about the wheeze in her lung. To address these concerns and we will obtain a chest x-ray. We discussed treatment side effects, likely course, antibiotic misuse, transmission, and signs of developing a serious illness. Advised, she could stop the amoxicillin. Would attempt supportive and symptomatic care for a likely viral upper respiratory infection as long as the chest x-ray is clear. She has a cough syrup at home from her last visit, and declines a cough medication today. Discussed signs and symptoms of a worsening infection or sinusitis.  -of course, we advised to return or notify a doctor immediately if symptoms worsen or persist or new concerns  arise.    Patient Instructions  INSTRUCTIONS FOR UPPER RESPIRATORY INFECTION:  -go get the chest exay  -nasal saline wash 2-3 times daily (use prepackaged nasal saline or bottled/distilled water if making your own)   -AFRIN nasal spray twice daily for 5 days, then must stop for 5 - 7 days.  -can use tylenol (in no history of liver disease) or ibuprofen (if no history of kidney disease, bowel bleeding or significant heart disease) as directed for aches and sorethroat  -in the winter time, using a humidifier at night is helpful (please follow cleaning instructions)  -if you are taking a cough medication - use only as directed,  may also try a teaspoon of honey to coat the throat and throat lozenges. If given a cough medication with codeine or hydrocodone or other narcotic please be advised that this contains a strong and  potentially addicting medication. Please follow instructions carefully, take as little as possible and only use AS NEEDED for severe cough. Discuss potential side effects with your pharmacy. Please do not drive or operate machinery while taking these types of medications. Please do not take other sedating medications, drugs or alcohol while taking this medication without discussing with your doctor.  -for sore throat, salt water gargles can help  -follow up if you have fevers, facial pain, tooth pain, difficulty breathing or are worsening or symptoms persist longer then expected  Upper Respiratory Infection, Adult An upper respiratory infection (URI) is also known as the common cold. It is often caused by a type of germ (virus). Colds are easily spread (contagious). You can pass it to others by kissing, coughing, sneezing, or drinking out of the same glass. Usually, you get better in 1 to 3  weeks.  However, the cough can last for even longer. HOME CARE   Only take medicine as told by your doctor. Follow instructions provided above.  Drink enough water and fluids to keep  your pee (urine) clear or pale yellow.  Get plenty of rest.  Return to work when your temperature is < 100 for 24 hours or as told by your doctor. You may use a face mask and wash your hands to stop your cold from spreading. GET HELP RIGHT AWAY IF:   After the first few days, you feel you are getting worse.  You have questions about your medicine.  You have chills, shortness of breath, or red spit (mucus).  You have pain in the face for more then 1-2 days, especially when you bend forward.  You have a fever, puffy (swollen) neck, pain when you swallow, or white spots in the back of your throat.  You have a bad headache, ear pain, sinus pain, or chest pain.  You have a high-pitched whistling sound when you breathe in and out (wheezing).  You cough up blood.  You have sore muscles or a stiff neck. MAKE SURE YOU:   Understand these instructions.  Will watch your condition.  Will get help right away if you are not doing well or get worse. Document Released: 12/27/2007 Document Revised: 10/02/2011 Document Reviewed: 10/15/2013 [facility] Patient Information 2015 [place], [place]. This information is not intended to replace advice given to you by your health care provider. Make sure you discuss any questions you have with your health care provider.      Colin Benton R.

## 2015-10-21 NOTE — Progress Notes (Signed)
Pre visit review using our clinic review tool, if applicable. No additional management support is needed unless otherwise documented below in the visit note. 

## 2016-03-31 ENCOUNTER — Encounter: Payer: Self-pay | Admitting: Gynecology

## 2016-03-31 ENCOUNTER — Ambulatory Visit (INDEPENDENT_AMBULATORY_CARE_PROVIDER_SITE_OTHER): Payer: BLUE CROSS/BLUE SHIELD | Admitting: Gynecology

## 2016-03-31 VITALS — BP 112/68 | Ht 64.5 in | Wt 113.0 lb

## 2016-03-31 DIAGNOSIS — R102 Pelvic and perineal pain: Secondary | ICD-10-CM

## 2016-03-31 DIAGNOSIS — G4700 Insomnia, unspecified: Secondary | ICD-10-CM | POA: Diagnosis not present

## 2016-03-31 DIAGNOSIS — Z01419 Encounter for gynecological examination (general) (routine) without abnormal findings: Secondary | ICD-10-CM | POA: Diagnosis not present

## 2016-03-31 MED ORDER — ALPRAZOLAM 0.25 MG PO TABS: ORAL_TABLET | ORAL | 0 refills | Status: DC

## 2016-03-31 NOTE — Patient Instructions (Signed)
Insomnia Insomnia is a sleep disorder that makes it difficult to fall asleep or to stay asleep. Insomnia can cause tiredness (fatigue), low energy, difficulty concentrating, mood swings, and poor performance at work or school.  There are three different ways to classify insomnia:  Difficulty falling asleep.  Difficulty staying asleep.  Waking up too early in the morning. Any type of insomnia can be long-term (chronic) or short-term (acute). Both are common. Short-term insomnia usually lasts for three months or less. Chronic insomnia occurs at least three times a week for longer than three months. CAUSES  Insomnia may be caused by another condition, situation, or substance, such as:  Anxiety.  Certain medicines.  Gastroesophageal reflux disease (GERD) or other gastrointestinal conditions.  Asthma or other breathing conditions.  Restless legs syndrome, sleep apnea, or other sleep disorders.  Chronic pain.  Menopause. This may include hot flashes.  Stroke.  Abuse of alcohol, tobacco, or illegal drugs.  Depression.  Caffeine.   Neurological disorders, such as Alzheimer disease.  An overactive thyroid (hyperthyroidism). The cause of insomnia may not be known. RISK FACTORS Risk factors for insomnia include:  Gender. Women are more commonly affected than men.  Age. Insomnia is more common as you get older.  Stress. This may involve your professional or personal life.  Income. Insomnia is more common in people with lower income.  Lack of exercise.   Irregular work schedule or night shifts.  Traveling between different time zones. SIGNS AND SYMPTOMS If you have insomnia, trouble falling asleep or trouble staying asleep is the main symptom. This may lead to other symptoms, such as:  Feeling fatigued.  Feeling nervous about going to sleep.  Not feeling rested in the morning.  Having trouble concentrating.  Feeling irritable, anxious, or depressed. TREATMENT   Treatment for insomnia depends on the cause. If your insomnia is caused by an underlying condition, treatment will focus on addressing the condition. Treatment may also include:   Medicines to help you sleep.  Counseling or therapy.  Lifestyle adjustments. HOME CARE INSTRUCTIONS   Take medicines only as directed by your health care provider.  Keep regular sleeping and waking hours. Avoid naps.  Keep a sleep diary to help you and your health care provider figure out what could be causing your insomnia. Include:   When you sleep.  When you wake up during the night.  How well you sleep.   How rested you feel the next day.  Any side effects of medicines you are taking.  What you eat and drink.   Make your bedroom a comfortable place where it is easy to fall asleep:  Put up shades or special blackout curtains to block light from outside.  Use a white noise machine to block noise.  Keep the temperature cool.   Exercise regularly as directed by your health care provider. Avoid exercising right before bedtime.  Use relaxation techniques to manage stress. Ask your health care provider to suggest some techniques that may work well for you. These may include:  Breathing exercises.  Routines to release muscle tension.  Visualizing peaceful scenes.  Cut back on alcohol, caffeinated beverages, and cigarettes, especially close to bedtime. These can disrupt your sleep.  Do not overeat or eat spicy foods right before bedtime. This can lead to digestive discomfort that can make it hard for you to sleep.  Limit screen use before bedtime. This includes:  Watching TV.  Using your smartphone, tablet, and computer.  Stick to a routine. This   can help you fall asleep faster. Try to do a quiet activity, brush your teeth, and go to bed at the same time each night.  Get out of bed if you are still awake after 15 minutes of trying to sleep. Keep the lights down, but try reading or  doing a quiet activity. When you feel sleepy, go back to bed.  Make sure that you drive carefully. Avoid driving if you feel very sleepy.  Keep all follow-up appointments as directed by your health care provider. This is important. SEEK MEDICAL CARE IF:   You are tired throughout the day or have trouble in your daily routine due to sleepiness.  You continue to have sleep problems or your sleep problems get worse. SEEK IMMEDIATE MEDICAL CARE IF:   You have serious thoughts about hurting yourself or someone else.   This information is not intended to replace advice given to you by your health care provider. Make sure you discuss any questions you have with your health care provider.   Document Released: 07/07/2000 Document Revised: 03/31/2015 Document Reviewed: 04/10/2014 [employer] Interactive Patient Education 2016 [employer]  

## 2016-03-31 NOTE — Progress Notes (Signed)
Jessica Chan 1960/12/08 [id number]   History:    55 y.o.  for annual gyn exam with a complaint of several months of on and off left lower quadrant discomfort. Patient with history of IBS and past history of colon resection as a result of diverticulitis back in 2015. She reports no change in bowel movements recently or any blood in her stools. Dr. Regis Bill is her PCP has been doing her blood work. She will be getting her flu vaccine at work in the next few days. Patient has had history of insomnia in the past and rarely takes Xanax only on a when necessary basis 0.25 mg. Patient for vasomotor symptoms and has done well with  Relizen which is a natural nonhormonal nutritional supplement for relief of hot flashes associated with the menopause. She takes one tablet twice a day. She also uses peppermint oil   which she applies two dabs behind each ear 2-3 times a day when necessary. Also for dyspareunia during intercourse she uses Astro glide. This combination has worked well for her. She hasn't started taking her calcium and vitamin D for osteoporosis prevention. She had a normal bone density study in 2016. Patient with no past history of any abnormal Pap smear and her last colonoscopy in 2011 reported to be normal.  Past medical history,surgical history, family history and social history were all reviewed and documented in the EPIC chart.  Gynecologic History Patient's last menstrual period was 07/25/2011. Contraception: post menopausal status Last Pap: 2016. Results were: normal Last mammogram: 2016. Results were: normal  Obstetric History OB History  Gravida Para Term Preterm AB Living  2 2       2   SAB TAB Ectopic Multiple Live Births               # Outcome Date GA Lbr Len/2nd Weight Sex Delivery Anes PTL Lv  2 Para     M Vag-Spont     1 Para     M Vag-Spont          ROS: A ROS was performed and pertinent positives and negatives are included in the history.  GENERAL: No fevers or  chills. HEENT: No change in vision, no earache, sore throat or sinus congestion. NECK: No pain or stiffness. CARDIOVASCULAR: No chest pain or pressure. No palpitations. PULMONARY: No shortness of breath, cough or wheeze. GASTROINTESTINAL: No abdominal pain, nausea, vomiting or diarrhea, melena or bright red blood per rectum. GENITOURINARY: No urinary frequency, urgency, hesitancy or dysuria. MUSCULOSKELETAL: No joint or muscle pain, no back pain, no recent trauma. DERMATOLOGIC: No rash, no itching, no lesions. ENDOCRINE: No polyuria, polydipsia, no heat or cold intolerance. No recent change in weight. HEMATOLOGICAL: No anemia or easy bruising or bleeding. NEUROLOGIC: No headache, seizures, numbness, tingling or weakness. PSYCHIATRIC: No depression, no loss of interest in normal activity or change in sleep pattern.     Exam: chaperone present  BP 112/68   Ht 5' 4.5" (1.638 m)   Wt 113 lb (51.3 kg)   LMP 07/25/2011   BMI 19.10 kg/m   Body mass index is 19.1 kg/m.  General appearance : Well developed well nourished female. No acute distress HEENT: Eyes: no retinal hemorrhage or exudates,  Neck supple, trachea midline, no carotid bruits, no thyroidmegaly Lungs: Clear to auscultation, no rhonchi or wheezes, or rib retractions  Heart: Regular rate and rhythm, no murmurs or gallops Breast:Examined in sitting and supine position were symmetrical in appearance, no palpable masses  or tenderness,  no skin retraction, no nipple inversion, no nipple discharge, no skin discoloration, no axillary or supraclavicular lymphadenopathy Abdomen: no palpable masses or tenderness, no rebound or guarding Extremities: no edema or skin discoloration or tenderness  Pelvic:  Bartholin, Urethra, Skene Glands: Within normal limits             Vagina: No gross lesions or discharge, atrophic changes  Cervix: No gross lesions or discharge  Uterus  anteverted, normal size, shape and consistency, non-tender and  mobile  Adnexa  Without masses or tenderness  Anus and perineum  normal   Rectovaginal  normal sphincter tone without palpated masses or tenderness             Hemoccult PCP provides     Assessment/Plan:  55 y.o. female for annual exam with left lower quadrant abdominal pains on and off. We'll schedule an ultrasound next week and if the ultrasound is normal she will follow-up with her gastroenterologist because her past history of IBS and colon resection. Her PCP is been doing her blood work and she'll be getting her flu vaccine at work next week. Pap smear not indicated this year. Patient scheduled for mammogram the next few weeks. We discussed importance of calcium vitamin D and weightbearing exercise for osteoporosis prevention. We also discussed importance of monthly breast exams.   Terrance Mass MD, 12:17 PM 03/31/2016

## 2016-04-03 DIAGNOSIS — Z1231 Encounter for screening mammogram for malignant neoplasm of breast: Secondary | ICD-10-CM | POA: Diagnosis not present

## 2016-04-03 DIAGNOSIS — Z803 Family history of malignant neoplasm of breast: Secondary | ICD-10-CM | POA: Diagnosis not present

## 2016-04-03 LAB — HM MAMMOGRAPHY

## 2016-04-04 ENCOUNTER — Encounter: Payer: Self-pay | Admitting: Family Medicine

## 2016-04-19 ENCOUNTER — Encounter: Payer: Self-pay | Admitting: Gynecology

## 2016-04-19 ENCOUNTER — Ambulatory Visit (INDEPENDENT_AMBULATORY_CARE_PROVIDER_SITE_OTHER): Payer: BLUE CROSS/BLUE SHIELD | Admitting: Gynecology

## 2016-04-19 ENCOUNTER — Other Ambulatory Visit: Payer: Self-pay | Admitting: Gynecology

## 2016-04-19 ENCOUNTER — Ambulatory Visit (INDEPENDENT_AMBULATORY_CARE_PROVIDER_SITE_OTHER): Payer: BLUE CROSS/BLUE SHIELD

## 2016-04-19 VITALS — BP 110/80 | Ht 64.0 in | Wt 113.0 lb

## 2016-04-19 DIAGNOSIS — R9389 Abnormal findings on diagnostic imaging of other specified body structures: Secondary | ICD-10-CM

## 2016-04-19 DIAGNOSIS — N9489 Other specified conditions associated with female genital organs and menstrual cycle: Secondary | ICD-10-CM

## 2016-04-19 DIAGNOSIS — G4700 Insomnia, unspecified: Secondary | ICD-10-CM

## 2016-04-19 DIAGNOSIS — R102 Pelvic and perineal pain unspecified side: Secondary | ICD-10-CM

## 2016-04-19 DIAGNOSIS — R938 Abnormal findings on diagnostic imaging of other specified body structures: Secondary | ICD-10-CM | POA: Diagnosis not present

## 2016-04-19 MED ORDER — ZOLPIDEM TARTRATE 10 MG PO TABS
10.0000 mg | ORAL_TABLET | Freq: Every evening | ORAL | 0 refills | Status: DC | PRN
Start: 2016-04-19 — End: 2018-06-27

## 2016-04-19 NOTE — Progress Notes (Signed)
   Patient is a 55 year old that presented to the office today for her ultrasound. She was seen the office for her annual exam was September 8 and had been complaining low abdominal pains on and off. Patient does have history of irritable bowel syndrome has had prior history of colon resection I've recommended she follow-up with her gastroenterologist.She reports no change in bowel movements recently or any blood in her stools. Dr. Regis Bill is her [place] which is a natural nonhormonal nutritional supplement for relief of hot flashes associated with the menopause. She takes one tablet twice a day. She also uses peppermint oil  which she applies two dabs behind each ear 2-3 times a day when necessary. P has been doing her blood work. She will be getting her flu vaccine at work in the next few days. Patient has had history of insomnia and wanted a prescription for Ambien to take on those days when she had difficulty sleeping especially since she's going on a trip. Patient reports no vaginal bleeding and has been doing well with it.  Ultrasound today: Uterus measured 5.0 x 4.4 x 2.8 cm with endometrial stripe of 6.1 mm. Thickened endometrium with a cystic solid focus measuring 7 x 6 mm rounded by fluid was noted. Right ovary and left ovary were otherwise normal no fluid in the cul-de-sac.  Assessment/plan: #1 patient with history of insomnia will be prescribed Ambien 10 mg take 1 by mouth daily at bedtime when necessary #30 #2 lower abdominal pains probably associated with IBS although ultrasound today demonstrating a questionable endometrial polyp for which patient will return back to the office next week for sonohysterogram.  Time of consultation 15 minutes

## 2016-04-19 NOTE — Patient Instructions (Signed)
Zolpidem tablets What is this medicine? ZOLPIDEM (zole PI dem) is used to treat insomnia. This medicine helps you to fall asleep and sleep through the night. This medicine may be used for other purposes; ask your health care provider or pharmacist if you have questions. What should I tell my health care provider before I take this medicine? They need to know if you have any of these conditions: -depression -history of drug abuse or addiction -if you often drink alcohol -liver disease -lung or breathing disease -myasthenia gravis -sleep apnea -suicidal thoughts, plans, or attempt; a previous suicide attempt by you or a family member -an unusual or allergic reaction to zolpidem, other medicines, foods, dyes, or preservatives -pregnant or trying to get pregnant -breast-feeding How should I use this medicine? Take this medicine by mouth with a glass of water. Follow the directions on the prescription label. It is better to take this medicine on an empty stomach and only when you are ready for bed. Do not take your medicine more often than directed. If you have been taking this medicine for several weeks and suddenly stop taking it, you may get unpleasant withdrawal symptoms. Your doctor or health care professional may want to gradually reduce the dose. Do not stop taking this medicine on your own. Always follow your doctor or health care professional's advice. A special MedGuide will be given to you by the pharmacist with each prescription and refill. Be sure to read this information carefully each time. Talk to your pediatrician regarding the use of this medicine in children. Special care may be needed. Overdosage: If you think you have taken too much of this medicine contact a poison control center or emergency room at once. NOTE: This medicine is only for you. Do not share this medicine with others. What if I miss a dose? This does not apply. This medicine should only be taken immediately  before going to sleep. Do not take double or extra doses. What may interact with this medicine? -alcohol -antihistamines for allergy, cough and cold -certain medicines for anxiety or sleep -certain medicines for depression, like amitriptyline, fluoxetine, sertraline -certain medicines for fungal infections like ketoconazole and itraconazole -certain medicines for seizures like phenobarbital, primidone -ciprofloxacin -dietary supplements for sleep, like valerian or kava kava -general anesthetics like halothane, isoflurane, methoxyflurane, propofol -local anesthetics like lidocaine, pramoxine, tetracaine -medicines that relax muscles for surgery -narcotic medicines for pain -phenothiazines like chlorpromazine, mesoridazine, prochlorperazine, thioridazine -rifampin This list may not describe all possible interactions. Give your health care provider a list of all the medicines, herbs, non-prescription drugs, or dietary supplements you use. Also tell them if you smoke, drink alcohol, or use illegal drugs. Some items may interact with your medicine. What should I watch for while using this medicine? Visit your doctor or health care professional for regular checks on your progress. Keep a regular sleep schedule by going to bed at about the same time each night. Avoid caffeine-containing drinks in the evening hours. When sleep medicines are used every night for more than a few weeks, they may stop working. Talk to your doctor if you still have trouble sleeping. After taking this medicine for sleep, you may get up out of bed while not being fully awake and do an activity that you do not know you are doing. The next morning, you may have no memory of the event. Activities such as driving a car ("sleep-driving"), making and eating food, talking on the phone, sexual activity, and sleep-walking have been  reported. Call your doctor right away if you find out you have done any of these activities. Do not take  this medicine if you have used alcohol that evening or before bed or taken another medicine for sleep since your risk of doing these sleep-related activities will be increased. Wait for at least 8 hours after you take a dose before driving or doing other activities that require full mental alertness. Do not take this medicine unless you are able to stay in bed for a full night (7 to 8 hours) before you must be active again. You may have a decrease in mental alertness the day after use, even if you feel that you are fully awake. Tell your doctor if you will need to perform activities requiring full alertness, such as driving, the next day. Do not stand or sit up quickly after taking this medicine, especially if you are an older patient. This reduces the risk of dizzy or fainting spells. If you or your family notice any changes in your behavior, such as new or worsening depression, thoughts of harming yourself, anxiety, other unusual or disturbing thoughts, or memory loss, call your doctor right away. After you stop taking this medicine, you may have trouble falling asleep. This is called rebound insomnia. This problem usually goes away on its own after 1 or 2 nights. What side effects may I notice from receiving this medicine? Side effects that you should report to your doctor or health care professional as soon as possible: -allergic reactions like skin rash, itching or hives, swelling of the face, lips, or tongue -breathing problems -changes in vision -confusion -depressed mood or other changes in moods or emotions -feeling faint or lightheaded, falls -hallucinations -loss of balance or coordination -loss of memory -restlessness, excitability, or feelings of anxiety or agitation -suicidal thoughts -unusual activities while asleep like driving, eating, making phone calls, or sexual activity Side effects that usually do not require medical attention (report to your doctor or health care professional  if they continue or are bothersome): -dizziness -drowsiness the day after you take this medicine -headache This list may not describe all possible side effects. Call your doctor for medical advice about side effects. You may report side effects to FDA at 1-800-FDA-1088. Where should I keep my medicine? Keep out of the reach of children. This medicine can be abused. Keep your medicine in a safe place to protect it from theft. Do not share this medicine with anyone. Selling or giving away this medicine is dangerous and against the law. This medicine may cause accidental overdose and death if taken by other adults, children, or pets. Mix any unused medicine with a substance like cat litter or coffee grounds. Then throw the medicine away in a sealed container like a sealed bag or a coffee can with a lid. Do not use the medicine after the expiration date. Store at room temperature between 20 and 25 degrees C (68 and 77 degrees F). NOTE: This sheet is a summary. It may not cover all possible information. If you have questions about this medicine, talk to your doctor, pharmacist, or health care provider.    2016, Elsevier/Gold Standard. (2015-03-15 17:53:29) Insomnia Insomnia is a sleep disorder that makes it difficult to fall asleep or to stay asleep. Insomnia can cause tiredness (fatigue), low energy, difficulty concentrating, mood swings, and poor performance at work or school.  There are three different ways to classify insomnia:  Difficulty falling asleep.  Difficulty staying asleep.  Waking up  too early in the morning. Any type of insomnia can be long-term (chronic) or short-term (acute). Both are common. Short-term insomnia usually lasts for three months or less. Chronic insomnia occurs at least three times a week for longer than three months. CAUSES  Insomnia may be caused by another condition, situation, or substance, such as:  Anxiety.  Certain medicines.  Gastroesophageal reflux  disease (GERD) or other gastrointestinal conditions.  Asthma or other breathing conditions.  Restless legs syndrome, sleep apnea, or other sleep disorders.  Chronic pain.  Menopause. This may include hot flashes.  Stroke.  Abuse of alcohol, tobacco, or illegal drugs.  Depression.  Caffeine.   Neurological disorders, such as Alzheimer disease.  An overactive thyroid (hyperthyroidism). The cause of insomnia may not be known. RISK FACTORS Risk factors for insomnia include:  Gender. Women are more commonly affected than men.  Age. Insomnia is more common as you get older.  Stress. This may involve your professional or personal life.  Income. Insomnia is more common in people with lower income.  Lack of exercise.   Irregular work schedule or night shifts.  Traveling between different time zones. SIGNS AND SYMPTOMS If you have insomnia, trouble falling asleep or trouble staying asleep is the main symptom. This may lead to other symptoms, such as:  Feeling fatigued.  Feeling nervous about going to sleep.  Not feeling rested in the morning.  Having trouble concentrating.  Feeling irritable, anxious, or depressed. TREATMENT  Treatment for insomnia depends on the cause. If your insomnia is caused by an underlying condition, treatment will focus on addressing the condition. Treatment may also include:   Medicines to help you sleep.  Counseling or therapy.  Lifestyle adjustments. HOME CARE INSTRUCTIONS   Take medicines only as directed by your health care provider.  Keep regular sleeping and waking hours. Avoid naps.  Keep a sleep diary to help you and your health care provider figure out what could be causing your insomnia. Include:   When you sleep.  When you wake up during the night.  How well you sleep.   How rested you feel the next day.  Any side effects of medicines you are taking.  What you eat and drink.   Make your bedroom a comfortable  place where it is easy to fall asleep:  Put up shades or special blackout curtains to block light from outside.  Use a white noise machine to block noise.  Keep the temperature cool.   Exercise regularly as directed by your health care provider. Avoid exercising right before bedtime.  Use relaxation techniques to manage stress. Ask your health care provider to suggest some techniques that may work well for you. These may include:  Breathing exercises.  Routines to release muscle tension.  Visualizing peaceful scenes.  Cut back on alcohol, caffeinated beverages, and cigarettes, especially close to bedtime. These can disrupt your sleep.  Do not overeat or eat spicy foods right before bedtime. This can lead to digestive discomfort that can make it hard for you to sleep.  Limit screen use before bedtime. This includes:  Watching TV.  Using your smartphone, tablet, and computer.  Stick to a routine. This can help you fall asleep faster. Try to do a quiet activity, brush your teeth, and go to bed at the same time each night.  Get out of bed if you are still awake after 15 minutes of trying to sleep. Keep the lights down, but try reading or doing a quiet activity.  When you feel sleepy, go back to bed.  Make sure that you drive carefully. Avoid driving if you feel very sleepy.  Keep all follow-up appointments as directed by your health care provider. This is important. SEEK MEDICAL CARE IF:   You are tired throughout the day or have trouble in your daily routine due to sleepiness.  You continue to have sleep problems or your sleep problems get worse. SEEK IMMEDIATE MEDICAL CARE IF:   You have serious thoughts about hurting yourself or someone else.   This information is not intended to replace advice given to you by your health care provider. Make sure you discuss any questions you have with your health care provider.   Document Released: 07/07/2000 Document Revised: 03/31/2015  Document Reviewed: 04/10/2014 Elsevier Interactive Patient Education [employer].

## 2016-05-03 ENCOUNTER — Ambulatory Visit (INDEPENDENT_AMBULATORY_CARE_PROVIDER_SITE_OTHER): Payer: BLUE CROSS/BLUE SHIELD

## 2016-05-03 ENCOUNTER — Ambulatory Visit (INDEPENDENT_AMBULATORY_CARE_PROVIDER_SITE_OTHER): Payer: BLUE CROSS/BLUE SHIELD | Admitting: Gynecology

## 2016-05-03 ENCOUNTER — Other Ambulatory Visit: Payer: Self-pay | Admitting: Gynecology

## 2016-05-03 DIAGNOSIS — N840 Polyp of corpus uteri: Secondary | ICD-10-CM

## 2016-05-03 DIAGNOSIS — Z124 Encounter for screening for malignant neoplasm of cervix: Secondary | ICD-10-CM

## 2016-05-03 DIAGNOSIS — R938 Abnormal findings on diagnostic imaging of other specified body structures: Secondary | ICD-10-CM | POA: Diagnosis not present

## 2016-05-03 DIAGNOSIS — R9389 Abnormal findings on diagnostic imaging of other specified body structures: Secondary | ICD-10-CM

## 2016-05-03 DIAGNOSIS — N9489 Other specified conditions associated with female genital organs and menstrual cycle: Secondary | ICD-10-CM

## 2016-05-03 DIAGNOSIS — R8789 Other abnormal findings in specimens from female genital organs: Secondary | ICD-10-CM

## 2016-05-03 DIAGNOSIS — R87618 Other abnormal cytological findings on specimens from cervix uteri: Secondary | ICD-10-CM

## 2016-05-03 NOTE — Patient Instructions (Signed)
Hysteroscopy °Hysteroscopy is a procedure used for looking inside the womb (uterus). It may be done for various reasons, including: °· To evaluate abnormal bleeding, fibroid (benign, noncancerous) tumors, polyps, scar tissue (adhesions), and possibly cancer of the uterus. °· To look for lumps (tumors) and other uterine growths. °· To look for causes of why a woman cannot get pregnant (infertility), causes of recurrent loss of pregnancy (miscarriages), or a lost intrauterine device (IUD). °· To perform a sterilization by blocking the fallopian tubes from inside the uterus. °In this procedure, a thin, flexible tube with a tiny light and camera on the end of it (hysteroscope) is used to look inside the uterus. A hysteroscopy should be done right after a menstrual period to be sure you are not pregnant. °LET YOUR HEALTH CARE PROVIDER KNOW ABOUT:  °· Any allergies you have. °· All medicines you are taking, including vitamins, herbs, eye drops, creams, and over-the-counter medicines. °· Previous problems you or members of your family have had with the use of anesthetics. °· Any blood disorders you have. °· Previous surgeries you have had. °· Medical conditions you have. °RISKS AND COMPLICATIONS  °Generally, this is a safe procedure. However, as with any procedure, complications can occur. Possible complications include: °· Putting a hole in the uterus. °· Excessive bleeding. °· Infection. °· Damage to the cervix. °· Injury to other organs. °· Allergic reaction to medicines. °· Too much fluid used in the uterus for the procedure. °BEFORE THE PROCEDURE  °· Ask your health care provider about changing or stopping any regular medicines. °· Do not take aspirin or blood thinners for 1 week before the procedure, or as directed by your health care provider. These can cause bleeding. °· If you smoke, do not smoke for 2 weeks before the procedure. °· In some cases, a medicine is placed in the cervix the day before the procedure.  This medicine makes the cervix have a larger opening (dilate). This makes it easier for the instrument to be inserted into the uterus during the procedure. °· Do not eat or drink anything for at least 8 hours before the surgery. °· Arrange for someone to take you home after the procedure. °PROCEDURE  °· You may be given a medicine to relax you (sedative). You may also be given one of the following: °¨ A medicine that numbs the area around the cervix (local anesthetic). °¨ A medicine that makes you sleep through the procedure (general anesthetic). °· The hysteroscope is inserted through the vagina into the uterus. The camera on the hysteroscope sends a picture to a TV screen. This gives the surgeon a good view inside the uterus. °· During the procedure, air or a liquid is put into the uterus, which allows the surgeon to see better. °· Sometimes, tissue is gently scraped from inside the uterus. These tissue samples are sent to a lab for testing. °AFTER THE PROCEDURE  °· If you had a general anesthetic, you may be groggy for a couple hours after the procedure. °· If you had a local anesthetic, you will be able to go home as soon as you are stable and feel ready. °· You may have some cramping. This normally lasts for a couple days. °· You may have bleeding, which varies from light spotting for a few days to menstrual-like bleeding for 3-7 days. This is normal. °· If your test results are not back during the visit, make an appointment with your health care provider to find out the   results.   This information is not intended to replace advice given to you by your health care provider. Make sure you discuss any questions you have with your health care provider.   Document Released: 10/16/2000 Document Revised: 04/30/2013 Document Reviewed: 02/06/2013 Elsevier Interactive Patient Education [employer].

## 2016-05-03 NOTE — Progress Notes (Signed)
    Patient is a 55 year old that presented to the office today to discuss her sonohysterogram.She was seen the office for her annual exam was September 8 and had been complaining low abdominal pains on and off. Patient does have history of irritable bowel syndrome has had prior history of colon resection I've recommended she follow-up with her gastroenterologist.She reports no change in bowel movements recently or any blood in her stools. Ultrasound done in the office on September 27 had demonstrated the following:  Uterus measured 5.0 x 4.4 x 2.8 cm with endometrial stripe of 6.1 mm. Thickened endometrium with a cystic solid focus measuring 7 x 6 mm rounded by fluid was noted. Right ovary and left ovary were otherwise normal no fluid in the cul-de-sac  Because of this thickened endometrium and solid focus she was here for sonohysterogram.  Review of her record indicated that last year time of her annual exam her Pap smear was normal but HPV was detected. H6 down, 18 and 45 were negative. We had discussed the new guidelines recommended repeat Pap smear which was done today as well.  The cervix was cleansed with Betadine solution. A single  Tenaculum was placed on the anterior cervical lip the cervix required dilatation before inserting  A sterile catheter. Normal saline was instilled into the uterine cavity and the following was noted:  Following the injector the normal saline a posterior left uterine wall defect 7 x 9 x 4 mm was noted.  Assessment/plan: Patient with endometrial polyp no vaginal bleeding patient on no hormone replacement therapy. We are going to schedule an outpatient resectoscopic polypectomy in the next few months. If her symptoms continue low abdominal discomfort because of her history of IBS of recommend she follow up with her gastroenterologist.We will await the results of her Pap smear.

## 2016-05-04 ENCOUNTER — Telehealth: Payer: Self-pay

## 2016-05-04 LAB — PAP IG W/ RFLX HPV ASCU

## 2016-05-04 NOTE — Telephone Encounter (Signed)
Patient called back to confirm that she would like to schedule surgery for 05/23/16.  Time will be determined closer to date.  Rosemarie Ax will call her to schedule pre op appt with Dr. Moshe Salisbury.

## 2016-05-04 NOTE — Telephone Encounter (Signed)
I spoke with patient regarding scheduling surgery. We discussed her ins benefits and her estimated surgery prepayment to [place].  We discussed dates and I am tentatively holding 05/23/16 for her. She is going to check her calendar and call me this afternoon to confirm.

## 2016-05-08 ENCOUNTER — Encounter: Payer: Self-pay | Admitting: Anesthesiology

## 2016-05-12 NOTE — Patient Instructions (Signed)
Your procedure is scheduled on:  Tuesday, Oct. 31, 2017  Enter through the [employer] [facility] at:  11:30 AM  Pick up the phone at the desk and dial [phone].  Call this number if you have problems the morning of surgery: [phone].  Remember: Do NOT eat food:  After Midnight Monday  Do NOT drink clear liquids after:  9:00 AM day of surgery  Take these medicines the morning of surgery with a SIP OF WATER:  Wellbutrin, Xanax if needed  Stop taking fish oil at this time  Stop ALL herbal medications at this time   Do NOT wear jewelry (body piercing), metal hair clips/bobby pins, make-up, or nail polish. Do NOT wear lotions, powders, or perfumes.  You may wear deodorant. Do NOT shave for 48 hours prior to surgery. Do NOT bring valuables to the hospital. Contacts, dentures, or bridgework may not be worn into surgery.  Have a responsible adult drive you home and stay with you for 24 hours after your procedure

## 2016-05-15 ENCOUNTER — Encounter (HOSPITAL_COMMUNITY): Payer: Self-pay

## 2016-05-15 ENCOUNTER — Encounter (HOSPITAL_COMMUNITY)
Admission: RE | Admit: 2016-05-15 | Discharge: 2016-05-15 | Disposition: A | Discharge status: Home / Self Care | Payer: BLUE CROSS/BLUE SHIELD | Source: Ambulatory Visit | Attending: Gynecology | Admitting: Gynecology

## 2016-05-15 DIAGNOSIS — Z01812 Encounter for preprocedural laboratory examination: Secondary | ICD-10-CM | POA: Insufficient documentation

## 2016-05-15 DIAGNOSIS — N840 Polyp of corpus uteri: Secondary | ICD-10-CM | POA: Insufficient documentation

## 2016-05-15 LAB — CBC
HEMATOCRIT: 37.9 % (ref 36.0–46.0)
Hemoglobin: 12.8 g/dL (ref 12.0–15.0)
MCH: 31.7 pg (ref 26.0–34.0)
MCHC: 33.8 g/dL (ref 30.0–36.0)
MCV: 93.8 fL (ref 78.0–100.0)
Platelets: 210 10*3/uL (ref 150–400)
RBC: 4.04 MIL/uL (ref 3.87–5.11)
RDW: 12.7 % (ref 11.5–15.5)
WBC: 5.5 10*3/uL (ref 4.0–10.5)

## 2016-05-17 ENCOUNTER — Encounter: Payer: Self-pay | Admitting: Gynecology

## 2016-05-17 ENCOUNTER — Ambulatory Visit (INDEPENDENT_AMBULATORY_CARE_PROVIDER_SITE_OTHER): Payer: BLUE CROSS/BLUE SHIELD | Admitting: Gynecology

## 2016-05-17 VITALS — BP 130/82

## 2016-05-17 DIAGNOSIS — N840 Polyp of corpus uteri: Secondary | ICD-10-CM | POA: Diagnosis not present

## 2016-05-17 DIAGNOSIS — Z01818 Encounter for other preprocedural examination: Secondary | ICD-10-CM

## 2016-05-17 NOTE — Progress Notes (Signed)
Jessica Chan is an 55 y.o. who is scheduled for surgery October 31 is here for preoperative exam and consultation. Patient been seen the office in September 8 for annual exam and had been complaining low abdominal pains on and off. Patient does have history of irritable bowel syndrome has had prior history of colon resection I've recommended she follow-up with her gastroenterologist.She reports no change in bowel movements recently or any blood in her stools. Ultrasound done in the office on September 27 had demonstrated the following:  Uterus measured 5.0 x 4.4 x 2.8 cm with endometrial stripe of 6.1 mm. Thickened endometrium with a cystic solid focus measuring 7 x 6 mm rounded by fluid was noted. Right ovary and left ovary were otherwise normal no fluid in the cul-de-sac  Because of this thickened endometrium and solid focus she was here for sonohysterogram.  Review of her record indicated that last year time of her annual exam her Pap smear was normal but HPV was detected.  HPV 16, 18 and 45 were negative. We had discussed the new guidelines recommended repeat Pap smear which was done today as well.  The cervix was cleansed with Betadine solution. A single  Tenaculum was placed on the anterior cervical lip the cervix required dilatation before inserting  A sterile catheter. Normal saline was instilled into the uterine cavity and the following was noted:  Following the injection of  the normal saline a posterior left uterine wall defect 7 x 9 x 4 mm was noted.  Pertinent Gynecological History: Menses: post-menopausal Bleeding: none Contraception: none DES exposure: unknown Blood transfusions: none Sexually transmitted diseases: no past history Previous GYN Procedures: 2 NSVD  Last mammogram: normal Date: 2017  Last pap: normal Date: 2017  OB History: G2, P2   Menstrual History: Menarche age: 64 Patient's last menstrual period was 07/25/2011.    Past Medical History:    Diagnosis Date  . Allergic rhinitis   . Anxiety   . Anxiety disorder   . BLEPHARITIS 05/27/2007   Qualifier: Diagnosis of  By: Hulan Saas, CMA (AAMA), [place] Skye   . Depression   . Diverticulitis   . Diverticulitis of colon - recurrent 10/02/2013  . Diverticulosis   . ESOPHAGEAL STRICTURE 11/15/2009   Qualifier: Diagnosis of  By: Trellis Paganini PA-c, Amy S   . GERD (gastroesophageal reflux disease)   . Hemangioma of liver    incidental finding-large, right and left lobes of liver   . Hiatal hernia   . IBS (irritable bowel syndrome)   . PONV (postoperative nausea and vomiting)    SEVERE NAUSEA & VOMITING  . Sinusitis, acute maxillary 07/29/2012   right  hx of sinus surgery  fu if needed recurrecne      Past Surgical History:  Procedure Laterality Date  . adnoidectomy    . COLONOSCOPY    . endoscopy with dialation    . NASAL SINUS SURGERY    . PROCTOSCOPY N/A 07/02/2014   Procedure: PROCTOSCOPY;  Surgeon: Michael Boston, MD;  Location: [facility];  Service: General;  Laterality: N/A;  . ROBOT ASSISTED LAPAROSCOPIC PARTIAL COLECTOMY    . TONSILLECTOMY    . ulnar nerve right arm    . UPPER GASTROINTESTINAL ENDOSCOPY      Family History  Problem Relation Age of Onset  . Lung cancer Mother   . Autism Son   . Colon cancer Maternal Uncle   . Heart disease Father   . Breast cancer Maternal Grandmother  Social History:  reports that she has never smoked. She has never used smokeless tobacco. She reports that she drinks alcohol. She reports that she does not use drugs.  Allergies:  Allergies  Allergen Reactions  . Shellfish Allergy Hives, Swelling and Other (See Comments)    Swelling lips and numbness in lips and nose and itching throat after eating shrimp. and also sensitive touching shrimp with burning sensation and pain  . Demerol [Meperidine] Hives and Rash  . Amoxicillin-Pot Clavulanate Nausea Only    Has patient had a PCN reaction causing immediate rash, facial/tongue/throat  swelling, SOB or lightheadedness with hypotension: no Has patient had a PCN reaction causing severe rash involving mucus membranes or skin necrosis: no Has patient had a PCN reaction that required hospitalization no Has patient had a PCN reaction occurring within the last 10 years: maybe? If all of the above answers are "NO", then may proceed with Cephalosporin use.   . Meperidine Hcl     Red patches all over body  . Oxycodone Nausea Only     (Not in a hospital admission)  REVIEW OF SYSTEMS: A ROS was performed and pertinent positives and negatives are included in the history.  GENERAL: No fevers or chills. HEENT: No change in vision, no earache, sore throat or sinus congestion. NECK: No pain or stiffness. CARDIOVASCULAR: No chest pain or pressure. No palpitations. PULMONARY: No shortness of breath, cough or wheeze. GASTROINTESTINAL: No abdominal pain, nausea, vomiting or diarrhea, melena or bright red blood per rectum. GENITOURINARY: No urinary frequency, urgency, hesitancy or dysuria. MUSCULOSKELETAL: No joint or muscle pain, no back pain, no recent trauma. DERMATOLOGIC: No rash, no itching, no lesions. ENDOCRINE: No polyuria, polydipsia, no heat or cold intolerance. No recent change in weight. HEMATOLOGICAL: No anemia or easy bruising or bleeding. NEUROLOGIC: No headache, seizures, numbness, tingling or weakness. PSYCHIATRIC: No depression, no loss of interest in normal activity or change in sleep pattern.     Blood pressure 130/82, last menstrual period 07/25/2011.  Physical Exam:  HEENT:unremarkable Neck:Supple, midline, no thyroid megaly, no carotid bruits Lungs:  Clear to auscultation no rhonchi's or wheezes Heart:Regular rate and rhythm, no murmurs or gallops Breast Exam: Done recently at time of annual exam normal Abdomen: Soft nontender no rebound or guarding Pelvic:BUS within normal limits Vagina: No lesions or discharge Cervix: No lesions or discharge Uterus: Anteverted  normal size shape and consistency Adnexa: No palpable masses or tenderness Extremities: No cords, no edema Rectal: Not done  Assessment/Plan: 55 year old patient with endometrial polyp scheduled for resectoscopic polypectomy next week the following risk and benefits from her surgery discussed:                        Patient was counseled as to the risk of surgery to include the following:  1. Infection (prohylactic antibiotics will be administered)  2. DVT/Pulmonary Embolism (prophylactic pneumo compression stockings will be used)  3.Trauma to internal organs requiring additional surgical procedure to repair any injury to     Internal organs requiring perhaps additional hospitalization days.  4.Hemmorhage requiring transfusion and blood products which carry risks such as   anaphylactic reaction, hepatitis and AIDS  Patient had received literature information on the procedure scheduled and all her questions were answered and fully accepts all risk.   [facility] HMD9:43 AMTD     Uvaldo Rising H 05/17/2016, 9:25 AM

## 2016-05-22 MED ORDER — GENTAMICIN SULFATE 40 MG/ML IJ SOLN
INTRAVENOUS | Status: AC
  Administered 2016-05-23: 100 mL via INTRAVENOUS
  Filled 2016-05-22: qty 6.25

## 2016-05-23 ENCOUNTER — Encounter (HOSPITAL_COMMUNITY)
Admission: RE | Disposition: A | Discharge status: Home / Self Care | Payer: Self-pay | Source: Ambulatory Visit | Attending: Gynecology

## 2016-05-23 ENCOUNTER — Ambulatory Visit (HOSPITAL_COMMUNITY)
Admission: RE | Admit: 2016-05-23 | Discharge: 2016-05-23 | Disposition: A | Discharge status: Home / Self Care | Payer: BLUE CROSS/BLUE SHIELD | Source: Ambulatory Visit | Attending: Gynecology | Admitting: Gynecology

## 2016-05-23 ENCOUNTER — Ambulatory Visit (HOSPITAL_COMMUNITY): Payer: BLUE CROSS/BLUE SHIELD | Admitting: Anesthesiology

## 2016-05-23 ENCOUNTER — Encounter (HOSPITAL_COMMUNITY): Payer: Self-pay | Admitting: Anesthesiology

## 2016-05-23 DIAGNOSIS — F329 Major depressive disorder, single episode, unspecified: Secondary | ICD-10-CM | POA: Diagnosis not present

## 2016-05-23 DIAGNOSIS — Z9049 Acquired absence of other specified parts of digestive tract: Secondary | ICD-10-CM | POA: Insufficient documentation

## 2016-05-23 DIAGNOSIS — Z880 Allergy status to penicillin: Secondary | ICD-10-CM | POA: Diagnosis not present

## 2016-05-23 DIAGNOSIS — Z8719 Personal history of other diseases of the digestive system: Secondary | ICD-10-CM | POA: Diagnosis not present

## 2016-05-23 DIAGNOSIS — H01009 Unspecified blepharitis unspecified eye, unspecified eyelid: Secondary | ICD-10-CM | POA: Diagnosis not present

## 2016-05-23 DIAGNOSIS — Z91013 Allergy to seafood: Secondary | ICD-10-CM | POA: Insufficient documentation

## 2016-05-23 DIAGNOSIS — K449 Diaphragmatic hernia without obstruction or gangrene: Secondary | ICD-10-CM | POA: Insufficient documentation

## 2016-05-23 DIAGNOSIS — M5430 Sciatica, unspecified side: Secondary | ICD-10-CM | POA: Insufficient documentation

## 2016-05-23 DIAGNOSIS — Z888 Allergy status to other drugs, medicaments and biological substances status: Secondary | ICD-10-CM | POA: Diagnosis not present

## 2016-05-23 DIAGNOSIS — N840 Polyp of corpus uteri: Secondary | ICD-10-CM | POA: Diagnosis not present

## 2016-05-23 DIAGNOSIS — F419 Anxiety disorder, unspecified: Secondary | ICD-10-CM | POA: Diagnosis not present

## 2016-05-23 DIAGNOSIS — Z885 Allergy status to narcotic agent status: Secondary | ICD-10-CM | POA: Diagnosis not present

## 2016-05-23 DIAGNOSIS — K589 Irritable bowel syndrome without diarrhea: Secondary | ICD-10-CM | POA: Diagnosis not present

## 2016-05-23 DIAGNOSIS — K219 Gastro-esophageal reflux disease without esophagitis: Secondary | ICD-10-CM | POA: Insufficient documentation

## 2016-05-23 HISTORY — PX: DILATATION & CURETTAGE/HYSTEROSCOPY WITH MYOSURE: SHX6511

## 2016-05-23 SURGERY — DILATATION & CURETTAGE/HYSTEROSCOPY WITH MYOSURE
Anesthesia: General | Site: Vagina

## 2016-05-23 MED ORDER — SODIUM CHLORIDE 0.9 % IJ SOLN
INTRAMUSCULAR | Status: AC
  Filled 2016-05-23: qty 50

## 2016-05-23 MED ORDER — PROPOFOL 10 MG/ML IV BOLUS
INTRAVENOUS | Status: DC | PRN
  Administered 2016-05-23: 200 mg via INTRAVENOUS

## 2016-05-23 MED ORDER — VASOPRESSIN 20 UNIT/ML IV SOLN
INTRAVENOUS | Status: AC
  Filled 2016-05-23: qty 1

## 2016-05-23 MED ORDER — LIDOCAINE HCL (CARDIAC) 20 MG/ML IV SOLN
INTRAVENOUS | Status: DC | PRN
  Administered 2016-05-23: 100 mg via INTRAVENOUS

## 2016-05-23 MED ORDER — ONDANSETRON HCL 4 MG/2ML IJ SOLN
INTRAMUSCULAR | Status: DC | PRN
  Administered 2016-05-23: 4 mg via INTRAVENOUS

## 2016-05-23 MED ORDER — FENTANYL CITRATE (PF) 100 MCG/2ML IJ SOLN
INTRAMUSCULAR | Status: AC
  Filled 2016-05-23: qty 2

## 2016-05-23 MED ORDER — DEXAMETHASONE SODIUM PHOSPHATE 4 MG/ML IJ SOLN
INTRAMUSCULAR | Status: DC | PRN
  Administered 2016-05-23: 10 mg via INTRAVENOUS

## 2016-05-23 MED ORDER — LACTATED RINGERS IV SOLN
INTRAVENOUS | Status: DC
  Administered 2016-05-23: 11:00:00 via INTRAVENOUS

## 2016-05-23 MED ORDER — SCOPOLAMINE 1 MG/3DAYS TD PT72
1.0000 | MEDICATED_PATCH | Freq: Once | TRANSDERMAL | Status: DC
  Administered 2016-05-23: 1.5 mg via TRANSDERMAL

## 2016-05-23 MED ORDER — MIDAZOLAM HCL 5 MG/5ML IJ SOLN
INTRAMUSCULAR | Status: DC | PRN
  Administered 2016-05-23: 2 mg via INTRAVENOUS

## 2016-05-23 MED ORDER — FENTANYL CITRATE (PF) 100 MCG/2ML IJ SOLN: 25.0000 ug | INTRAMUSCULAR | Status: DC | PRN

## 2016-05-23 MED ORDER — MIDAZOLAM HCL 2 MG/2ML IJ SOLN
INTRAMUSCULAR | Status: AC
  Filled 2016-05-23: qty 2

## 2016-05-23 MED ORDER — SCOPOLAMINE 1 MG/3DAYS TD PT72
MEDICATED_PATCH | TRANSDERMAL | Status: DC
Start: 2016-05-23 — End: 2016-05-23
  Administered 2016-05-23: 1.5 mg via TRANSDERMAL
  Filled 2016-05-23: qty 1

## 2016-05-23 MED ORDER — KETOROLAC TROMETHAMINE 30 MG/ML IJ SOLN
INTRAMUSCULAR | Status: DC | PRN
  Administered 2016-05-23: 30 mg via INTRAVENOUS

## 2016-05-23 MED ORDER — FENTANYL CITRATE (PF) 100 MCG/2ML IJ SOLN
INTRAMUSCULAR | Status: DC | PRN
  Administered 2016-05-23: 50 ug via INTRAVENOUS

## 2016-05-23 MED ORDER — PROMETHAZINE HCL 25 MG/ML IJ SOLN: 6.2500 mg | INTRAMUSCULAR | Status: DC | PRN

## 2016-05-23 SURGICAL SUPPLY — 24 items
CANISTERS HI-FLOW 3000CC (CANNISTER) ×3 IMPLANT
CATH FOLEY 2WAY SLVR 30CC 16FR (CATHETERS) IMPLANT
CATH ROBINSON RED A/P 16FR (CATHETERS) ×3 IMPLANT
CLOTH BEACON ORANGE TIMEOUT ST (SAFETY) ×3 IMPLANT
CONTAINER PREFILL 10% NBF 60ML (FORM) ×6 IMPLANT
DEVICE MYOSURE LITE (MISCELLANEOUS) IMPLANT
DEVICE MYOSURE REACH (MISCELLANEOUS) ×2 IMPLANT
FILTER ARTHROSCOPY CONVERTOR (FILTER) ×3 IMPLANT
GLOVE BIOGEL PI IND STRL 7.0 (GLOVE) ×1 IMPLANT
GLOVE BIOGEL PI IND STRL 8 (GLOVE) ×1 IMPLANT
GLOVE BIOGEL PI INDICATOR 7.0 (GLOVE) ×2
GLOVE BIOGEL PI INDICATOR 8 (GLOVE) ×2
GLOVE ECLIPSE 7.5 STRL STRAW (GLOVE) ×6 IMPLANT
GOWN STRL REUS W/TWL LRG LVL3 (GOWN DISPOSABLE) ×6 IMPLANT
PACK VAGINAL MINOR WOMEN LF (CUSTOM PROCEDURE TRAY) ×3 IMPLANT
PAD OB MATERNITY 4.3X12.25 (PERSONAL CARE ITEMS) ×3 IMPLANT
PAD PREP 24X48 CUFFED NSTRL (MISCELLANEOUS) ×3 IMPLANT
PLUG CATH AND CAP STER (CATHETERS) IMPLANT
SEAL ROD LENS SCOPE MYOSURE (ABLATOR) ×3 IMPLANT
SYR 30ML LL (SYRINGE) IMPLANT
TOWEL OR 17X24 6PK STRL BLUE (TOWEL DISPOSABLE) ×6 IMPLANT
TUBING AQUILEX INFLOW (TUBING) ×3 IMPLANT
TUBING AQUILEX OUTFLOW (TUBING) ×3 IMPLANT
WATER STERILE IRR 1000ML POUR (IV SOLUTION) ×3 IMPLANT

## 2016-05-23 NOTE — Op Note (Signed)
   Operative Note  05/23/2016  12:02 PM  PATIENT:  Jessica Chan  55 y.o. female  PRE-OPERATIVE DIAGNOSIS:  endometrial polyp  POST-OPERATIVE DIAGNOSIS:  endometrial polyp  PROCEDURE:  Procedure(s): DILATATION & CURETTAGE/HYSTEROSCOPY WITH MYOSURE  SURGEON:  Surgeon(s): Terrance Mass, MD  ANESTHESIA:   general  FINDINGS: Endometrial polyp hypervascular lower uterine segment otherwise normal endocervical canal and both tubal ostia identified  DESCRIPTION OF OPERATION:DESCRIPTION OF OPERATION: FINDINGS: The patient was taken to the operating room where she underwent a successful general endotracheal anesthesia. Patient had PAS stockings for DVT prophylaxis. She received 2 g of Ancef preop. Time out was undertaken to properly identify the patient and the proper operation schedule. The vagina and perineum were prepped and draped in usual sterile fashion. A red rubber Quentin Cornwall was inserted to empty the bladder is content for approximately 50 cc. Bimanual examination demonstrated an anteverted uterus. Patient's legs were in the high lithotomy position. A weighted speculum was placed in the posterior vaginal vault. A single-tooth tenaculum was placed on the anterior cervical lip. The uterus sounded to 7 cm. Pratt dilator were used to dilate the cervical canal to a 21 mm size. The Hologic Myosure resectoscopic morcellator with a scope of 6.25 mm and an operating blade of 3.0 mm was introduced into the intrauterine cavity. 0.9% normal saline was the distending media. Inspection of the endometrial cavity demonstrated a large hypervascular endometrial polyp was noted in the lower uterine segment. With the resectoscopic morcellator they were removed and submitted for histological evaluation. Fluid deficit was approximately 75 cc.  The single-tooth tenaculum was removed patient was extubated transferred to recovery room stable vital signs blood loss was minimal. She received 30 mg of Toradol in route  to the recovery room.     ESTIMATED BLOOD LOSS: Minimal   Intake/Output Summary (Last 24 hours) at 05/23/16 1202 Last data filed at 05/23/16 1152  Gross per 24 hour  Intake              800 ml  Output               55 ml  Net              745 ml     BLOOD ADMINISTERED:none   LOCAL MEDICATIONS USED:  NONE  SPECIMEN:  Source of Specimen:  Endometrial polyp  DISPOSITION OF SPECIMEN:  PATHOLOGY  COUNTS:  YES  PLAN OF CARE: Transfer to [place] HMD12:02 PMTD@

## 2016-05-23 NOTE — H&P (View-Only) (Signed)
    Patient is a 55 year old that presented to the office today to discuss her sonohysterogram.She was seen the office for her annual exam was September 8 and had been complaining low abdominal pains on and off. Patient does have history of irritable bowel syndrome has had prior history of colon resection I've recommended she follow-up with her gastroenterologist.She reports no change in bowel movements recently or any blood in her stools. Ultrasound done in the office on September 27 had demonstrated the following:  Uterus measured 5.0 x 4.4 x 2.8 cm with endometrial stripe of 6.1 mm. Thickened endometrium with a cystic solid focus measuring 7 x 6 mm rounded by fluid was noted. Right ovary and left ovary were otherwise normal no fluid in the cul-de-sac  Because of this thickened endometrium and solid focus she was here for sonohysterogram.  Review of her record indicated that last year time of her annual exam her Pap smear was normal but HPV was detected. H6 down, 18 and 45 were negative. We had discussed the new guidelines recommended repeat Pap smear which was done today as well.  The cervix was cleansed with Betadine solution. A single  Tenaculum was placed on the anterior cervical lip the cervix required dilatation before inserting  A sterile catheter. Normal saline was instilled into the uterine cavity and the following was noted:  Following the injector the normal saline a posterior left uterine wall defect 7 x 9 x 4 mm was noted.  Assessment/plan: Patient with endometrial polyp no vaginal bleeding patient on no hormone replacement therapy. We are going to schedule an outpatient resectoscopic polypectomy in the next few months. If her symptoms continue low abdominal discomfort because of her history of IBS of recommend she follow up with her gastroenterologist.We will await the results of her Pap smear.

## 2016-05-23 NOTE — Transfer of Care (Signed)
Immediate Anesthesia Transfer of Care Note  Patient: Jessica Chan  Procedure(s) Performed: Procedure(s) with comments: DILATATION & CURETTAGE/HYSTEROSCOPY WITH MYOSURE (N/A) - Request to post this for 12:30pm. That will leave room for a second major case for [place]. If we do not get a 2nd case in the next week I will move it up to follow. Thanks!  Needs one hour.  REQUEST FOR TINA RN AND AUDREY CST  Patient Location: PACU  Anesthesia Type:General  Level of Consciousness: awake, alert  and oriented  Airway & Oxygen Therapy: Patient Spontanous Breathing and Patient connected to nasal cannula oxygen  Post-op Assessment: Report given to RN and Post -op Vital signs reviewed and stable  Post vital signs: Reviewed and stable  Last Vitals:  Vitals:   05/23/16 1101  BP: 117/80  Pulse: (!) 0  Resp: 16  Temp: 36.7 C    Last Pain:  Vitals:   05/23/16 1101  TempSrc: Oral      Patients Stated Pain Goal: 3 ([id number])  Complications: No apparent anesthesia complications

## 2016-05-23 NOTE — Anesthesia Postprocedure Evaluation (Signed)
Anesthesia Post Note  Patient: ANOKHI ALONGE  Procedure(s) Performed: Procedure(s) (LRB): DILATATION & CURETTAGE/HYSTEROSCOPY WITH MYOSURE (N/A)  Patient location during evaluation: PACU Anesthesia Type: General Level of consciousness: awake and alert Pain management: pain level controlled Vital Signs Assessment: post-procedure vital signs reviewed and stable Respiratory status: spontaneous breathing, nonlabored ventilation and respiratory function stable Cardiovascular status: blood pressure returned to baseline and stable Postop Assessment: no signs of nausea or vomiting Anesthetic complications: no     Last Vitals:  Vitals:   05/23/16 1206 05/23/16 1207  BP: 100/65   Pulse:  72  Resp:  11  Temp:  36.4 C    Last Pain:  Vitals:   05/23/16 1230  TempSrc:   PainSc: 2    Pain Goal: Patients Stated Pain Goal: 3 (05/23/16 1101)               Nilda Simmer

## 2016-05-23 NOTE — Anesthesia Procedure Notes (Signed)
Procedure Name: LMA Insertion Date/Time: 05/23/2016 11:33 AM Performed by: Riki Sheer Pre-anesthesia Checklist: Patient identified, Emergency Drugs available, Suction available, Patient being monitored and Timeout performed Patient Re-evaluated:Patient Re-evaluated prior to inductionOxygen Delivery Method: Circle system utilized Preoxygenation: Pre-oxygenation with 100% oxygen Intubation Type: IV induction Ventilation: Mask ventilation without difficulty LMA: LMA inserted LMA Size: 4.0 Number of attempts: 1 Placement Confirmation: CO2 detector,  positive ETCO2 and breath sounds checked- equal and bilateral Tube secured with: Tape Dental Injury: Teeth and Oropharynx as per pre-operative assessment

## 2016-05-23 NOTE — Discharge Instructions (Signed)
DISCHARGE INSTRUCTIONS: HYSTEROSCOPY / ENDOMETRIAL ABLATION The following instructions have been prepared to help you care for yourself upon your return home.  May Remove Scop patch on or before Friday.  Wash hand well after removing patch.  May take Ibuprofen after 5:49 pm today  May take stool softner while taking narcotic pain medication to prevent constipation.  Drink plenty of water.  Personal hygiene:  Use sanitary pads for vaginal drainage, not tampons.  Shower the day after your procedure.  NO tub baths, pools or Jacuzzis for 2-3 weeks.  Wipe front to back after using the bathroom.  Activity and limitations:  Do NOT drive or operate any equipment for 24 hours. The effects of anesthesia are still present and drowsiness may result.  Do NOT rest in bed all day.  Walking is encouraged.  Walk up and down stairs slowly.  You may resume your normal activity in one to two days or as indicated by your physician. Sexual activity: NO intercourse for at least 2 weeks after the procedure, or as indicated by your Doctor.  Diet: Eat a light meal as desired this evening. You may resume your usual diet tomorrow.  Return to Work: You may resume your work activities in one to two days or as indicated by Marine scientist.  What to expect after your surgery: Expect to have vaginal bleeding/discharge for 2-3 days and spotting for up to 10 days. It is not unusual to have soreness for up to 1-2 weeks. You may have a slight burning sensation when you urinate for the first day. Mild cramps may continue for a couple of days. You may have a regular period in 2-6 weeks.  Call your doctor for any of the following:  Excessive vaginal bleeding or clotting, saturating and changing one pad every hour.  Inability to urinate 6 hours after discharge from hospital.  Pain not relieved by pain medication.  Fever of 100.4 F or greater.  Unusual vaginal discharge or odor.  Return to office  _________________Call for an appointment ___________________  Patients signature: ______________________  Nurses signature ________________________  Support person's signature____________________________

## 2016-05-23 NOTE — Anesthesia Preprocedure Evaluation (Addendum)
Anesthesia Evaluation  Patient identified by MRN, date of birth, ID band Patient awake and Patient unresponsive    Reviewed: Allergy & Precautions, NPO status , Patient's Chart, lab work & pertinent test results  History of Anesthesia Complications (+) PONV, DIFFICULT AIRWAY and history of anesthetic complications  Airway Mallampati: II  TM Distance: >3 FB Neck ROM: Full   Comment: Previous grade III view with MAC 4 (Future Recommendations: Recommend- induction with short-acting agent, and alternative techniques readily available Comments: Cords visualized with cricoid pressure/head lift.  Anterior larynx, slight tmj pop on left when opening mouth.) Dental  (+) Chipped, Dental Advisory Given   Pulmonary neg pulmonary ROS, neg shortness of breath, neg sleep apnea, neg COPD, neg recent URI,    Pulmonary exam normal breath sounds clear to auscultation       Cardiovascular negative cardio ROS   Rhythm:Regular Rate:Normal     Neuro/Psych neg Seizures PSYCHIATRIC DISORDERS Anxiety Depression  Neuromuscular disease (sciatica)    GI/Hepatic hiatal hernia, GERD  ,Hemangioma of liver IBS, s/p partial colectomy, recurrent diverticulitis, h/o esophageal stricture   Endo/Other  negative endocrine ROS  Renal/GU negative Renal ROS     Musculoskeletal   Abdominal   Peds  Hematology negative hematology ROS (+)   Anesthesia Other Findings blepharitis  Reproductive/Obstetrics                           Anesthesia Physical Anesthesia Plan  ASA: III  Anesthesia Plan: General   Post-op Pain Management:    Induction: Intravenous  Airway Management Planned: LMA  Additional Equipment:   Intra-op Plan:   Post-operative Plan: Extubation in OR  Informed Consent: I have reviewed the patients History and Physical, chart, labs and discussed the procedure including the risks, benefits and alternatives for the  proposed anesthesia with the patient or authorized representative who has indicated his/her understanding and acceptance.   Dental advisory given  Plan Discussed with: CRNA  Anesthesia Plan Comments: (Risks of general anesthesia discussed including, but not limited to, sore throat, hoarse voice, chipped/damaged teeth, injury to vocal cords, nausea and vomiting, allergic reactions, lung infection, heart attack, stroke, and death. All questions answered. )       Anesthesia Quick Evaluation

## 2016-05-23 NOTE — Interval H&P Note (Signed)
History and Physical Interval Note:  05/23/2016 11:13 AM  Jessica Chan  has presented today for surgery, with the diagnosis of endometrial polyp  The various methods of treatment have been discussed with the patient and family. After consideration of risks, benefits and other options for treatment, the patient has consented to  Procedure(s) with comments: [place] (N/A) - Request to post this for 12:30pm. That will leave room for a second major case for [place]. If we do not get a 2nd case in the next week I will move it up to follow. Thanks!  Needs one hour.  REQUEST FOR TINA RN AND AUDREY CST as a surgical intervention .  The patient's history has been reviewed, patient examined, no change in status, stable for surgery.  I have reviewed the patient's chart and labs.  Questions were answered to the patient's satisfaction.     Terrance Mass

## 2016-05-24 ENCOUNTER — Encounter (HOSPITAL_COMMUNITY): Payer: Self-pay | Admitting: Gynecology

## 2016-06-12 ENCOUNTER — Ambulatory Visit (INDEPENDENT_AMBULATORY_CARE_PROVIDER_SITE_OTHER): Payer: BLUE CROSS/BLUE SHIELD | Admitting: Gynecology

## 2016-06-12 ENCOUNTER — Encounter: Payer: Self-pay | Admitting: Gynecology

## 2016-06-12 VITALS — BP 112/68

## 2016-06-12 DIAGNOSIS — Z09 Encounter for follow-up examination after completed treatment for conditions other than malignant neoplasm: Secondary | ICD-10-CM

## 2016-06-12 NOTE — Progress Notes (Signed)
   Patient is a 55 year old that presented to the office for her 3 week postop visit. Patient status post resectoscopic polypectomy is result of endometrial polyp. She is asymptomatic. Findings from surgery as well as pictures were shown to the patient as well as pathology report as follows:  FINDINGS: Endometrial polyp hypervascular lower uterine segment otherwise normal endocervical canal and both tubal ostia identified  Pathology report: Diagnosis Endometrial polyp - BENIGN ENDOMETRIOID-TYPE POLYP(S). - THERE IS NO EVIDENCE OF HYPERPLASIA OR MALIGNANCY.  Exam: Bartholin urethra Skene was within normal limits Vagina: No lesions or discharge Cervix: Irritated area work tenaculum had been placed otherwise normal appearance Uterus: Anteverted normal size shape and consistency Adnexa: No palpable masses or tenderness Rectal exam: Not done  Assessment/plan: Postmenopausal patient status post resectoscopic polypectomy 3 weeks ago pathology report benign. Patient doing well otherwise scheduled to return back next September for annual exam or when necessary.

## 2016-07-11 DIAGNOSIS — D2230 Melanocytic nevi of unspecified part of face: Secondary | ICD-10-CM | POA: Diagnosis not present

## 2016-07-11 DIAGNOSIS — D2272 Melanocytic nevi of left lower limb, including hip: Secondary | ICD-10-CM | POA: Diagnosis not present

## 2016-07-11 DIAGNOSIS — D2271 Melanocytic nevi of right lower limb, including hip: Secondary | ICD-10-CM | POA: Diagnosis not present

## 2016-07-11 DIAGNOSIS — L821 Other seborrheic keratosis: Secondary | ICD-10-CM | POA: Diagnosis not present

## 2016-07-11 DIAGNOSIS — D0462 Carcinoma in situ of skin of left upper limb, including shoulder: Secondary | ICD-10-CM | POA: Diagnosis not present

## 2016-07-11 DIAGNOSIS — D485 Neoplasm of uncertain behavior of skin: Secondary | ICD-10-CM | POA: Diagnosis not present

## 2016-08-16 DIAGNOSIS — D0461 Carcinoma in situ of skin of right upper limb, including shoulder: Secondary | ICD-10-CM | POA: Diagnosis not present

## 2016-08-19 ENCOUNTER — Other Ambulatory Visit: Payer: Self-pay | Admitting: Internal Medicine

## 2016-08-24 NOTE — Telephone Encounter (Signed)
Pt scheduled for 10/04/16 (only day she could come) with lab work before.  No cpx since 2016.  Ok to fill until she comes?

## 2016-08-24 NOTE — Telephone Encounter (Signed)
Left a message for a return call on cell (preferred #).  Pt needs cpx and lab work.

## 2016-08-26 NOTE — Telephone Encounter (Signed)
Yes  Ok to fill 90 days

## 2016-08-28 NOTE — Telephone Encounter (Signed)
Sent to the pharmacy by e-scribe. 

## 2016-09-13 DIAGNOSIS — G5621 Lesion of ulnar nerve, right upper limb: Secondary | ICD-10-CM | POA: Diagnosis not present

## 2016-09-22 ENCOUNTER — Telehealth: Payer: Self-pay | Admitting: Internal Medicine

## 2016-09-22 MED ORDER — HYDROCORTISONE 2.5 % RE CREA: 1.0000 "application " | TOPICAL_CREAM | Freq: Two times a day (BID) | RECTAL | 0 refills | Status: DC

## 2016-09-22 NOTE — Telephone Encounter (Signed)
Discussed further with Dr. Carlean Purl.  Patient reports minimal pain.  More of an annoyance than pain.  Ok to send in 2.5 % hydrocortisone .  Patient notified

## 2016-09-22 NOTE — Telephone Encounter (Signed)
If it is prolapsed and painful it is probably thrombosed.  Sitz baths and Recticare are what usu recommended - if she has not tried those then she should  Going to urgent care for eval and possibly having it opened up if needed would be my next recommendation

## 2016-09-22 NOTE — Telephone Encounter (Signed)
Patient reports that she has a prolapsed hemorrhoid. She reports it is causing he some pain.  She was offered an appt for today, but she is out of town.  She reports that she has tried multiple OTC products with no relief.  Can we send her in anything?

## 2016-09-25 ENCOUNTER — Telehealth: Payer: Self-pay

## 2016-09-25 NOTE — Telephone Encounter (Signed)
See previous phone notes.  Patient will come in on Friday and see Dr. Carlean Purl at 3:30

## 2016-09-27 ENCOUNTER — Other Ambulatory Visit (INDEPENDENT_AMBULATORY_CARE_PROVIDER_SITE_OTHER): Payer: BLUE CROSS/BLUE SHIELD

## 2016-09-27 DIAGNOSIS — G5621 Lesion of ulnar nerve, right upper limb: Secondary | ICD-10-CM | POA: Diagnosis not present

## 2016-09-27 DIAGNOSIS — Z0000 Encounter for general adult medical examination without abnormal findings: Secondary | ICD-10-CM | POA: Diagnosis not present

## 2016-09-27 LAB — CBC WITH DIFFERENTIAL/PLATELET
BASOS PCT: 0.4 % (ref 0.0–3.0)
Basophils Absolute: 0 10*3/uL (ref 0.0–0.1)
EOS PCT: 1.2 % (ref 0.0–5.0)
Eosinophils Absolute: 0.1 10*3/uL (ref 0.0–0.7)
HEMATOCRIT: 41.4 % (ref 36.0–46.0)
HEMOGLOBIN: 13.8 g/dL (ref 12.0–15.0)
LYMPHS PCT: 26.5 % (ref 12.0–46.0)
Lymphs Abs: 1.6 10*3/uL (ref 0.7–4.0)
MCHC: 33.3 g/dL (ref 30.0–36.0)
MCV: 95 fl (ref 78.0–100.0)
MONO ABS: 0.5 10*3/uL (ref 0.1–1.0)
MONOS PCT: 9 % (ref 3.0–12.0)
Neutro Abs: 3.7 10*3/uL (ref 1.4–7.7)
Neutrophils Relative %: 62.9 % (ref 43.0–77.0)
Platelets: 199 10*3/uL (ref 150.0–400.0)
RBC: 4.35 Mil/uL (ref 3.87–5.11)
RDW: 13.5 % (ref 11.5–15.5)
WBC: 5.9 10*3/uL (ref 4.0–10.5)

## 2016-09-27 LAB — BASIC METABOLIC PANEL
BUN: 22 mg/dL (ref 6–23)
CHLORIDE: 102 meq/L (ref 96–112)
CO2: 33 mEq/L — ABNORMAL HIGH (ref 19–32)
Calcium: 9.2 mg/dL (ref 8.4–10.5)
Creatinine, Ser: 0.78 mg/dL (ref 0.40–1.20)
GFR: 81.18 mL/min (ref >60.00)
Glucose, Bld: 81 mg/dL (ref 70–99)
POTASSIUM: 4.3 meq/L (ref 3.5–5.1)
SODIUM: 141 meq/L (ref 135–145)

## 2016-09-27 LAB — LIPID PANEL
CHOLESTEROL: 208 mg/dL — AB (ref 0–200)
HDL: 56.8 mg/dL (ref >39.00)
LDL CALC: 122 mg/dL — AB (ref 0–99)
NONHDL: 151.22
Total CHOL/HDL Ratio: 4
Triglycerides: 146 mg/dL (ref 0.0–149.0)
VLDL: 29.2 mg/dL (ref 0.0–40.0)

## 2016-09-27 LAB — HEPATIC FUNCTION PANEL
ALT: 11 U/L (ref 0–35)
AST: 12 U/L (ref 0–37)
Albumin: 4.3 g/dL (ref 3.5–5.2)
Alkaline Phosphatase: 49 U/L (ref 39–117)
BILIRUBIN TOTAL: 0.5 mg/dL (ref 0.2–1.2)
Bilirubin, Direct: 0.1 mg/dL (ref 0.0–0.3)
TOTAL PROTEIN: 6.4 g/dL (ref 6.0–8.3)

## 2016-09-27 LAB — TSH: TSH: 4.17 u[IU]/mL (ref 0.35–4.50)

## 2016-09-29 ENCOUNTER — Encounter: Payer: Self-pay | Admitting: Internal Medicine

## 2016-09-29 ENCOUNTER — Ambulatory Visit (INDEPENDENT_AMBULATORY_CARE_PROVIDER_SITE_OTHER): Payer: BLUE CROSS/BLUE SHIELD | Admitting: Internal Medicine

## 2016-09-29 VITALS — BP 100/80 | Ht 64.0 in | Wt 106.1 lb

## 2016-09-29 DIAGNOSIS — L309 Dermatitis, unspecified: Secondary | ICD-10-CM

## 2016-09-29 DIAGNOSIS — K648 Other hemorrhoids: Secondary | ICD-10-CM | POA: Diagnosis not present

## 2016-09-29 DIAGNOSIS — K588 Other irritable bowel syndrome: Secondary | ICD-10-CM

## 2016-09-29 DIAGNOSIS — K644 Residual hemorrhoidal skin tags: Secondary | ICD-10-CM | POA: Diagnosis not present

## 2016-09-29 MED ORDER — NYSTATIN-TRIAMCINOLONE 100000-0.1 UNIT/GM-% EX OINT: 1.0000 "application " | TOPICAL_OINTMENT | Freq: Two times a day (BID) | CUTANEOUS | 0 refills | Status: DC

## 2016-09-29 MED ORDER — BENEFIBER PO POWD: ORAL | 0 refills | Status: DC

## 2016-09-29 NOTE — Patient Instructions (Signed)
If you are age 56 or older, your body mass index should be between 23-30. Your Body mass index is 18.22 kg/m. If this is out of the aforementioned range listed, please consider follow up with your Primary Care Provider.  If you are age 47 or younger, your body mass index should be between 19-25. Your Body mass index is 18.22 kg/m. If this is out of the aformentioned range listed, please consider follow up with your Primary Care Provider.   Dr Carlean Purl has sent a prescription for an ointment for you to pick up at your pharmacy.  Continue to use Hydrocortisone cream  And sitz bath.    Take 1 tablespoon of Benefiber daily.   Please call us with an update in one week.  Thank you for choosing me and [facility].  Silvano Rusk, MD

## 2016-09-29 NOTE — Progress Notes (Signed)
   Jessica Chan 56 y.o. Dec 26, 1960 [id number]  Assessment & Plan:   Encounter Diagnoses  Name Primary?  . Perianal dermatitis Yes  . Anal skin tags   . Internal hemorrhoids   . Other irritable bowel syndrome    I think most sxs are from anal tags and seems to have a perianal dermatitis. Try Tx nystatin triamcinolone topical and local care +/- the internal 2.5% rectal hydrocortisone.  Add Benefiber 1 tbsp qd to help with IBS/incomplete defecation.  Call back w/ update and if worse. Anticipate self-limited course.  YQ:MGNOIB,BCWUG Harmon Pier, MD     Subjective:   Chief Complaint: hemorrhoids  HPI Has had 2 wks anal irritation and swelling - I called in rectal HC cream - not helping much after 1 week. Is doing sitz baths. Feels irritated, slight itching perhaps and swollen in anal area. Has better defecation after colon resection for diverticulitis but has incomplete defecation sense. No bleeding. Not over wiping. Sxs not keeping awake.  Medications, allergies, past medical history, past surgical history, family history and social history are reviewed and updated in the EMR.  Review of Systems As above  Objective:   Physical Exam BP 100/80   Ht 5\' 4"  (1.626 m)   Wt 106 lb 2 oz (48.1 kg)   LMP 07/25/2011   BMI 18.22 kg/m  NAD  Female staff present  Rectal - mod anal tags surrounding, soft, fleshy and not tender, moderate circumferential perianal erythema, nontender DRE, no mass, formed brown stool  Anoscopy - Gr 1 mildly inflamed internal hemorrhoids w/ slight external component

## 2016-09-30 ENCOUNTER — Encounter: Payer: Self-pay | Admitting: Internal Medicine

## 2016-10-03 NOTE — Progress Notes (Signed)
No chief complaint on file.   HPI: Patient  Jessica Chan  56 y.o. comes in today for Preventive Health Care visit  And med check up . On wellbutrin taking only every other day at times   Not depressed some anxiety but doing ok except for sleep   Has gyne and gi doing ok    Had hemorrhoidal tag.  Sleep still problemaitc and ocass use of ambien  . awakings  Mid  Night and cant go back ot sleep  X minimal with    Health Maintenance  Topic Date Due  . Hepatitis C Screening  10/04/2017 (Originally 01/23/1961)  . HIV Screening  10/04/2017 (Originally 09/05/1975)  . MAMMOGRAM  04/03/2018  . PAP SMEAR  05/04/2019  . COLONOSCOPY  01/28/2020  . TETANUS/TDAP  05/18/2025  . INFLUENZA VACCINE  Addressed   Health Maintenance Review LIFESTYLE:  Exercise:  y Tobacco/ETS:n Alcohol: 1 per night when husband in town Sugar beverages:n Sleep: falling aslee awakens and cant go back  Problematic over years not hot flushes  Poss worse when family illnesses and crises  Now ok  Drug use: no HH of 3 son with ASD Gyne      ROS:  GEN/ HEENT: No fever, significant weight changes sweats headaches vision problems hearing changes, CV/ PULM; No chest pain shortness of breath cough, syncope,edema  change in exercise tolerance. GI /GU: No adominal pain, vomiting, change in bowel habits. No blood in the stool. No significant GU symptoms. SKIN/HEME: ,no acute skin rashes suspicious lesions or bleeding. No lymphadenopathy, nodules, masses.  NEURO/ PSYCH:  No neurologic signs such as weakness numbness. No depression anxiety. IMM/ Allergy: No unusual infections.  Allergy .   REST of 12 system review negative except as per HPI   Past Medical History:  Diagnosis Date  . Allergic rhinitis   . Anxiety   . Anxiety disorder   . BLEPHARITIS 05/27/2007   Qualifier: Diagnosis of  By: Hulan Saas, CMA (AAMA), [place] Skye   . Depression   . Diverticulitis   . Diverticulitis of colon - recurrent 10/02/2013  .  Diverticulosis   . ESOPHAGEAL STRICTURE 11/15/2009   Qualifier: Diagnosis of  By: Trellis Paganini PA-c, Amy S   . GERD (gastroesophageal reflux disease)   . Hemangioma of liver    incidental finding-large, right and left lobes of liver   . Hiatal hernia   . IBS (irritable bowel syndrome)   . PONV (postoperative nausea and vomiting)    SEVERE NAUSEA & VOMITING  . Sinusitis, acute maxillary 07/29/2012   right  hx of sinus surgery  fu if needed recurrecne      Past Surgical History:  Procedure Laterality Date  . adnoidectomy    . COLONOSCOPY    . DILATATION & CURETTAGE/HYSTEROSCOPY WITH MYOSURE N/A 05/23/2016   Procedure: DILATATION & CURETTAGE/HYSTEROSCOPY WITH MYOSURE;  Surgeon: Terrance Mass, MD;  Location: [facility];  Service: Gynecology;  Laterality: N/A;  Request to post this for 12:30pm. That will leave room for a second major case for [place]. If we do not get a 2nd case in the next week I will move it up to follow. Thanks!  Needs one hour.  REQUEST FOR TINA RN AND AUDREY CST  . endoscopy with dialation    . NASAL SINUS SURGERY    . PROCTOSCOPY N/A 07/02/2014   Procedure: PROCTOSCOPY;  Surgeon: Michael Boston, MD;  Location: [facility];  Service: General;  Laterality: N/A;  . ROBOT ASSISTED LAPAROSCOPIC PARTIAL COLECTOMY    .  TONSILLECTOMY    . ulnar nerve right arm    . UPPER GASTROINTESTINAL ENDOSCOPY      Family History  Problem Relation Age of Onset  . Lung cancer Mother   . Autism Son   . Colon cancer Maternal Uncle   . Heart disease Father   . Breast cancer Maternal Grandmother     Social History   Social History  . Marital status: Married    Spouse name: N/A  . Number of children: 2  . Years of education: N/A   Occupational History  . Caregiver Unemployed   Social History Main Topics  . Smoking status: Never Smoker  . Smokeless tobacco: Never Used  . Alcohol use 0.0 oz/week     Comment: Social Drinker--SEVERAL DRINKS A WEEK  . Drug use: No  . Sexual activity: Yes      Partners: Male   Other Topics Concern  . None   Social History Narrative   Married HHof 4 one sone autistic   Husband had prostate cancer in last few years   Some exercise  Runs    No tobacco    Outpatient Medications Prior to Visit  Medication Sig Dispense Refill  . ALPRAZolam (XANAX) 0.25 MG tablet Take 0.25 mg by mouth daily as needed for anxiety.     Marland Kitchen BIOTIN PO Take 1 capsule by mouth daily.    Marland Kitchen buPROPion (WELLBUTRIN XL) 150 MG 24 hr tablet TAKE 1 TABLET EVERY MORNING 90 tablet 0  . hydrocortisone (ANUSOL-HC) 2.5 % rectal cream Place 1 application rectally 2 (two) times daily. 30 g 0  . Multiple Vitamin (MULTIVITAMIN) tablet Take 1 tablet by mouth every morning.     . nystatin-triamcinolone ointment (MYCOLOG) Apply 1 application topically 2 (two) times daily. 30 g 0  . Omega-3 Fatty Acids (FISH OIL PO) Take 1 capsule by mouth daily.    Marland Kitchen OVER THE COUNTER MEDICATION Take 1 tablet by mouth every morning. Relizen     . OVER THE COUNTER MEDICATION somnapure natural sleep aid..  Melatonin, l-theanine, valerian root, hops extract, lemon balm, chamomile flower and passion flower    . tretinoin (RETIN-A) 0.025 % cream apply to affected area at bedtime TO DRY SKIN FOR ACNE  0  . Wheat Dextrin (BENEFIBER) POWD 1 tbsp daily  0  . zolpidem (AMBIEN) 10 MG tablet Take 1 tablet (10 mg total) by mouth at bedtime as needed for sleep. 30 tablet 0  . Ketotifen Fumarate (THERA TEARS ALLERGY OP) Apply 2 drops to eye 2 (two) times daily.     No facility-administered medications prior to visit.      EXAM:  BP 110/80 (BP Location: Left Arm, Patient Position: Sitting, Cuff Size: Normal)   Pulse 63   Temp 97.4 F (36.3 C) (Oral)   Ht 5' 4.5" (1.638 m)   Wt 106 lb (48.1 kg)   LMP 07/25/2011   BMI 17.91 kg/m   Body mass index is 17.91 kg/m. Wt Readings from Last 3 Encounters:  10/04/16 106 lb (48.1 kg)  09/29/16 106 lb 2 oz (48.1 kg)  05/15/16 112 lb 2 oz (50.9 kg)    Physical  Exam: Vital signs reviewed QMG:QQPY is a well-developed well-nourished alert cooperative    who appearsr stated age in no acute distress.  HEENT: normocephalic atraumatic , Eyes: PERRL EOM's full, conjunctiva clear, Nares: paten,t no deformity discharge or tenderness., Ears: no deformity EAC's clear TMs with normal landmarks. Mouth: clear OP, no lesions, edema.  Moist  mucous membranes. Dentition in adequate repair. NECK: supple without masses, thyromegaly or bruits. CHEST/PULM:  Clear to auscultation and percussion breath sounds equal no wheeze , rales or rhonchi. No chest wall deformities or tenderness. Breast:  Per gyne  CV: PMI is nondisplaced, S1 S2 no gallops, murmurs, rubs. Peripheral pulses are full without delay.No JVD .  ABDOMEN: Bowel sounds normal nontender  No guard or rebound, no hepato splenomegal no CVA tenderness.  No hernia. Extremtities:  No clubbing cyanosis or edema, no acute joint swelling or redness no focal atrophy NEURO:  Oriented x3, cranial nerves 3-12 appear to be intact, no obvious focal weakness,gait within normal limits no abnormal reflexes or asymmetrical SKIN: No acute rashes normal turgor, color, no bruising or petechiae. PSYCH: Oriented, good eye contact, no obvious depression anxiety, cognition and judgment appear normal. LN: no cervical axillary  adenopathy  Lab Results  Component Value Date   WBC 5.9 09/27/2016   HGB 13.8 09/27/2016   HCT 41.4 09/27/2016   PLT 199.0 09/27/2016   GLUCOSE 81 09/27/2016   CHOL 208 (H) 09/27/2016   TRIG 146.0 09/27/2016   HDL 56.80 09/27/2016   LDLCALC 122 (H) 09/27/2016   ALT 11 09/27/2016   AST 12 09/27/2016   NA 141 09/27/2016   K 4.3 09/27/2016   CL 102 09/27/2016   CREATININE 0.78 09/27/2016   BUN 22 09/27/2016   CO2 33 (H) 09/27/2016   TSH 4.17 09/27/2016   HGBA1C 5.1 06/25/2014    BP Readings from Last 3 Encounters:  10/04/16 110/80  09/29/16 100/80  06/12/16 112/68    Lab results reviewed with  patient   ASSESSMENT AND PLAN:  Discussed the following assessment and plan:  Visit for preventive health examination  Medication management  Insomnia, unspecified type  Family history of thyroid disease Ongoing insomnia som from initial anx and noiw better but   Habit now awakenings using otc herbals valerian and melatonins    ocass ambien and aware of dependency issues thinks she got a se of friends trazadone [place] burning  Chest ,  Disc  remeron  belksomra  As an aid not  Solution  Off min etoh and cont  lsi . Other optinos as discussede  Risk benefit of medication discussed.  fam hx of thyroid disease but  tsh still in ok range   Disc   And check yearly  Patient Care Team: Burnis Medin, MD as PCP - General Gatha Mayer, MD as Attending Physician (Gastroenterology) Patient Instructions  Can try  Going off the wellbutrin . Continue the sleep hygiene  And   Consider trial belsomra or remeron  As needed for sleep but these are just aids.   Healthy lifestyle includes : At least 150 minutes of exercise weeks  , weight at healthy levels, which is usually   BMI 19-25. Avoid trans fats and processed foods;  Increase fresh fruits and veges to 5 servings per day. And avoid sweet beverages including tea and juice. Mediterranean diet with olive oil and nuts have been noted to be heart and brain healthy . Avoid tobacco products . Limit  alcohol to  7 per week for women and 14 servings for men.  Get adequate sleep . Wear seat belts . Don't text and drive .   Yearly check up of med check  If needed      Standley Brooking. Panosh M.D.

## 2016-10-04 ENCOUNTER — Encounter: Payer: Self-pay | Admitting: Internal Medicine

## 2016-10-04 ENCOUNTER — Ambulatory Visit (INDEPENDENT_AMBULATORY_CARE_PROVIDER_SITE_OTHER): Payer: BLUE CROSS/BLUE SHIELD | Admitting: Internal Medicine

## 2016-10-04 VITALS — BP 110/80 | HR 63 | Temp 97.4°F | Ht 64.5 in | Wt 106.0 lb

## 2016-10-04 DIAGNOSIS — G4700 Insomnia, unspecified: Secondary | ICD-10-CM | POA: Diagnosis not present

## 2016-10-04 DIAGNOSIS — Z79899 Other long term (current) drug therapy: Secondary | ICD-10-CM | POA: Diagnosis not present

## 2016-10-04 DIAGNOSIS — Z8349 Family history of other endocrine, nutritional and metabolic diseases: Secondary | ICD-10-CM | POA: Diagnosis not present

## 2016-10-04 DIAGNOSIS — Z0000 Encounter for general adult medical examination without abnormal findings: Secondary | ICD-10-CM

## 2016-10-04 MED ORDER — MIRTAZAPINE 15 MG PO TBDP: 15.0000 mg | ORAL_TABLET | Freq: Every day | ORAL | 3 refills | Status: DC

## 2016-10-04 NOTE — Patient Instructions (Signed)
Can try  Going off the wellbutrin . Continue the sleep hygiene  And   Consider trial belsomra or remeron  As needed for sleep but these are just aids.   Healthy lifestyle includes : At least 150 minutes of exercise weeks  , weight at healthy levels, which is usually   BMI 19-25. Avoid trans fats and processed foods;  Increase fresh fruits and veges to 5 servings per day. And avoid sweet beverages including tea and juice. Mediterranean diet with olive oil and nuts have been noted to be heart and brain healthy . Avoid tobacco products . Limit  alcohol to  7 per week for women and 14 servings for men.  Get adequate sleep . Wear seat belts . Don't text and drive .   Yearly check up of med check  If needed

## 2016-12-06 ENCOUNTER — Encounter: Payer: Self-pay | Admitting: Gynecology

## 2017-02-28 DIAGNOSIS — D2230 Melanocytic nevi of unspecified part of face: Secondary | ICD-10-CM | POA: Diagnosis not present

## 2017-02-28 DIAGNOSIS — D2271 Melanocytic nevi of right lower limb, including hip: Secondary | ICD-10-CM | POA: Diagnosis not present

## 2017-02-28 DIAGNOSIS — D225 Melanocytic nevi of trunk: Secondary | ICD-10-CM | POA: Diagnosis not present

## 2017-02-28 DIAGNOSIS — D2272 Melanocytic nevi of left lower limb, including hip: Secondary | ICD-10-CM | POA: Diagnosis not present

## 2017-04-13 ENCOUNTER — Encounter: Payer: Self-pay | Admitting: Internal Medicine

## 2017-05-18 ENCOUNTER — Encounter: Payer: Self-pay | Admitting: Internal Medicine

## 2017-05-18 DIAGNOSIS — Z1231 Encounter for screening mammogram for malignant neoplasm of breast: Secondary | ICD-10-CM | POA: Diagnosis not present

## 2017-05-18 LAB — HM MAMMOGRAPHY

## 2017-06-12 ENCOUNTER — Encounter: Payer: Self-pay | Admitting: Internal Medicine

## 2017-06-19 ENCOUNTER — Encounter: Payer: BLUE CROSS/BLUE SHIELD | Admitting: Obstetrics & Gynecology

## 2017-06-21 ENCOUNTER — Encounter: Payer: Self-pay | Admitting: Obstetrics & Gynecology

## 2017-06-21 ENCOUNTER — Ambulatory Visit (INDEPENDENT_AMBULATORY_CARE_PROVIDER_SITE_OTHER): Payer: BLUE CROSS/BLUE SHIELD | Admitting: Obstetrics & Gynecology

## 2017-06-21 VITALS — BP 122/76 | Ht 65.0 in | Wt 109.0 lb

## 2017-06-21 DIAGNOSIS — Z780 Asymptomatic menopausal state: Secondary | ICD-10-CM

## 2017-06-21 DIAGNOSIS — Z1151 Encounter for screening for human papillomavirus (HPV): Secondary | ICD-10-CM

## 2017-06-21 DIAGNOSIS — N952 Postmenopausal atrophic vaginitis: Secondary | ICD-10-CM | POA: Diagnosis not present

## 2017-06-21 DIAGNOSIS — Z01419 Encounter for gynecological examination (general) (routine) without abnormal findings: Secondary | ICD-10-CM | POA: Diagnosis not present

## 2017-06-21 NOTE — Progress Notes (Signed)
Jessica Chan [birth year] [id number]   History:    56 y.o.  G2P2L2  Married.  1 son 41 has Autism, lives at home.  Oldest son 51 yo.  RP:  Established patient presenting for annual gyn exam   HPI: Menopause, well without hormone replacement therapy.  No postmenopausal bleeding.  No pelvic pain.  Some vaginal dryness with intercourse.  Using Dahlgren.  Breasts normal.  Urine and bowel movements normal.  Health labs with family physician.  Stays fit, enjoys yoga and working in the yard.  Body mass index 18.14.  Past medical history,surgical history, family history and social history were all reviewed and documented in the EPIC chart.  Gynecologic History Patient's last menstrual period was 07/25/2011. Contraception: post menopausal status Last Pap: 04/2016. Results were: Negative Last mammogram: 04/2017. Results were: normal Bone Density 04/2015 Normal Colono 2011  Obstetric History OB History  Gravida Para Term Preterm AB Living  2 2       2   SAB TAB Ectopic Multiple Live Births               # Outcome Date GA Lbr Len/2nd Weight Sex Delivery Anes PTL Lv  2 Para     M Vag-Spont     1 Para     M Vag-Spont          ROS: A ROS was performed and pertinent positives and negatives are included in the history.  GENERAL: No fevers or chills. HEENT: No change in vision, no earache, sore throat or sinus congestion. NECK: No pain or stiffness. CARDIOVASCULAR: No chest pain or pressure. No palpitations. PULMONARY: No shortness of breath, cough or wheeze. GASTROINTESTINAL: No abdominal pain, nausea, vomiting or diarrhea, melena or bright red blood per rectum. GENITOURINARY: No urinary frequency, urgency, hesitancy or dysuria. MUSCULOSKELETAL: No joint or muscle pain, no back pain, no recent trauma. DERMATOLOGIC: No rash, no itching, no lesions. ENDOCRINE: No polyuria, polydipsia, no heat or cold intolerance. No recent change in weight. HEMATOLOGICAL: No anemia or easy bruising or bleeding.  NEUROLOGIC: No headache, seizures, numbness, tingling or weakness. PSYCHIATRIC: No depression, no loss of interest in normal activity or change in sleep pattern.     Exam:   BP 122/76   Ht 5\' 5"  (1.651 m)   Wt 109 lb (49.4 kg)   LMP 07/25/2011   BMI 18.14 kg/m   Body mass index is 18.14 kg/m.  General appearance : Well developed well nourished female. No acute distress HEENT: Eyes: no retinal hemorrhage or exudates,  Neck supple, trachea midline, no carotid bruits, no thyroidmegaly Lungs: Clear to auscultation, no rhonchi or wheezes, or rib retractions  Heart: Regular rate and rhythm, no murmurs or gallops Breast:Examined in sitting and supine position were symmetrical in appearance, no palpable masses or tenderness,  no skin retraction, no nipple inversion, no nipple discharge, no skin discoloration, no axillary or supraclavicular lymphadenopathy Abdomen: no palpable masses or tenderness, no rebound or guarding Extremities: no edema or skin discoloration or tenderness  Pelvic: Vulva normal except for Atrophy of Menopause.  Bartholin, Urethra, Skene Glands: Within normal limits             Vagina: No gross lesions or discharge  Cervix: No gross lesions or discharge.  Pap/HPV HR done.  Uterus  AV, normal size, shape and consistency, non-tender and mobile  Adnexa  Without masses or tenderness  Anus and perineum  normal    Assessment/Plan:  56 y.o. female for annual exam  1. Encounter for routine gynecological examination with Papanicolaou smear of cervix Normal gynecologic exam except for atrophic vaginitis.  Pap test with HPV high risk done.  Breast exam normal.  Recent mammogram October 2018 normal.  Colonoscopy 2011.  Fasting health labs with family physician.  2. Menopause present Menopause well-tolerated without hormone replacement therapy.  No postmenopausal bleeding.  Vitamin D supplements and calcium rich nutrition recommended.  Continue with weightbearing physical  activity.  Last bone density normal in 2016.  Will repeat at 3 years.  3. Post-menopause atrophic vaginitis Recommend Astroglide lubricant for intercourse.  Alternatively can try coconut oil.  Princess Bruins MD, 9:11 AM 06/21/2017

## 2017-06-21 NOTE — Addendum Note (Signed)
Addended by: Thurnell Garbe A on: 06/21/2017 09:39 AM   Modules accepted: Orders

## 2017-06-21 NOTE — Patient Instructions (Signed)
1. Encounter for routine gynecological examination with Papanicolaou smear of cervix Normal gynecologic exam except for atrophic vaginitis.  Pap test with HPV high risk done.  Breast exam normal.  Recent mammogram October 2018 normal.  Colonoscopy 2011.  Fasting health labs with family physician.  2. Menopause present Menopause well-tolerated without hormone replacement therapy.  No postmenopausal bleeding.  Vitamin D supplements and calcium rich nutrition recommended.  Continue with weightbearing physical activity.  Last bone density normal in 2016.  Will repeat at 3 years.  3. Post-menopause atrophic vaginitis Recommend Astroglide lubricant for intercourse.  Alternatively can try coconut oil.  Jessica Chan, it was a pleasure meeting you today!  I will inform you of your results as soon as available.   Health Maintenance for Postmenopausal Women Menopause is a normal process in which your reproductive ability comes to an end. This process happens gradually over a span of months to years, usually between the ages of 47 and 70. Menopause is complete when you have missed 12 consecutive menstrual periods. It is important to talk with your health care provider about some of the most common conditions that affect postmenopausal women, such as heart disease, cancer, and bone loss (osteoporosis). Adopting a healthy lifestyle and getting preventive care can help to promote your health and wellness. Those actions can also lower your chances of developing some of these common conditions. What should I know about menopause? During menopause, you may experience a number of symptoms, such as:  Moderate-to-severe hot flashes.  Night sweats.  Decrease in sex drive.  Mood swings.  Headaches.  Tiredness.  Irritability.  Memory problems.  Insomnia.  Choosing to treat or not to treat menopausal changes is an individual decision that you make with your health care provider. What should I know about  hormone replacement therapy and supplements? Hormone therapy products are effective for treating symptoms that are associated with menopause, such as hot flashes and night sweats. Hormone replacement carries certain risks, especially as you become older. If you are thinking about using estrogen or estrogen with progestin treatments, discuss the benefits and risks with your health care provider. What should I know about heart disease and stroke? Heart disease, heart attack, and stroke become more likely as you age. This may be due, in part, to the hormonal changes that your body experiences during menopause. These can affect how your body processes dietary fats, triglycerides, and cholesterol. Heart attack and stroke are both medical emergencies. There are many things that you can do to help prevent heart disease and stroke:  Have your blood pressure checked at least every 1-2 years. High blood pressure causes heart disease and increases the risk of stroke.  If you are 68-48 years old, ask your health care provider if you should take aspirin to prevent a heart attack or a stroke.  Do not use any tobacco products, including cigarettes, chewing tobacco, or electronic cigarettes. If you need help quitting, ask your health care provider.  It is important to eat a healthy diet and maintain a healthy weight. ? Be sure to include plenty of vegetables, fruits, low-fat dairy products, and lean protein. ? Avoid eating foods that are high in solid fats, added sugars, or salt (sodium).  Get regular exercise. This is one of the most important things that you can do for your health. ? Try to exercise for at least 150 minutes each week. The type of exercise that you do should increase your heart rate and make you sweat. This is  known as moderate-intensity exercise. ? Try to do strengthening exercises at least twice each week. Do these in addition to the moderate-intensity exercise.  Know your numbers.Ask your  health care provider to check your cholesterol and your blood glucose. Continue to have your blood tested as directed by your health care provider.  What should I know about cancer screening? There are several types of cancer. Take the following steps to reduce your risk and to catch any cancer development as early as possible. Breast Cancer  Practice breast self-awareness. ? This means understanding how your breasts normally appear and feel. ? It also means doing regular breast self-exams. Let your health care provider know about any changes, no matter how small.  If you are 23 or older, have a clinician do a breast exam (clinical breast exam or CBE) every year. Depending on your age, family history, and medical history, it may be recommended that you also have a yearly breast X-ray (mammogram).  If you have a family history of breast cancer, talk with your health care provider about genetic screening.  If you are at high risk for breast cancer, talk with your health care provider about having an MRI and a mammogram every year.  Breast cancer (BRCA) gene test is recommended for women who have family members with BRCA-related cancers. Results of the assessment will determine the need for genetic counseling and BRCA1 and for BRCA2 testing. BRCA-related cancers include these types: ? Breast. This occurs in males or females. ? Ovarian. ? Tubal. This may also be called fallopian tube cancer. ? Cancer of the abdominal or pelvic lining (peritoneal cancer). ? Prostate. ? Pancreatic.  Cervical, Uterine, and Ovarian Cancer Your health care provider may recommend that you be screened regularly for cancer of the pelvic organs. These include your ovaries, uterus, and vagina. This screening involves a pelvic exam, which includes checking for microscopic changes to the surface of your cervix (Pap test).  For women ages 21-65, health care providers may recommend a pelvic exam and a Pap test every three  years. For women ages 69-65, they may recommend the Pap test and pelvic exam, combined with testing for human papilloma virus (HPV), every five years. Some types of HPV increase your risk of cervical cancer. Testing for HPV may also be done on women of any age who have unclear Pap test results.  Other health care providers may not recommend any screening for nonpregnant women who are considered low risk for pelvic cancer and have no symptoms. Ask your health care provider if a screening pelvic exam is right for you.  If you have had past treatment for cervical cancer or a condition that could lead to cancer, you need Pap tests and screening for cancer for at least 20 years after your treatment. If Pap tests have been discontinued for you, your risk factors (such as having a new sexual partner) need to be reassessed to determine if you should start having screenings again. Some women have medical problems that increase the chance of getting cervical cancer. In these cases, your health care provider may recommend that you have screening and Pap tests more often.  If you have a family history of uterine cancer or ovarian cancer, talk with your health care provider about genetic screening.  If you have vaginal bleeding after reaching menopause, tell your health care provider.  There are currently no reliable tests available to screen for ovarian cancer.  Lung Cancer Lung cancer screening is recommended for adults  66-65 years old who are at high risk for lung cancer because of a history of smoking. A yearly low-dose CT scan of the lungs is recommended if you:  Currently smoke.  Have a history of at least 30 pack-years of smoking and you currently smoke or have quit within the past 15 years. A pack-year is smoking an average of one pack of cigarettes per day for one year.  Yearly screening should:  Continue until it has been 15 years since you quit.  Stop if you develop a health problem that would  prevent you from having lung cancer treatment.  Colorectal Cancer  This type of cancer can be detected and can often be prevented.  Routine colorectal cancer screening usually begins at age 7 and continues through age 66.  If you have risk factors for colon cancer, your health care provider may recommend that you be screened at an earlier age.  If you have a family history of colorectal cancer, talk with your health care provider about genetic screening.  Your health care provider may also recommend using home test kits to check for hidden blood in your stool.  A small camera at the end of a tube can be used to examine your colon directly (sigmoidoscopy or colonoscopy). This is done to check for the earliest forms of colorectal cancer.  Direct examination of the colon should be repeated every 5-10 years until age 22. However, if early forms of precancerous polyps or small growths are found or if you have a family history or genetic risk for colorectal cancer, you may need to be screened more often.  Skin Cancer  Check your skin from head to toe regularly.  Monitor any moles. Be sure to tell your health care provider: ? About any new moles or changes in moles, especially if there is a change in a mole's shape or color. ? If you have a mole that is larger than the size of a pencil eraser.  If any of your family members has a history of skin cancer, especially at a young age, talk with your health care provider about genetic screening.  Always use sunscreen. Apply sunscreen liberally and repeatedly throughout the day.  Whenever you are outside, protect yourself by wearing long sleeves, pants, a wide-brimmed hat, and sunglasses.  What should I know about osteoporosis? Osteoporosis is a condition in which bone destruction happens more quickly than new bone creation. After menopause, you may be at an increased risk for osteoporosis. To help prevent osteoporosis or the bone fractures that  can happen because of osteoporosis, the following is recommended:  If you are 82-17 years old, get at least 1,000 mg of calcium and at least 600 mg of vitamin D per day.  If you are older than age 59 but younger than age 78, get at least 1,200 mg of calcium and at least 600 mg of vitamin D per day.  If you are older than age 72, get at least 1,200 mg of calcium and at least 800 mg of vitamin D per day.  Smoking and excessive alcohol intake increase the risk of osteoporosis. Eat foods that are rich in calcium and vitamin D, and do weight-bearing exercises several times each week as directed by your health care provider. What should I know about how menopause affects my mental health? Depression may occur at any age, but it is more common as you become older. Common symptoms of depression include:  Low or sad mood.  Changes  in sleep patterns.  Changes in appetite or eating patterns.  Feeling an overall lack of motivation or enjoyment of activities that you previously enjoyed.  Frequent crying spells.  Talk with your health care provider if you think that you are experiencing depression. What should I know about immunizations? It is important that you get and maintain your immunizations. These include:  Tetanus, diphtheria, and pertussis (Tdap) booster vaccine.  Influenza every year before the flu season begins.  Pneumonia vaccine.  Shingles vaccine.  Your health care provider may also recommend other immunizations. This information is not intended to replace advice given to you by your health care provider. Make sure you discuss any questions you have with your health care provider. Document Released: 09/01/2005 Document Revised: 01/28/2016 Document Reviewed: 04/13/2015 [employer] Interactive Patient Education  2018 [employer]  

## 2017-06-25 LAB — PAP, TP IMAGING W/ HPV RNA, RFLX HPV TYPE 16,18/45: HPV DNA High Risk: NOT DETECTED

## 2017-10-06 ENCOUNTER — Other Ambulatory Visit: Payer: Self-pay

## 2017-10-06 ENCOUNTER — Emergency Department (HOSPITAL_COMMUNITY)
Admission: EM | Admit: 2017-10-06 | Discharge: 2017-10-06 | Disposition: A | Discharge status: Home / Self Care | Payer: BLUE CROSS/BLUE SHIELD | Attending: Physician Assistant | Admitting: Physician Assistant

## 2017-10-06 ENCOUNTER — Emergency Department (HOSPITAL_COMMUNITY): Payer: BLUE CROSS/BLUE SHIELD

## 2017-10-06 ENCOUNTER — Encounter (HOSPITAL_COMMUNITY): Payer: Self-pay | Admitting: *Deleted

## 2017-10-06 DIAGNOSIS — Z23 Encounter for immunization: Secondary | ICD-10-CM | POA: Diagnosis not present

## 2017-10-06 DIAGNOSIS — Y92002 Bathroom of unspecified non-institutional (private) residence single-family (private) house as the place of occurrence of the external cause: Secondary | ICD-10-CM | POA: Diagnosis not present

## 2017-10-06 DIAGNOSIS — Y9389 Activity, other specified: Secondary | ICD-10-CM | POA: Insufficient documentation

## 2017-10-06 DIAGNOSIS — W01198A Fall on same level from slipping, tripping and stumbling with subsequent striking against other object, initial encounter: Secondary | ICD-10-CM | POA: Diagnosis not present

## 2017-10-06 DIAGNOSIS — R1110 Vomiting, unspecified: Secondary | ICD-10-CM | POA: Diagnosis not present

## 2017-10-06 DIAGNOSIS — R5383 Other fatigue: Secondary | ICD-10-CM | POA: Diagnosis not present

## 2017-10-06 DIAGNOSIS — S01511A Laceration without foreign body of lip, initial encounter: Secondary | ICD-10-CM | POA: Diagnosis not present

## 2017-10-06 DIAGNOSIS — Y999 Unspecified external cause status: Secondary | ICD-10-CM | POA: Insufficient documentation

## 2017-10-06 DIAGNOSIS — Z79899 Other long term (current) drug therapy: Secondary | ICD-10-CM | POA: Diagnosis not present

## 2017-10-06 DIAGNOSIS — R197 Diarrhea, unspecified: Secondary | ICD-10-CM | POA: Diagnosis not present

## 2017-10-06 DIAGNOSIS — R112 Nausea with vomiting, unspecified: Secondary | ICD-10-CM | POA: Diagnosis not present

## 2017-10-06 DIAGNOSIS — S0990XA Unspecified injury of head, initial encounter: Secondary | ICD-10-CM | POA: Diagnosis not present

## 2017-10-06 DIAGNOSIS — R55 Syncope and collapse: Secondary | ICD-10-CM | POA: Diagnosis not present

## 2017-10-06 DIAGNOSIS — K529 Noninfective gastroenteritis and colitis, unspecified: Secondary | ICD-10-CM | POA: Diagnosis not present

## 2017-10-06 LAB — CBC
HEMATOCRIT: 35.8 % — AB (ref 36.0–46.0)
Hemoglobin: 12 g/dL (ref 12.0–15.0)
MCH: 30.9 pg (ref 26.0–34.0)
MCHC: 33.5 g/dL (ref 30.0–36.0)
MCV: 92.3 fL (ref 78.0–100.0)
Platelets: 174 10*3/uL (ref 150–400)
RBC: 3.88 MIL/uL (ref 3.87–5.11)
RDW: 12.4 % (ref 11.5–15.5)
WBC: 5.4 10*3/uL (ref 4.0–10.5)

## 2017-10-06 LAB — COMPREHENSIVE METABOLIC PANEL
ALBUMIN: 4 g/dL (ref 3.5–5.0)
ALT: 14 U/L (ref 14–54)
AST: 24 U/L (ref 15–41)
Alkaline Phosphatase: 56 U/L (ref 38–126)
Anion gap: 12 (ref 5–15)
BUN: 15 mg/dL (ref 6–20)
CO2: 24 mmol/L (ref 22–32)
Calcium: 8.7 mg/dL — ABNORMAL LOW (ref 8.9–10.3)
Chloride: 104 mmol/L (ref 101–111)
Creatinine, Ser: 0.71 mg/dL (ref 0.44–1.00)
GFR calc Af Amer: >60 mL/min (ref >60)
GFR calc non Af Amer: >60 mL/min (ref >60)
GLUCOSE: 118 mg/dL — AB (ref 65–99)
POTASSIUM: 3.8 mmol/L (ref 3.5–5.1)
Sodium: 140 mmol/L (ref 135–145)
Total Bilirubin: 0.7 mg/dL (ref 0.3–1.2)
Total Protein: 6.4 g/dL — ABNORMAL LOW (ref 6.5–8.1)

## 2017-10-06 LAB — URINALYSIS, ROUTINE W REFLEX MICROSCOPIC
Bacteria, UA: NONE SEEN
Bilirubin Urine: NEGATIVE
Glucose, UA: NEGATIVE mg/dL
Ketones, ur: NEGATIVE mg/dL
Leukocytes, UA: NEGATIVE
Nitrite: NEGATIVE
Protein, ur: NEGATIVE mg/dL
Specific Gravity, Urine: 1.027 (ref 1.005–1.030)
Squamous Epithelial / HPF: NONE SEEN
WBC, UA: NONE SEEN WBC/hpf (ref 0–5)
pH: 7 (ref 5.0–8.0)

## 2017-10-06 LAB — TROPONIN I

## 2017-10-06 LAB — LIPASE, BLOOD: Lipase: 39 U/L (ref 11–51)

## 2017-10-06 MED ORDER — IOPAMIDOL (ISOVUE-300) INJECTION 61%
INTRAVENOUS | Status: AC
  Administered 2017-10-06: 100 mL
  Filled 2017-10-06: qty 100

## 2017-10-06 MED ORDER — ONDANSETRON 4 MG PO TBDP
8.0000 mg | ORAL_TABLET | Freq: Once | ORAL | Status: AC
  Administered 2017-10-06: 8 mg via ORAL
  Filled 2017-10-06: qty 2

## 2017-10-06 MED ORDER — GI COCKTAIL ~~LOC~~
30.0000 mL | Freq: Once | ORAL | Status: AC
  Administered 2017-10-06: 30 mL via ORAL
  Filled 2017-10-06: qty 30

## 2017-10-06 MED ORDER — ONDANSETRON 4 MG PO TBDP: 4.0000 mg | ORAL_TABLET | Freq: Three times a day (TID) | ORAL | 0 refills | Status: DC | PRN

## 2017-10-06 MED ORDER — SUCRALFATE 1 GM/10ML PO SUSP: 1.0000 g | Freq: Three times a day (TID) | ORAL | 0 refills | Status: DC

## 2017-10-06 MED ORDER — TETANUS-DIPHTH-ACELL PERTUSSIS 5-2.5-18.5 LF-MCG/0.5 IM SUSP
0.5000 mL | Freq: Once | INTRAMUSCULAR | Status: AC
  Administered 2017-10-06: 0.5 mL via INTRAMUSCULAR
  Filled 2017-10-06: qty 0.5

## 2017-10-06 MED ORDER — SODIUM CHLORIDE 0.9 % IV BOLUS (SEPSIS)
1000.0000 mL | Freq: Once | INTRAVENOUS | Status: AC
Start: 2017-10-06 — End: 2017-10-06
  Administered 2017-10-06: 1000 mL via INTRAVENOUS

## 2017-10-06 MED ORDER — SODIUM CHLORIDE 0.9 % IV BOLUS (SEPSIS)
1000.0000 mL | Freq: Once | INTRAVENOUS | Status: AC
  Administered 2017-10-06: 1000 mL via INTRAVENOUS

## 2017-10-06 MED ORDER — OMEPRAZOLE 20 MG PO CPDR: 20.0000 mg | DELAYED_RELEASE_CAPSULE | Freq: Every day | ORAL | 0 refills | Status: DC

## 2017-10-06 MED ORDER — LIDOCAINE HCL (PF) 1 % IJ SOLN
5.0000 mL | Freq: Once | INTRAMUSCULAR | Status: AC
  Administered 2017-10-06: 5 mL via INTRADERMAL
  Filled 2017-10-06: qty 5

## 2017-10-06 NOTE — ED Notes (Signed)
Patient able to ambulate independently  

## 2017-10-06 NOTE — ED Triage Notes (Signed)
Vomiting in triage burning in her throat and esophagus from the vomiting

## 2017-10-06 NOTE — ED Triage Notes (Signed)
The pt is c/o being sick for olne week with nausea vomiting and diarrhea.  She was up vomiting in the br and fainted x2  AND LACERATED HER LOWER LIP

## 2017-10-06 NOTE — ED Notes (Signed)
ED Provider at bedside. 

## 2017-10-06 NOTE — ED Provider Notes (Signed)
[facility] Provider Note   CSN: [id number] Arrival date & time: 10/06/17  0540     History   Chief Complaint Chief Complaint  Patient presents with  . Emesis    HPI Jessica Chan is a 57 y.o. female.  HPI   Patient is a 57 year old female presenting with nausea vomiting diarrhea.  Patient reports that on Tuesday she went to [place], she was hardly able to make at home until before her diarrhea started.  She reports 2-3 days of this intense diarrhea and vomiting.  Patient began to feel better yesterday.  However when she got up to go to the bathroom this morning she syncopized while the toilet.  Striking her head.  Patient sustained small laceration to her lip.  Patient's past medical history significant for colon resection after complicated diverticulitis.  Past Medical History:  Diagnosis Date  . Allergic rhinitis   . Anxiety   . Anxiety disorder   . BLEPHARITIS 05/27/2007   Qualifier: Diagnosis of  By: Hulan Saas, CMA (AAMA), [place] Sky[place]   . Depression   . Diverticulitis   . Diverticulitis of colon - recurrent 10/02/2013  . Diverticulosis   . ESOPHAGEAL STRICTURE 11/15/2009   Qualifier: Diagnosis of  By: Trellis Paganini PA-c, Amy S   . GERD (gastroesophageal reflux disease)   . Hemangioma of liver    incidental finding-large, right and left lobes of liver   . Hiatal hernia   . IBS (irritable bowel syndrome)   . PONV (postoperative nausea and vomiting)    SEVERE NAUSEA & VOMITING  . Sinusitis, acute maxillary 07/29/2012   right  hx of sinus surgery  fu if needed recurrecne      Patient Active Problem List   Diagnosis Date Noted  . Family history of thyroid disease 10/04/2016  . Abnormal Papanicolaou smear of cervix with positive human papilloma virus (HPV) test 05/03/2016  . Diverticulitis of sigmoid colon 07/02/2014  . Situational anxiety 05/20/2014  . Elevated cholesterol 05/20/2014  . Menopausal and perimenopausal disorder  11/27/2013  . Medication management 11/27/2013  . Sciatica 11/27/2013  . Diverticulitis of colon - recurrent 10/02/2013  . Adjustment reaction with anxiety and depression 12/28/2011  . Preventive measure 12/28/2011  . BREAST MASS, RIGHT 07/21/2010  . HEMANGIOMA, HEPATIC 12/22/2008  . ADJUSTMENT DISORDER WITH DEPRESSED MOOD 12/22/2008  . SLEEP DISORDER/DISTURBANCE 12/22/2008  . Allergic rhinitis 05/27/2007  . GERD 05/27/2007  . HIATAL HERNIA 05/27/2007  . NEPHROLITHIASIS, HX OF 05/27/2007    Past Surgical History:  Procedure Laterality Date  . adnoidectomy    . COLONOSCOPY    . DILATATION & CURETTAGE/HYSTEROSCOPY WITH MYOSURE N/A 05/23/2016   Procedure: DILATATION & CURETTAGE/HYSTEROSCOPY WITH MYOSURE;  Surgeon: Terrance Mass, MD;  Location: [facility];  Service: Gynecology;  Laterality: N/A;  Request to post this for 12:30pm. That will leave room for a second major case for [place]. If we do not get a 2nd case in the next week I will move it up to follow. Thanks!  Needs one hour.  REQUEST FOR TINA RN AND AUDREY CST  . endoscopy with dialation    . NASAL SINUS SURGERY    . PROCTOSCOPY N/A 07/02/2014   Procedure: PROCTOSCOPY;  Surgeon: Michael Boston, MD;  Location: [facility];  Service: General;  Laterality: N/A;  . ROBOT ASSISTED LAPAROSCOPIC PARTIAL COLECTOMY    . TONSILLECTOMY    . ulnar nerve right arm    . UPPER GASTROINTESTINAL ENDOSCOPY  OB History    Gravida Para Term Preterm AB Living   2 2       2    SAB TAB Ectopic Multiple Live Births                   Home Medications    Prior to Admission medications   Medication Sig Start Date End Date Taking? Authorizing Provider  ALPRAZolam Duanne Moron) 0.25 MG tablet Take 0.25 mg by mouth daily as needed for anxiety.  04/05/16   [provider]  BIOTIN PO Take 1 capsule by mouth daily.    [provider]  Multiple Vitamin (MULTIVITAMIN) tablet Take 1 tablet by mouth every morning.     [provider]    Omega-3 Fatty Acids (FISH OIL PO) Take 1 capsule by mouth daily.    [provider]  OVER THE COUNTER MEDICATION somnapure natural sleep aid..  Melatonin, l-theanine, valerian root, hops extract, lemon balm, chamomile flower and passion flower    [provider]  Amesti - sleeping aid    [provider]  tretinoin (RETIN-A) 0.025 % cream apply to affected area at bedtime TO [place] 03/30/15   [provider]  Wheat Dextrin (BENEFIBER) POWD 1 tbsp daily 09/29/16   Gatha Mayer, MD  zolpidem (AMBIEN) 10 MG tablet Take 1 tablet (10 mg total) by mouth at bedtime as needed for sleep. 04/19/16 05/19/16  Terrance Mass, MD    Family History Family History  Problem Relation Age of Onset  . Lung cancer Mother   . Autism Son   . Colon cancer Maternal Uncle   . Heart disease Father   . Breast cancer Maternal Grandmother     Social History Social History   Tobacco Use  . Smoking status: Never Smoker  . Smokeless tobacco: Never Used  Substance Use Topics  . Alcohol use: Yes    Alcohol/week: 0.0 oz    Comment: Social Drinker--SEVERAL DRINKS A WEEK  . Drug use: No     Allergies   Shellfish allergy; Demerol [meperidine]; Amoxicillin-pot clavulanate; Meperidine hcl; and Oxycodone   Review of Systems Review of Systems  Constitutional: Positive for fatigue. Negative for activity change and fever.  Respiratory: Negative for shortness of breath.   Cardiovascular: Negative for chest pain.  Gastrointestinal: Positive for abdominal pain, diarrhea, nausea and vomiting.  All other systems reviewed and are negative.    Physical Exam Updated Vital Signs BP 126/87   Pulse 79   Resp 16   Ht 5\' 5"  (1.651 m)   Wt 49.9 kg (110 lb)   LMP 07/25/2011   SpO2 99%   BMI 18.30 kg/m   Physical Exam  Constitutional: She is oriented to person, place, and time. She appears well-developed and well-nourished.  HENT:   Head: Normocephalic and atraumatic.  <1 cm laceration to wet part of lip  Eyes: Right eye exhibits no discharge. Left eye exhibits no discharge.  Cardiovascular: Normal rate, regular rhythm and normal heart sounds.  No murmur heard. Pulmonary/Chest: Effort normal and breath sounds normal. She has no wheezes. She has no rales.  Abdominal: Soft. She exhibits no distension. There is tenderness.  Diffuse tenderness  Neurological: She is oriented to person, place, and time.  Skin: Skin is warm and dry. She is not diaphoretic.  Psychiatric: She has a normal mood and affect.  Nursing note and vitals reviewed.    ED Treatments / Results  Labs (all  labs ordered are listed, but only abnormal results are displayed) Labs Reviewed  COMPREHENSIVE METABOLIC PANEL - Abnormal; Notable for the following components:      Result Value   Glucose, Bld 118 (*)    Calcium 8.7 (*)    Total Protein 6.4 (*)    All other components within normal limits  CBC - Abnormal; Notable for the following components:   HCT 35.8 (*)    All other components within normal limits  URINALYSIS, ROUTINE W REFLEX MICROSCOPIC - Abnormal; Notable for the following components:   Color, Urine STRAW (*)    Hgb urine dipstick SMALL (*)    All other components within normal limits  LIPASE, BLOOD  TROPONIN I    EKG  EKG Interpretation  Date/Time:  Saturday October 06 2017 06:06:47 EDT Ventricular Rate:  80 PR Interval:  190 QRS Duration: 78 QT Interval:  402 QTC Calculation: 463 R Axis:   86 Text Interpretation:  Normal sinus rhythm Normal ECG Normal sinus rhythm Confirmed by Thomasene Lot, Shary Lamos [phone]) on 10/06/2017 8:54:45 AM       Radiology Ct Head Wo Contrast  Result Date: 10/06/2017 CLINICAL DATA:  Syncopal episode striking head. EXAM: CT HEAD WITHOUT CONTRAST TECHNIQUE: Contiguous axial images were obtained from the base of the skull through the vertex without intravenous contrast. COMPARISON:  None. FINDINGS:  Brain: No evidence for acute infarction, hemorrhage, mass lesion, hydrocephalus, or extra-axial fluid. Normal for age cerebral volume. No white matter disease. Vascular: No hyperdense vessel or unexpected calcification. Skull: Normal. Negative for fracture or focal lesion. Sinuses/Orbits: No acute finding. Other: None. IMPRESSION: Negative exam. Electronically Signed   By: Staci Righter M.D.   On: 10/06/2017 08:51   Ct Abdomen Pelvis W Contrast  Result Date: 10/06/2017 CLINICAL DATA:  Nausea, vomiting, and diarrhea for the past 2 days. EXAM: CT ABDOMEN AND PELVIS WITH CONTRAST TECHNIQUE: Multidetector CT imaging of the abdomen and pelvis was performed using the standard protocol following bolus administration of intravenous contrast. CONTRAST:  141mL ISOVUE-300 IOPAMIDOL (ISOVUE-300) INJECTION 61% COMPARISON:  CT abdomen and pelvis dated May 29, 2014. FINDINGS: Lower chest: No acute abnormality. Hepatobiliary: Two large hemangiomas within the liver appear to have slightly decreased in size. The larger hemangioma at the inferior aspect of segment 4B measures 6.0 x 4.5 x 6.5 cm, previously 6.8 x 5.0 x 7.1 cm. No new focal liver abnormality. The gallbladder is unremarkable. No biliary dilatation. Pancreas: Unremarkable. No pancreatic ductal dilatation or surrounding inflammatory changes. Spleen: Normal in size without focal abnormality. Adrenals/Urinary Tract: The adrenal glands are unremarkable. Slight interval increase in size of the right renal simple cyst, now measuring 1.2 cm, previously 1.0 cm. No renal or ureteral calculi. No hydronephrosis. The bladder is unremarkable. Stomach/Bowel: Small hiatal hernia. The stomach is otherwise within normal limits. Postsurgical changes related to interval partial left colectomy. There are few residual colonic diverticula just proximal to the anastomosis. Normal appendix. No bowel wall thickening, distention, or surrounding inflammatory changes. Vascular/Lymphatic: No  significant vascular findings are present. No enlarged abdominal or pelvic lymph nodes. Reproductive: Uterus and bilateral adnexa are unremarkable. Other: No abdominal wall hernia or abnormality. No abdominopelvic ascites. No pneumoperitoneum. Musculoskeletal: No acute or significant osseous findings. IMPRESSION: 1.  No acute intra-abdominal process. 2. Interval decrease in size of the two large hemangiomas within the liver. Electronically Signed   By: Titus Dubin M.D.   On: 10/06/2017 08:57    Procedures .Marland KitchenLaceration Repair Date/Time: 10/06/2017 9:58 AM Performed by: Zenovia Jarred  Lyn, MD Authorized by: Macarthur Critchley, MD   Consent:    Consent obtained:  Verbal Laceration details:    Location:  Lip   Lip location:  Upper interior lip   Length (cm):  1 Repair type:    Repair type:  Simple Exploration:    Wound extent: no muscle damage noted     Contaminated: no   Treatment:    Area cleansed with:  Betadine and saline   Amount of cleaning:  Standard   Visualized foreign bodies/material removed: no   Skin repair:    Repair method:  Sutures   Suture size:  5-0   Suture material:  Chromic gut   Suture technique:  Simple interrupted   Number of sutures:  1 Approximation:    Approximation:  Close   Vermilion border: well-aligned   Post-procedure details:    Dressing:  Antibiotic ointment   (including critical care time)  Medications Ordered in ED Medications  ondansetron (ZOFRAN-ODT) disintegrating tablet 8 mg (8 mg Oral Given 10/06/17 0558)  sodium chloride 0.9 % bolus 1,000 mL (0 mLs Intravenous Stopped 10/06/17 0843)  gi cocktail (Maalox,Lidocaine,Donnatal) (30 mLs Oral Given 10/06/17 0743)  Tdap (BOOSTRIX) injection 0.5 mL (0.5 mLs Intramuscular Given 10/06/17 0743)  lidocaine (PF) (XYLOCAINE) 1 % injection 5 mL (5 mLs Intradermal Given by Other 10/06/17 0745)  iopamidol (ISOVUE-300) 61 % injection (100 mLs  Contrast Given 10/06/17 0810)  sodium chloride 0.9 %  bolus 1,000 mL (1,000 mLs Intravenous New Bag/Given 10/06/17 1015)     Initial Impression / Assessment and Plan / ED Course  I have reviewed the triage vital signs and the nursing notes.  Pertinent labs & imaging results that were available during my care of the patient were reviewed by me and considered in my medical decision making (see chart for details).     Patient is a 57 year old female presenting with nausea vomiting diarrhea.  Patient reports that on Tuesday she went to [place], she was hardly able to make at home until before her diarrhea started.  She reports 2-3 days of this intense diarrhea and vomiting.  Patient began to feel better yesterday.  However when she got up to go to the bathroom this morning she syncopized while the toilet.  Striking her head.  Patient sustained small laceration to her lip.  Patient's past medical history significant for colon resection after complicated diverticulitis.  7:33 AM Will get CT of abdomen given the severity and length of symptoms.  Will give fluids, check electrolytes.  9:59 AM Long discussion about repair of laceration.  Appears that she has lost a chunk of skin from likely tooth injury to lip.  Will bring 2 lips close together.  Will likely have some scar given the chunk that is missing.  Discussed that she may need to follow-up with plastic surgery.  3-hour troponin is negative.  We will have her follow-up with primary care physician.  We will treat her for postinfectious gastritis.  I suspect that patient's Bojangles caused acute enteritis and now she has a post enteritis gastritis.  Will treat with PPIs, follow-up with primary care physician. BRAT diet discussed.   Patietn taking PO, normal vitals at time of discharge.   Final Clinical Impressions(s) / ED Diagnoses   Final diagnoses:  None    ED Discharge Orders    None       Kyani Simkin, Fredia Sorrow, MD 10/06/17 1135

## 2017-10-06 NOTE — Discharge Instructions (Signed)
We think that you likely had enteritis from the [employer] that you ate. We now think that you have developed a gastritis.  Please use the omeprazole every day to help treat the gastritis.  In addition you can use Carafate to help with symptoms.  Please return immediately with any concerns, change in symptoms or other issues.

## 2017-10-06 NOTE — ED Notes (Signed)
Patient provided with cranberry juice, per patient request.

## 2017-10-06 NOTE — ED Notes (Signed)
Transported to CT by CT tech.

## 2017-10-06 NOTE — ED Notes (Signed)
Patient transported to CT 

## 2017-11-06 NOTE — Progress Notes (Signed)
Chief Complaint  Patient presents with  . Annual Exam    No new concerns - recent ED visit for diverticulitis    HPI: Patient  Jessica Chan  57 y.o. comes in today for Preventive Health Care visit   Took   Alprazolam  during    Stress ful time   Kitchen remodel that took 6 mos complications      Is off wellbutrin and dosing ok  Doing better now   Sleep better  ocass 2.5 ambine middle of night  But using melatonin .   Food pooisining episode  from   bojangles   And  Was better and then  Fainted and had lip laceration  See ct scan head and abd   Is fine now    Health Maintenance  Topic Date Due  . Hepatitis C Screening  12/10/1960  . HIV Screening  09/05/1975  . INFLUENZA VACCINE  02/21/2018  . PAP SMEAR  05/04/2019  . MAMMOGRAM  05/19/2019  . COLONOSCOPY  01/28/2020  . TETANUS/TDAP  10/07/2027   Health Maintenance Review LIFESTYLE:  Exercise:   hihg  aciticty  Tobacco/ETS: no Alcohol:   1 per day  Or [address]   Sugar beverages: no Sleep: getting better    Chewable melatonin   Helping some.    sometims take  Par Liz Malady but  Not a lot .  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 GI /GU: No adominal pain, vomiting, change in bowel habits. No blood in the stool. No significant GU symptoms. SKIN/HEME: ,no acute skin rashes suspicious lesions or bleeding. No lymphadenopathy, nodules, masses.  NEURO/ PSYCH:  No neurologic signs such as weakness numbness. No depression anxiety. IMM/ Allergy: No unusual infections.  Allergy .   REST of 12 system review negative except as per HPI   Past Medical History:  Diagnosis Date  . Allergic rhinitis   . Anxiety   . Anxiety disorder   . BLEPHARITIS 05/27/2007   Qualifier: Diagnosis of  By: Hulan Saas, CMA (AAMA),  Quita Skye   . Depression   . Diverticulitis   . Diverticulitis of colon - recurrent 10/02/2013  . Diverticulosis   . ESOPHAGEAL STRICTURE 11/15/2009   Qualifier: Diagnosis of  By: Trellis Paganini PA-c, Amy S   . GERD (gastroesophageal reflux disease)   . Hemangioma of liver    incidental finding-large, right and left lobes of liver   . Hiatal hernia   . IBS (irritable bowel syndrome)   . PONV (postoperative nausea and vomiting)    SEVERE NAUSEA & VOMITING  . Sinusitis, acute maxillary 07/29/2012   right  hx of sinus surgery  fu if needed recurrecne      Past Surgical History:  Procedure Laterality Date  . adnoidectomy    . COLONOSCOPY    . DILATATION & CURETTAGE/HYSTEROSCOPY WITH MYOSURE N/A 05/23/2016   Procedure: DILATATION & CURETTAGE/HYSTEROSCOPY WITH MYOSURE;  Surgeon: Terrance Mass, MD;  Location: [facility];  Service: Gynecology;  Laterality: N/A;  Request to post this for 12:30pm. That will leave room for a second major case for [place]. If we do not get a 2nd case in the next week I will move it up to follow. Thanks!  Needs one hour.  REQUEST FOR TINA RN AND AUDREY CST  . endoscopy with dialation    . NASAL SINUS SURGERY    . PROCTOSCOPY N/A 07/02/2014   Procedure: PROCTOSCOPY;  Surgeon: Michael Boston, MD;  Location: [facility];  Service: General;  Laterality: N/A;  . ROBOT ASSISTED LAPAROSCOPIC PARTIAL COLECTOMY    . TONSILLECTOMY    . ulnar nerve right arm    . UPPER GASTROINTESTINAL ENDOSCOPY      Family History  Problem Relation Age of Onset  . Lung cancer Mother   . Autism Son   . Colon cancer Maternal Uncle   . Heart disease Father   . Breast cancer Maternal Grandmother     Social History   Socioeconomic History  . Marital status: Married    Spouse name: Not on file  . Number of children: 2  . Years of education: Not on file  . Highest education level: Not on file  Occupational History  . Occupation: Physiological scientist: UNEMPLOYED  Social Needs  . Financial  resource strain: Not on file  . Food insecurity:    Worry: Not on file    Inability: Not on file  . Transportation needs:    Medical: Not on file    Non-medical: Not on file  Tobacco Use  . Smoking status: Never Smoker  . Smokeless tobacco: Never Used  Substance and Sexual Activity  . Alcohol use: Yes    Alcohol/week: 0.0 oz    Comment: Social Drinker--SEVERAL DRINKS A WEEK  . Drug use: No  . Sexual activity: Yes    Partners: Male    Comment: 1st intercourse- 12, partners- 5 married- 33 yrs  Lifestyle  . Physical activity:    Days per week: Not on file    Minutes per session: Not on file  . Stress: Not on file  Relationships  . Social connections:    Talks on phone: Not on file    Gets together: Not on file    Attends religious service: Not on file    Active member of club or organization: Not on file    Attends meetings of clubs or organizations: Not on file    Relationship status: Not on file  Other Topics Concern  . Not on file  Social History Narrative   Married HHof 4 one sone autistic   Husband had prostate cancer in last few years   Some exercise  Runs    No tobacco    Outpatient Medications Prior to Visit  Medication Sig Dispense Refill  . BIOTIN PO Take 1 capsule by mouth daily.    . Multiple Vitamin (MULTIVITAMIN) tablet Take 1 tablet by mouth every morning.     . Omega-3 Fatty Acids (FISH OIL PO) Take 1 capsule by mouth daily.    Marland Kitchen omeprazole (PRILOSEC) 20 MG capsule Take 1 capsule (20 mg total) by mouth daily. 30 capsule 0  . ondansetron (ZOFRAN ODT) 4 MG disintegrating tablet Take 1 tablet (4 mg total) by mouth every 8 (eight) hours as needed for nausea or vomiting. 10 tablet 0  . OVER THE COUNTER MEDICATION somnapure natural sleep aid..  Melatonin, l-theanine, valerian root, hops extract, lemon balm, chamomile flower and passion flower    . OVER THE COUNTER MEDICATION Somnapure - sleeping aid    . sucralfate (CARAFATE) 1 GM/10ML suspension Take 10 mLs  (1 g total) by mouth 4 (four) times daily -  with meals and at bedtime. 420 mL  0  . tretinoin (RETIN-A) 0.025 % cream apply to affected area at bedtime TO DRY SKIN FOR ACNE  0  . Wheat Dextrin (BENEFIBER) POWD 1 tbsp daily  0  . ALPRAZolam (XANAX) 0.25 MG tablet Take 0.25 mg by mouth daily as needed for anxiety.     Marland Kitchen zolpidem (AMBIEN) 10 MG tablet Take 1 tablet (10 mg total) by mouth at bedtime as needed for sleep. 30 tablet 0   No facility-administered medications prior to visit.      EXAM:  BP 98/68 (BP Location: Right Arm, Patient Position: Sitting, Cuff Size: Normal)   Pulse 60   Temp 97.8 F (36.6 C) (Oral)   Ht 5' 4.25" (1.632 m)   Wt 109 lb 4.8 oz (49.6 kg)   LMP 07/25/2011   BMI 18.62 kg/m   Body mass index is 18.62 kg/m. Wt Readings from Last 3 Encounters:  11/07/17 109 lb 4.8 oz (49.6 kg)  10/06/17 110 lb (49.9 kg)  06/21/17 109 lb (49.4 kg)    Physical Exam: Vital signs reviewed VVO:HYWV is a well-developed well-nourished alert cooperative    who appearsr stated age in no acute distress.  HEENT: normocephalic atraumatic , Eyes: PERRL EOM's full, conjunctiva clear, Nares: paten,t no deformity discharge or tenderness., Ears: no deformity EAC's clear TMs with normal landmarks. Mouth: clear OP, no lesions, edema.  Moist mucous membranes. Dentition in adequate repair. NECK: supple without masses, thyromegaly or bruits. CHEST/PULM:  Clear to auscultation and percussion breath sounds equal no wheeze , rales or rhonchi. No chest wall deformities or tenderness. Breast: normal by inspection . No dimpling, discharge, masses, tenderness or discharge . CV: PMI is nondisplaced, S1 S2 no gallops, murmurs, rubs. Peripheral pulses are full without delay.No JVD .  ABDOMEN: Bowel sounds normal nontender  No guard or rebound, no hepato splenomegal no CVA tenderness.  Well healed   Scar abd  Extremtities:  No clubbing cyanosis or edema, no acute joint swelling or redness no focal  atrophy NEURO:  Oriented x3, cranial nerves 3-12 appear to be intact, no obvious focal weakness,gait within normal limits no abnormal reflexes or asymmetrical SKIN: No acute rashes normal turgor, color, no bruising or petechiae. PSYCH: Oriented, good eye contact, no obvious depression anxiety, cognition and judgment appear normal. LN: no cervical axillary inguinal adenopathy  Lab Results  Component Value Date   WBC 5.4 10/06/2017   HGB 12.0 10/06/2017   HCT 35.8 (L) 10/06/2017   PLT 174 10/06/2017   GLUCOSE 118 (H) 10/06/2017   CHOL 208 (H) 09/27/2016   TRIG 146.0 09/27/2016   HDL 56.80 09/27/2016   LDLCALC 122 (H) 09/27/2016   ALT 14 10/06/2017   AST 24 10/06/2017   NA 140 10/06/2017   K 3.8 10/06/2017   CL 104 10/06/2017   CREATININE 0.71 10/06/2017   BUN 15 10/06/2017   CO2 24 10/06/2017   TSH 4.17 09/27/2016   HGBA1C 5.1 06/25/2014    BP Readings from Last 3 Encounters:  11/07/17 98/68  10/06/17 109/63  06/21/17 122/76    Ct scan  reviewed with patient   ASSESSMENT AND PLAN:  Discussed the following assessment and plan:  Visit for preventive health examination - Plan: Lipid panel, TSH, CBC with Differential/Platelet, Hepatic function panel, Hemoglobin A1c, T4, free, Basic metabolic panel  Medication management - Plan: Lipid panel, TSH, CBC with Differential/Platelet, Hepatic function panel, Hemoglobin A1c, T4, free, Basic metabolic panel  Family history of thyroid disease - Plan: Lipid panel, TSH, CBC with  Differential/Platelet, Hepatic function panel, Hemoglobin A1c, T4, free, Basic metabolic panel  Hyperlipidemia, unspecified hyperlipidemia type - Plan: Lipid panel, TSH, CBC with Differential/Platelet, Hepatic function panel, Hemoglobin A1c, T4, free, Basic metabolic panel  Elevated blood sugar - Plan: T4, free, Basic metabolic panel  Insomnia, unspecified type Lipids A1c labs today  Last bg prob up from illness  Will recheck  Following thyroid with hx abn  and fam hx  Cautious use of alpraz in past given but  Gyn  To travel   Will disp 20  Risk benefit of medication discussed.  Doing better   Patient Care Team: Panosh, Standley Brooking, MD as PCP - General Carlean Purl Ofilia Neas, MD as Attending Physician (Gastroenterology) Patient Instructions  Glad you are doing better .   Continue lifestyle intervention healthy eating and exercise . Cautious use of    Alprazolam  .  Health Maintenance, Female Adopting a healthy lifestyle and getting preventive care can go a long way to promote health and wellness. Talk with your health care provider about what schedule of regular examinations is right for you. This is a good chance for you to check in with your provider about disease prevention and staying healthy. In between checkups, there are plenty of things you can do on your own. Experts have done a lot of research about which lifestyle changes and preventive measures are most likely to keep you healthy. Ask your health care provider for more information. Weight and diet Eat a healthy diet  Be sure to include plenty of vegetables, fruits, low-fat dairy products, and lean protein.  Do not eat a lot of foods high in solid fats, added sugars, or salt.  Get regular exercise. This is one of the most important things you can do for your health. ? Most adults should exercise for at least 150 minutes each week. The exercise should increase your heart rate and make you sweat (moderate-intensity exercise). ? Most adults should also do strengthening exercises at least twice a week. This is in addition to the moderate-intensity exercise.  Maintain a healthy weight  Body mass index (BMI) is a measurement that can be used to identify possible weight problems. It estimates body fat based on height and weight. Your health care provider can help determine your BMI and help you achieve or maintain a healthy weight.  For females 85 years of age and older: ? A BMI below 18.5 is  considered underweight. ? A BMI of 18.5 to 24.9 is normal. ? A BMI of 25 to 29.9 is considered overweight. ? A BMI of 30 and above is considered obese.  Watch levels of cholesterol and blood lipids  You should start having your blood tested for lipids and cholesterol at 57 years of age, then have this test every 5 years.  You may need to have your cholesterol levels checked more often if: ? Your lipid or cholesterol levels are high. ? You are older than 57 years of age. ? You are at high risk for heart disease.  Cancer screening Lung Cancer  Lung cancer screening is recommended for adults 12-42 years old who are at high risk for lung cancer because of a history of smoking.  A yearly low-dose CT scan of the lungs is recommended for people who: ? Currently smoke. ? Have quit within the past 15 years. ? Have at least a 30-pack-year history of smoking. A pack year is smoking an average of one pack of cigarettes a day for 1 year.  Yearly screening should continue until it has been 15 years since you quit.  Yearly screening should stop if you develop a health problem that would prevent you from having lung cancer treatment.  Breast Cancer  Practice breast self-awareness. This means understanding how your breasts normally appear and feel.  It also means doing regular breast self-exams. Let your health care provider know about any changes, no matter how small.  If you are in your 20s or 30s, you should have a clinical breast exam (CBE) by a health care provider every 1-3 years as part of a regular health exam.  If you are 66 or older, have a CBE every year. Also consider having a breast X-ray (mammogram) every year.  If you have a family history of breast cancer, talk to your health care provider about genetic screening.  If you are at high risk for breast cancer, talk to your health care provider about having an MRI and a mammogram every year.  Breast cancer gene (BRCA) assessment  is recommended for women who have family members with BRCA-related cancers. BRCA-related cancers include: ? Breast. ? Ovarian. ? Tubal. ? Peritoneal cancers.  Results of the assessment will determine the need for genetic counseling and BRCA1 and BRCA2 testing.  Cervical Cancer Your health care provider may recommend that you be screened regularly for cancer of the pelvic organs (ovaries, uterus, and vagina). This screening involves a pelvic examination, including checking for microscopic changes to the surface of your cervix (Pap test). You may be encouraged to have this screening done every 3 years, beginning at age 86.  For women ages 50-65, health care providers may recommend pelvic exams and Pap testing every 3 years, or they may recommend the Pap and pelvic exam, combined with testing for human papilloma virus (HPV), every 5 years. Some types of HPV increase your risk of cervical cancer. Testing for HPV may also be done on women of any age with unclear Pap test results.  Other health care providers may not recommend any screening for nonpregnant women who are considered low risk for pelvic cancer and who do not have symptoms. Ask your health care provider if a screening pelvic exam is right for you.  If you have had past treatment for cervical cancer or a condition that could lead to cancer, you need Pap tests and screening for cancer for at least 20 years after your treatment. If Pap tests have been discontinued, your risk factors (such as having a new sexual partner) need to be reassessed to determine if screening should resume. Some women have medical problems that increase the chance of getting cervical cancer. In these cases, your health care provider may recommend more frequent screening and Pap tests.  Colorectal Cancer  This type of cancer can be detected and often prevented.  Routine colorectal cancer screening usually begins at 57 years of age and continues through 57 years of  age.  Your health care provider may recommend screening at an earlier age if you have risk factors for colon cancer.  Your health care provider may also recommend using home test kits to check for hidden blood in the stool.  A small camera at the end of a tube can be used to examine your colon directly (sigmoidoscopy or colonoscopy). This is done to check for the earliest forms of colorectal cancer.  Routine screening usually begins at age 85.  Direct examination of the colon should be repeated every 5-10 years through 57 years of age.  However, you may need to be screened more often if early forms of precancerous polyps or small growths are found.  Skin Cancer  Check your skin from head to toe regularly.  Tell your health care provider about any new moles or changes in moles, especially if there is a change in a mole's shape or color.  Also tell your health care provider if you have a mole that is larger than the size of a pencil eraser.  Always use sunscreen. Apply sunscreen liberally and repeatedly throughout the day.  Protect yourself by wearing long sleeves, pants, a wide-brimmed hat, and sunglasses whenever you are outside.  Heart disease, diabetes, and high blood pressure  High blood pressure causes heart disease and increases the risk of stroke. High blood pressure is more likely to develop in: ? People who have blood pressure in the high end of the normal range (130-139/85-89 mm Hg). ? People who are overweight or obese. ? People who are African American.  If you are 47-30 years of age, have your blood pressure checked every 3-5 years. If you are 16 years of age or older, have your blood pressure checked every year. You should have your blood pressure measured twice-once when you are at a hospital or clinic, and once when you are not at a hospital or clinic. Record the average of the two measurements. To check your blood pressure when you are not at a hospital or clinic, you  can use: ? An automated blood pressure machine at a pharmacy. ? A home blood pressure monitor.  If you are between 5 years and 30 years old, ask your health care provider if you should take aspirin to prevent strokes.  Have regular diabetes screenings. This involves taking a blood sample to check your fasting blood sugar level. ? If you are at a normal weight and have a low risk for diabetes, have this test once every three years after 57 years of age. ? If you are overweight and have a high risk for diabetes, consider being tested at a younger age or more often. Preventing infection Hepatitis B  If you have a higher risk for hepatitis B, you should be screened for this virus. You are considered at high risk for hepatitis B if: ? You were born in a country where hepatitis B is common. Ask your health care provider which countries are considered high risk. ? Your parents were born in a high-risk country, and you have not been immunized against hepatitis B (hepatitis B vaccine). ? You have HIV or AIDS. ? You use needles to inject street drugs. ? You live with someone who has hepatitis B. ? You have had sex with someone who has hepatitis B. ? You get hemodialysis treatment. ? You take certain medicines for conditions, including cancer, organ transplantation, and autoimmune conditions.  Hepatitis C  Blood testing is recommended for: ? Everyone born from [birth year] through 1965. ? Anyone with known risk factors for hepatitis C.  Sexually transmitted infections (STIs)  You should be screened for sexually transmitted infections (STIs) including gonorrhea and chlamydia if: ? You are sexually active and are younger than 57 years of age. ? You are older than 57 years of age and your health care provider tells you that you are at risk for this type of infection. ? Your sexual activity has changed since you were last screened and you are at an increased risk for chlamydia or gonorrhea. Ask your  health care provider if  you are at risk.  If you do not have HIV, but are at risk, it may be recommended that you take a prescription medicine daily to prevent HIV infection. This is called pre-exposure prophylaxis (PrEP). You are considered at risk if: ? You are sexually active and do not regularly use condoms or know the HIV status of your partner(s). ? You take drugs by injection. ? You are sexually active with a partner who has HIV.  Talk with your health care provider about whether you are at high risk of being infected with HIV. If you choose to begin PrEP, you should first be tested for HIV. You should then be tested every 3 months for as long as you are taking PrEP. Pregnancy  If you are premenopausal and you may become pregnant, ask your health care provider about preconception counseling.  If you may become pregnant, take 400 to 800 micrograms (mcg) of folic acid every day.  If you want to prevent pregnancy, talk to your health care provider about birth control (contraception). Osteoporosis and menopause  Osteoporosis is a disease in which the bones lose minerals and strength with aging. This can result in serious bone fractures. Your risk for osteoporosis can be identified using a bone density scan.  If you are 34 years of age or older, or if you are at risk for osteoporosis and fractures, ask your health care provider if you should be screened.  Ask your health care provider whether you should take a calcium or vitamin D supplement to lower your risk for osteoporosis.  Menopause may have certain physical symptoms and risks.  Hormone replacement therapy may reduce some of these symptoms and risks. Talk to your health care provider about whether hormone replacement therapy is right for you. Follow these instructions at home:  Schedule regular health, dental, and eye exams.  Stay current with your immunizations.  Do not use any tobacco products including cigarettes, chewing  tobacco, or electronic cigarettes.  If you are pregnant, do not drink alcohol.  If you are breastfeeding, limit how much and how often you drink alcohol.  Limit alcohol intake to no more than 1 drink per day for nonpregnant women. One drink equals 12 ounces of beer, 5 ounces of wine, or 1 ounces of hard liquor.  Do not use street drugs.  Do not share needles.  Ask your health care provider for help if you need support or information about quitting drugs.  Tell your health care provider if you often feel depressed.  Tell your health care provider if you have ever been abused or do not feel safe at home. This information is not intended to replace advice given to you by your health care provider. Make sure you discuss any questions you have with your health care provider. Document Released: 01/23/2011 Document Revised: 12/16/2015 Document Reviewed: 04/13/2015 Elsevier Interactive Patient Education  2018 [employer]. Panosh M.D.

## 2017-11-07 ENCOUNTER — Encounter: Payer: Self-pay | Admitting: Internal Medicine

## 2017-11-07 ENCOUNTER — Ambulatory Visit (INDEPENDENT_AMBULATORY_CARE_PROVIDER_SITE_OTHER): Payer: BLUE CROSS/BLUE SHIELD | Admitting: Internal Medicine

## 2017-11-07 VITALS — BP 98/68 | HR 60 | Temp 97.8°F | Ht 64.25 in | Wt 109.3 lb

## 2017-11-07 DIAGNOSIS — G4700 Insomnia, unspecified: Secondary | ICD-10-CM

## 2017-11-07 DIAGNOSIS — E785 Hyperlipidemia, unspecified: Secondary | ICD-10-CM | POA: Diagnosis not present

## 2017-11-07 DIAGNOSIS — R739 Hyperglycemia, unspecified: Secondary | ICD-10-CM | POA: Diagnosis not present

## 2017-11-07 DIAGNOSIS — Z8349 Family history of other endocrine, nutritional and metabolic diseases: Secondary | ICD-10-CM | POA: Diagnosis not present

## 2017-11-07 DIAGNOSIS — Z0000 Encounter for general adult medical examination without abnormal findings: Secondary | ICD-10-CM

## 2017-11-07 DIAGNOSIS — Z79899 Other long term (current) drug therapy: Secondary | ICD-10-CM | POA: Diagnosis not present

## 2017-11-07 LAB — CBC WITH DIFFERENTIAL/PLATELET
BASOS ABS: 0 10*3/uL (ref 0.0–0.1)
Basophils Relative: 0.6 % (ref 0.0–3.0)
EOS ABS: 0.1 10*3/uL (ref 0.0–0.7)
Eosinophils Relative: 1.8 % (ref 0.0–5.0)
HCT: 38.5 % (ref 36.0–46.0)
Hemoglobin: 13.1 g/dL (ref 12.0–15.0)
LYMPHS ABS: 1.5 10*3/uL (ref 0.7–4.0)
Lymphocytes Relative: 33.9 % (ref 12.0–46.0)
MCHC: 34 g/dL (ref 30.0–36.0)
MCV: 92.8 fl (ref 78.0–100.0)
MONOS PCT: 8 % (ref 3.0–12.0)
Monocytes Absolute: 0.3 10*3/uL (ref 0.1–1.0)
NEUTROS ABS: 2.4 10*3/uL (ref 1.4–7.7)
NEUTROS PCT: 55.7 % (ref 43.0–77.0)
PLATELETS: 215 10*3/uL (ref 150.0–400.0)
RBC: 4.14 Mil/uL (ref 3.87–5.11)
RDW: 12.9 % (ref 11.5–15.5)
WBC: 4.4 10*3/uL (ref 4.0–10.5)

## 2017-11-07 LAB — BASIC METABOLIC PANEL
BUN: 17 mg/dL (ref 6–23)
CO2: 30 mEq/L (ref 19–32)
Calcium: 9.4 mg/dL (ref 8.4–10.5)
Chloride: 103 mEq/L (ref 96–112)
Creatinine, Ser: 0.7 mg/dL (ref 0.40–1.20)
GFR: 91.61 mL/min (ref >60.00)
GLUCOSE: 82 mg/dL (ref 70–99)
POTASSIUM: 4.4 meq/L (ref 3.5–5.1)
SODIUM: 142 meq/L (ref 135–145)

## 2017-11-07 LAB — LIPID PANEL
CHOL/HDL RATIO: 3
Cholesterol: 198 mg/dL (ref 0–200)
HDL: 73.2 mg/dL (ref >39.00)
LDL Cholesterol: 113 mg/dL — ABNORMAL HIGH (ref 0–99)
NONHDL: 124.74
Triglycerides: 57 mg/dL (ref 0.0–149.0)
VLDL: 11.4 mg/dL (ref 0.0–40.0)

## 2017-11-07 LAB — HEPATIC FUNCTION PANEL
ALBUMIN: 4.8 g/dL (ref 3.5–5.2)
ALK PHOS: 57 U/L (ref 39–117)
ALT: 9 U/L (ref 0–35)
AST: 17 U/L (ref 0–37)
BILIRUBIN DIRECT: 0.1 mg/dL (ref 0.0–0.3)
BILIRUBIN TOTAL: 0.7 mg/dL (ref 0.2–1.2)
Total Protein: 7 g/dL (ref 6.0–8.3)

## 2017-11-07 LAB — TSH: TSH: 3.93 u[IU]/mL (ref 0.35–4.50)

## 2017-11-07 LAB — HEMOGLOBIN A1C: Hgb A1c MFr Bld: 5 % (ref 4.6–6.5)

## 2017-11-07 LAB — T4, FREE: Free T4: 0.75 ng/dL (ref 0.60–1.60)

## 2017-11-07 MED ORDER — ALPRAZOLAM 0.25 MG PO TABS: 0.2500 mg | ORAL_TABLET | Freq: Every day | ORAL | 0 refills | Status: DC | PRN

## 2017-11-07 NOTE — Patient Instructions (Signed)
Glad you are doing better .   Continue lifestyle intervention healthy eating and exercise . Cautious use of    Alprazolam  .  Health Maintenance, Female Adopting a healthy lifestyle and getting preventive care can go a long way to promote health and wellness. Talk with your health care provider about what schedule of regular examinations is right for you. This is a good chance for you to check in with your provider about disease prevention and staying healthy. In between checkups, there are plenty of things you can do on your own. Experts have done a lot of research about which lifestyle changes and preventive measures are most likely to keep you healthy. Ask your health care provider for more information. Weight and diet Eat a healthy diet  Be sure to include plenty of vegetables, fruits, low-fat dairy products, and lean protein.  Do not eat a lot of foods high in solid fats, added sugars, or salt.  Get regular exercise. This is one of the most important things you can do for your health. ? Most adults should exercise for at least 150 minutes each week. The exercise should increase your heart rate and make you sweat (moderate-intensity exercise). ? Most adults should also do strengthening exercises at least twice a week. This is in addition to the moderate-intensity exercise.  Maintain a healthy weight  Body mass index (BMI) is a measurement that can be used to identify possible weight problems. It estimates body fat based on height and weight. Your health care provider can help determine your BMI and help you achieve or maintain a healthy weight.  For females 74 years of age and older: ? A BMI below 18.5 is considered underweight. ? A BMI of 18.5 to 24.9 is normal. ? A BMI of 25 to 29.9 is considered overweight. ? A BMI of 30 and above is considered obese.  Watch levels of cholesterol and blood lipids  You should start having your blood tested for lipids and cholesterol at 57 years  of age, then have this test every 5 years.  You may need to have your cholesterol levels checked more often if: ? Your lipid or cholesterol levels are high. ? You are older than 57 years of age. ? You are at high risk for heart disease.  Cancer screening Lung Cancer  Lung cancer screening is recommended for adults 16-2 years old who are at high risk for lung cancer because of a history of smoking.  A yearly low-dose CT scan of the lungs is recommended for people who: ? Currently smoke. ? Have quit within the past 15 years. ? Have at least a 30-pack-year history of smoking. A pack year is smoking an average of one pack of cigarettes a day for 1 year.  Yearly screening should continue until it has been 15 years since you quit.  Yearly screening should stop if you develop a health problem that would prevent you from having lung cancer treatment.  Breast Cancer  Practice breast self-awareness. This means understanding how your breasts normally appear and feel.  It also means doing regular breast self-exams. Let your health care provider know about any changes, no matter how small.  If you are in your 20s or 30s, you should have a clinical breast exam (CBE) by a health care provider every 1-3 years as part of a regular health exam.  If you are 6 or older, have a CBE every year. Also consider having a breast X-ray (mammogram) every year.  If you have a family history of breast cancer, talk to your health care provider about genetic screening.  If you are at high risk for breast cancer, talk to your health care provider about having an MRI and a mammogram every year.  Breast cancer gene (BRCA) assessment is recommended for women who have family members with BRCA-related cancers. BRCA-related cancers include: ? Breast. ? Ovarian. ? Tubal. ? Peritoneal cancers.  Results of the assessment will determine the need for genetic counseling and BRCA1 and BRCA2 testing.  Cervical  Cancer Your health care provider may recommend that you be screened regularly for cancer of the pelvic organs (ovaries, uterus, and vagina). This screening involves a pelvic examination, including checking for microscopic changes to the surface of your cervix (Pap test). You may be encouraged to have this screening done every 3 years, beginning at age 28.  For women ages 42-65, health care providers may recommend pelvic exams and Pap testing every 3 years, or they may recommend the Pap and pelvic exam, combined with testing for human papilloma virus (HPV), every 5 years. Some types of HPV increase your risk of cervical cancer. Testing for HPV may also be done on women of any age with unclear Pap test results.  Other health care providers may not recommend any screening for nonpregnant women who are considered low risk for pelvic cancer and who do not have symptoms. Ask your health care provider if a screening pelvic exam is right for you.  If you have had past treatment for cervical cancer or a condition that could lead to cancer, you need Pap tests and screening for cancer for at least 20 years after your treatment. If Pap tests have been discontinued, your risk factors (such as having a new sexual partner) need to be reassessed to determine if screening should resume. Some women have medical problems that increase the chance of getting cervical cancer. In these cases, your health care provider may recommend more frequent screening and Pap tests.  Colorectal Cancer  This type of cancer can be detected and often prevented.  Routine colorectal cancer screening usually begins at 57 years of age and continues through 57 years of age.  Your health care provider may recommend screening at an earlier age if you have risk factors for colon cancer.  Your health care provider may also recommend using home test kits to check for hidden blood in the stool.  A small camera at the end of a tube can be used to  examine your colon directly (sigmoidoscopy or colonoscopy). This is done to check for the earliest forms of colorectal cancer.  Routine screening usually begins at age 21.  Direct examination of the colon should be repeated every 5-10 years through 57 years of age. However, you may need to be screened more often if early forms of precancerous polyps or small growths are found.  Skin Cancer  Check your skin from head to toe regularly.  Tell your health care provider about any new moles or changes in moles, especially if there is a change in a mole's shape or color.  Also tell your health care provider if you have a mole that is larger than the size of a pencil eraser.  Always use sunscreen. Apply sunscreen liberally and repeatedly throughout the day.  Protect yourself by wearing long sleeves, pants, a wide-brimmed hat, and sunglasses whenever you are outside.  Heart disease, diabetes, and high blood pressure  High blood pressure causes heart disease and  increases the risk of stroke. High blood pressure is more likely to develop in: ? People who have blood pressure in the high end of the normal range (130-139/85-89 mm Hg). ? People who are overweight or obese. ? People who are African American.  If you are 73-54 years of age, have your blood pressure checked every 3-5 years. If you are 23 years of age or older, have your blood pressure checked every year. You should have your blood pressure measured twice-once when you are at a hospital or clinic, and once when you are not at a hospital or clinic. Record the average of the two measurements. To check your blood pressure when you are not at a hospital or clinic, you can use: ? An automated blood pressure machine at a pharmacy. ? A home blood pressure monitor.  If you are between 67 years and 20 years old, ask your health care provider if you should take aspirin to prevent strokes.  Have regular diabetes screenings. This involves taking a  blood sample to check your fasting blood sugar level. ? If you are at a normal weight and have a low risk for diabetes, have this test once every three years after 57 years of age. ? If you are overweight and have a high risk for diabetes, consider being tested at a younger age or more often. Preventing infection Hepatitis B  If you have a higher risk for hepatitis B, you should be screened for this virus. You are considered at high risk for hepatitis B if: ? You were born in a country where hepatitis B is common. Ask your health care provider which countries are considered high risk. ? Your parents were born in a high-risk country, and you have not been immunized against hepatitis B (hepatitis B vaccine). ? You have HIV or AIDS. ? You use needles to inject street drugs. ? You live with someone who has hepatitis B. ? You have had sex with someone who has hepatitis B. ? You get hemodialysis treatment. ? You take certain medicines for conditions, including cancer, organ transplantation, and autoimmune conditions.  Hepatitis C  Blood testing is recommended for: ? Everyone born from [birth year] through 1965. ? Anyone with known risk factors for hepatitis C.  Sexually transmitted infections (STIs)  You should be screened for sexually transmitted infections (STIs) including gonorrhea and chlamydia if: ? You are sexually active and are younger than 57 years of age. ? You are older than 57 years of age and your health care provider tells you that you are at risk for this type of infection. ? Your sexual activity has changed since you were last screened and you are at an increased risk for chlamydia or gonorrhea. Ask your health care provider if you are at risk.  If you do not have HIV, but are at risk, it may be recommended that you take a prescription medicine daily to prevent HIV infection. This is called pre-exposure prophylaxis (PrEP). You are considered at risk if: ? You are sexually active and  do not regularly use condoms or know the HIV status of your partner(s). ? You take drugs by injection. ? You are sexually active with a partner who has HIV.  Talk with your health care provider about whether you are at high risk of being infected with HIV. If you choose to begin PrEP, you should first be tested for HIV. You should then be tested every 3 months for as long as you are taking  PrEP. Pregnancy  If you are premenopausal and you may become pregnant, ask your health care provider about preconception counseling.  If you may become pregnant, take 400 to 800 micrograms (mcg) of folic acid every day.  If you want to prevent pregnancy, talk to your health care provider about birth control (contraception). Osteoporosis and menopause  Osteoporosis is a disease in which the bones lose minerals and strength with aging. This can result in serious bone fractures. Your risk for osteoporosis can be identified using a bone density scan.  If you are 45 years of age or older, or if you are at risk for osteoporosis and fractures, ask your health care provider if you should be screened.  Ask your health care provider whether you should take a calcium or vitamin D supplement to lower your risk for osteoporosis.  Menopause may have certain physical symptoms and risks.  Hormone replacement therapy may reduce some of these symptoms and risks. Talk to your health care provider about whether hormone replacement therapy is right for you. Follow these instructions at home:  Schedule regular health, dental, and eye exams.  Stay current with your immunizations.  Do not use any tobacco products including cigarettes, chewing tobacco, or electronic cigarettes.  If you are pregnant, do not drink alcohol.  If you are breastfeeding, limit how much and how often you drink alcohol.  Limit alcohol intake to no more than 1 drink per day for nonpregnant women. One drink equals 12 ounces of beer, 5 ounces of  wine, or 1 ounces of hard liquor.  Do not use street drugs.  Do not share needles.  Ask your health care provider for help if you need support or information about quitting drugs.  Tell your health care provider if you often feel depressed.  Tell your health care provider if you have ever been abused or do not feel safe at home. This information is not intended to replace advice given to you by your health care provider. Make sure you discuss any questions you have with your health care provider. Document Released: 01/23/2011 Document Revised: 12/16/2015 Document Reviewed: 04/13/2015 Elsevier Interactive Patient Education  [employer].

## 2018-03-05 DIAGNOSIS — D225 Melanocytic nevi of trunk: Secondary | ICD-10-CM | POA: Diagnosis not present

## 2018-03-05 DIAGNOSIS — L821 Other seborrheic keratosis: Secondary | ICD-10-CM | POA: Diagnosis not present

## 2018-03-05 DIAGNOSIS — Z85828 Personal history of other malignant neoplasm of skin: Secondary | ICD-10-CM | POA: Diagnosis not present

## 2018-03-05 DIAGNOSIS — D2272 Melanocytic nevi of left lower limb, including hip: Secondary | ICD-10-CM | POA: Diagnosis not present

## 2018-06-25 ENCOUNTER — Encounter: Payer: Self-pay | Admitting: Obstetrics & Gynecology

## 2018-06-25 DIAGNOSIS — Z1231 Encounter for screening mammogram for malignant neoplasm of breast: Secondary | ICD-10-CM | POA: Diagnosis not present

## 2018-06-25 LAB — HM MAMMOGRAPHY

## 2018-06-27 ENCOUNTER — Ambulatory Visit (INDEPENDENT_AMBULATORY_CARE_PROVIDER_SITE_OTHER): Payer: BLUE CROSS/BLUE SHIELD | Admitting: Obstetrics & Gynecology

## 2018-06-27 ENCOUNTER — Encounter: Payer: Self-pay | Admitting: Obstetrics & Gynecology

## 2018-06-27 VITALS — BP 109/70 | Ht 65.0 in | Wt 109.2 lb

## 2018-06-27 DIAGNOSIS — Z780 Asymptomatic menopausal state: Secondary | ICD-10-CM

## 2018-06-27 DIAGNOSIS — Z01419 Encounter for gynecological examination (general) (routine) without abnormal findings: Secondary | ICD-10-CM | POA: Diagnosis not present

## 2018-06-27 DIAGNOSIS — G4700 Insomnia, unspecified: Secondary | ICD-10-CM | POA: Diagnosis not present

## 2018-06-27 MED ORDER — ZOLPIDEM TARTRATE 10 MG PO TABS: 10.0000 mg | ORAL_TABLET | Freq: Every evening | ORAL | 3 refills | Status: DC | PRN

## 2018-06-27 NOTE — Patient Instructions (Signed)
1. Well female exam with routine gynecological exam Normal gynecologic exam.  Pap test negative with negative high-risk HPV November 2018.  Will repeat Pap test at 2 to 3 years.  Breast exam normal.  Recent screening mammogram normal per patient, will obtain report.  Health labs with family physician.  Good body mass index at 18.17.  Continue with fitness and healthy nutrition.  2. Postmenopausal Well on no hormonal replacement therapy.  No postmenopausal bleeding.  Last bone density in 2016 was normal.  Will repeat at 5 years.  Recommend vitamin D supplements, calcium intake of 1.5 g/day and regular weightbearing physical activity.  3. Insomnia, unspecified type Counseling on insomnia done.  Recommend not to use Ambien every night as patient is currently doing.  Ambien represcribed.  Other orders - zolpidem (AMBIEN) 10 MG tablet; Take 1 tablet (10 mg total) by mouth at bedtime as needed for sleep.  Jessica Chan, it was a pleasure seeing you today!

## 2018-06-27 NOTE — Progress Notes (Signed)
Jessica Chan Mar 02, 1961 [id number]   History:    57 y.o. G2P2L2 married  RP:  Established patient presenting for annual gyn exam   HPI: Menopause, well on no hormone replacement therapy.  No postmenopausal bleeding.  No pelvic pain.  Sexually active, using [place] or Astro glide.  Will try Coconut oil.  Urine normal, no SUI.  Bowel movements normal.  Breasts normal.  Body mass index 18.17.  Regular physical activity and healthy nutrition.  Difficulty sleeping, would like a re-prescription of Ambien which she uses occasionally.  Health labs with family physician.  Past medical history,surgical history, family history and social history were all reviewed and documented in the EPIC chart.  Gynecologic History Patient's last menstrual period was 07/25/2011. Contraception: post menopausal status Last Pap: 05/2017. Results were: Negative/HPV HR neg Last mammogram: 05/2018. Results were: normal per patient, will obtain report Bone Density: 2016 normal.  Will repeat at 5 yrs Colonoscopy: 2011, 10 yr schedule  Obstetric History OB History  Gravida Para Term Preterm AB Living  2 2       2   SAB TAB Ectopic Multiple Live Births               # Outcome Date GA Lbr Len/2nd Weight Sex Delivery Anes PTL Lv  2 Para     M Vag-Spont     1 Para     M Vag-Spont        ROS: A ROS was performed and pertinent positives and negatives are included in the history.  GENERAL: No fevers or chills. HEENT: No change in vision, no earache, sore throat or sinus congestion. NECK: No pain or stiffness. CARDIOVASCULAR: No chest pain or pressure. No palpitations. PULMONARY: No shortness of breath, cough or wheeze. GASTROINTESTINAL: No abdominal pain, nausea, vomiting or diarrhea, melena or bright red blood per rectum. GENITOURINARY: No urinary frequency, urgency, hesitancy or dysuria. MUSCULOSKELETAL: No joint or muscle pain, no back pain, no recent trauma. DERMATOLOGIC: No rash, no itching, no lesions. ENDOCRINE: No  polyuria, polydipsia, no heat or cold intolerance. No recent change in weight. HEMATOLOGICAL: No anemia or easy bruising or bleeding. NEUROLOGIC: No headache, seizures, numbness, tingling or weakness. PSYCHIATRIC: No depression, no loss of interest in normal activity or change in sleep pattern.     Exam:   BP 109/70   Ht 5\' 5"  (1.651 m)   Wt 109 lb 3.2 oz (49.5 kg)   LMP 07/25/2011   BMI 18.17 kg/m   Body mass index is 18.17 kg/m.  General appearance : Well developed well nourished female. No acute distress HEENT: Eyes: no retinal hemorrhage or exudates,  Neck supple, trachea midline, no carotid bruits, no thyroidmegaly Lungs: Clear to auscultation, no rhonchi or wheezes, or rib retractions  Heart: Regular rate and rhythm, no murmurs or gallops Breast:Examined in sitting and supine position were symmetrical in appearance, no palpable masses or tenderness,  no skin retraction, no nipple inversion, no nipple discharge, no skin discoloration, no axillary or supraclavicular lymphadenopathy Abdomen: no palpable masses or tenderness, no rebound or guarding Extremities: no edema or skin discoloration or tenderness  Pelvic: Vulva: Normal             Vagina: No gross lesions or discharge  Cervix: No gross lesions or discharge  Uterus  AV, normal size, shape and consistency, non-tender and mobile  Adnexa  Without masses or tenderness  Anus: Normal   Assessment/Plan:  57 y.o. female for annual exam   1. Well  female exam with routine gynecological exam Normal gynecologic exam.  Pap test negative with negative high-risk HPV November 2018.  Will repeat Pap test at 2 to 3 years.  Breast exam normal.  Recent screening mammogram normal per patient, will obtain report.  Health labs with family physician.  Good body mass index at 18.17.  Continue with fitness and healthy nutrition.  2. Postmenopausal Well on no hormonal replacement therapy.  No postmenopausal bleeding.  Last bone density in 2016  was normal.  Will repeat at 5 years.  Recommend vitamin D supplements, calcium intake of 1.5 g/day and regular weightbearing physical activity.  3. Insomnia, unspecified type Counseling on insomnia done.  Recommend not to use Ambien every night as patient is currently doing.  Ambien represcribed.  Other orders - zolpidem (AMBIEN) 10 MG tablet; Take 1 tablet (10 mg total) by mouth at bedtime as needed for sleep.  Princess Bruins MD, 9:22 AM 06/27/2018

## 2018-07-01 ENCOUNTER — Telehealth: Payer: Self-pay | Admitting: *Deleted

## 2018-07-01 MED ORDER — ZOLPIDEM TARTRATE 10 MG PO TABS: 10.0000 mg | ORAL_TABLET | Freq: Every evening | ORAL | 3 refills | Status: DC | PRN

## 2018-07-01 NOTE — Telephone Encounter (Signed)
Patient called stating her Rx for Ambien 10 mg tablet was never called in from visit on 06/27/18. Rx called in.

## 2018-08-09 ENCOUNTER — Encounter: Payer: Self-pay | Admitting: Internal Medicine

## 2018-08-27 ENCOUNTER — Encounter: Payer: Self-pay | Admitting: Internal Medicine

## 2018-09-20 ENCOUNTER — Ambulatory Visit (AMBULATORY_SURGERY_CENTER): Payer: Self-pay

## 2018-09-20 VITALS — Ht 64.0 in | Wt 112.0 lb

## 2018-09-20 DIAGNOSIS — K5732 Diverticulitis of large intestine without perforation or abscess without bleeding: Secondary | ICD-10-CM

## 2018-09-20 MED ORDER — NA SULFATE-K SULFATE-MG SULF 17.5-3.13-1.6 GM/177ML PO SOLN: 1.0000 | Freq: Once | ORAL | 0 refills | Status: AC

## 2018-09-20 NOTE — Progress Notes (Signed)
Denies allergies to eggs or soy products. Denies complication of anesthesia or sedation. Denies use of weight loss medication. Denies use of O2.   Emmi instructions denied.    Patients prep was changed to Suprep per patients request. Patient states willing to pay the cost if insurance does not cover it. A coupon for pay no more than 50.00 was given to the patient.

## 2018-09-23 ENCOUNTER — Encounter: Payer: Self-pay | Admitting: Internal Medicine

## 2018-10-07 ENCOUNTER — Ambulatory Visit (AMBULATORY_SURGERY_CENTER): Payer: BLUE CROSS/BLUE SHIELD | Admitting: Internal Medicine

## 2018-10-07 ENCOUNTER — Other Ambulatory Visit: Payer: Self-pay

## 2018-10-07 ENCOUNTER — Encounter: Payer: Self-pay | Admitting: Internal Medicine

## 2018-10-07 VITALS — BP 104/68 | HR 75 | Temp 98.4°F | Resp 11 | Ht 65.0 in | Wt 109.0 lb

## 2018-10-07 DIAGNOSIS — Z1211 Encounter for screening for malignant neoplasm of colon: Secondary | ICD-10-CM | POA: Diagnosis not present

## 2018-10-07 DIAGNOSIS — K5732 Diverticulitis of large intestine without perforation or abscess without bleeding: Secondary | ICD-10-CM | POA: Diagnosis not present

## 2018-10-07 MED ORDER — SODIUM CHLORIDE 0.9 % IV SOLN: 500.0000 mL | Freq: Once | INTRAVENOUS | Status: DC

## 2018-10-07 NOTE — Patient Instructions (Addendum)
I did not see any polyps but unfortunately due to the problems with the prep I cannot say the exam was complete.  I recommend that you have another colonoscopy before the end of the year - I will list/bill this one as incomplete.  Will plan to remind you later in the year - don't want to reschedule soon with the Coronavirus issues.  There is a different prep that I think will me more tolerable for you.  I appreciate the opportunity to care for you. Gatha Mayer, MD, FACG  YOU HAD AN ENDOSCOPIC PROCEDURE TODAY AT [facility]:   Refer to the procedure report that was given to you for any specific questions about what was found during the examination.  If the procedure report does not answer your questions, please call your gastroenterologist to clarify.  If you requested that your care partner not be given the details of your procedure findings, then the procedure report has been included in a sealed envelope for you to review at your convenience later.  YOU SHOULD EXPECT: Some feelings of bloating in the abdomen. Passage of more gas than usual.  Walking can help get rid of the air that was put into your GI tract during the procedure and reduce the bloating. If you had a lower endoscopy (such as a colonoscopy or flexible sigmoidoscopy) you may notice spotting of blood in your stool or on the toilet paper. If you underwent a bowel prep for your procedure, you may not have a normal bowel movement for a few days.  Please Note:  You might notice some irritation and congestion in your nose or some drainage.  This is from the oxygen used during your procedure.  There is no need for concern and it should clear up in a day or so.  SYMPTOMS TO REPORT IMMEDIATELY:   Following lower endoscopy (colonoscopy or flexible sigmoidoscopy):  Excessive amounts of blood in the stool  Significant tenderness or worsening of abdominal pains  Swelling of the abdomen that is new, acute  Fever of 100F or higher    For urgent or emergent issues, a gastroenterologist can be reached at any hour by calling [phone].   DIET:  We do recommend a small meal at first, but then you may proceed to your regular diet.  Drink plenty of fluids but you should avoid alcoholic beverages for 24 hours.  ACTIVITY:  You should plan to take it easy for the rest of today and you should NOT DRIVE or use heavy machinery until tomorrow (because of the sedation medicines used during the test).    FOLLOW UP: Our staff will call the number listed on your records the next business day following your procedure to check on you and address any questions or concerns that you may have regarding the information given to you following your procedure. If we do not reach you, we will leave a message.  However, if you are feeling well and you are not experiencing any problems, there is no need to return our call.  We will assume that you have returned to your regular daily activities without incident.  If any biopsies were taken you will be contacted by phone or by letter within the next 1-3 weeks.  Please call us at [phone] if you have not heard about the biopsies in 3 weeks.    SIGNATURES/CONFIDENTIALITY: You and/or your care partner have signed paperwork which will be entered into your electronic medical record.  These signatures attest to the fact that that the information above on your After Visit Summary has been reviewed and is understood.  Full responsibility of the confidentiality of this discharge information lies with you and/or your care-partner.

## 2018-10-07 NOTE — Progress Notes (Signed)
Pt's states no medical or surgical changes since previsit or office visit. 

## 2018-10-07 NOTE — Op Note (Signed)
[place] Patient Name: Jessica Chan Procedure Date: 10/07/2018 10:57 AM MRN: [id number] Endoscopist: Gatha Mayer , MD Age: 58 Referring MD:  Date of Birth: [birth year] Gender: Female Account #: [id number] Procedure:                Colonoscopy Indications:              Screening for colorectal malignant neoplasm, Last                            colonoscopy: 2010 Medicines:                Propofol per Anesthesia, Monitored Anesthesia Care Procedure:                Pre-Anesthesia Assessment:                           - Prior to the procedure, a History and Physical                            was performed, and patient medications and                            allergies were reviewed. The patient's tolerance of                            previous anesthesia was also reviewed. The risks                            and benefits of the procedure and the sedation                            options and risks were discussed with the patient.                            All questions were answered, and informed consent                            was obtained. Prior Anticoagulants: The patient has                            taken no previous anticoagulant or antiplatelet                            agents. ASA Grade Assessment: II - A patient with                            mild systemic disease. After reviewing the risks                            and benefits, the patient was deemed in                            satisfactory condition to undergo the procedure.  After obtaining informed consent, the colonoscope                            was passed under direct vision. Throughout the                            procedure, the patient's blood pressure, pulse, and                            oxygen saturations were monitored continuously. The                            Colonoscope was introduced through the anus and                            advanced to the the  cecum, identified by                            appendiceal orifice and ileocecal valve. The                            colonoscopy was performed with difficulty due to                            inadequate bowel prep. Successful completion of the                            procedure was aided by lavage. The patient                            tolerated the procedure well. The quality of the                            bowel preparation was adequate and poor. The                            ileocecal valve, appendiceal orifice, and rectum                            were photographed. The bowel preparation used was                            SUPREP. Scope In: 11:04:58 AM Scope Out: 54:98:26 AM Scope Withdrawal Time: 0 hours 9 minutes 21 seconds  Total Procedure Duration: 0 hours 11 minutes 21 seconds  Findings:                 The perianal and digital rectal examinations were                            normal.                           Scattered diverticula were found in the left colon.  Adherent mucoid stool was found in the ascending                            colon and in the cecum, interfering with                            visualization. Lavage of the area was performed,                            resulting in incomplete clearance with fair                            visualization.                           The exam was otherwise without abnormality on                            direct and retroflexion views. Complications:            No immediate complications. Estimated Blood Loss:     Estimated blood loss: none. Impression:               - Preparation of the colon was poor.                           - Diverticulosis in the left colon.                           - Stool in the ascending colon and in the cecum.                           - The examination was otherwise normal on direct                            and retroflexion views.                            - No specimens collected. Recommendation:           - Patient has a contact number available for                            emergencies. The signs and symptoms of potential                            delayed complications were discussed with the                            patient. Return to normal activities tomorrow.                            Written discharge instructions were provided to the                            patient.                           -  Resume previous diet.                           - Continue present medications.                           - Repeat colonoscopy This year - will RECALL June                            2020 because the bowel preparation was poor. Would                            try MiraLAx prep - think she would tolerate that                            better Gatha Mayer, MD 10/07/2018 11:25:09 AM This report has been signed electronically.

## 2018-10-07 NOTE — Progress Notes (Signed)
PT taken to PACU. Monitors in place. VSS. Report given to RN. 

## 2018-10-08 ENCOUNTER — Telehealth: Payer: Self-pay | Admitting: Internal Medicine

## 2018-10-08 ENCOUNTER — Telehealth: Payer: Self-pay

## 2018-10-08 NOTE — Telephone Encounter (Signed)
Patient's questions about a high fiber diet.  She will call back for any additional questions or concerns.

## 2018-10-08 NOTE — Telephone Encounter (Signed)
Hi Jessica Chan, patient already spoke with nurse at [facility], she has additional questions and was told to call the office, I forgot for not mentioning in the message. Thank you.

## 2018-10-08 NOTE — Telephone Encounter (Signed)
Attempted to return call.  No machine

## 2018-10-08 NOTE — Telephone Encounter (Signed)
  Follow up Call-  Call back number 10/07/2018  Post procedure Call Back phone  # [phone]  Permission to leave phone message Yes  Some recent data might be hidden     Patient questions:  Do you have a fever, pain , or abdominal swelling? No. Pain Score  0 *  Have you tolerated food without any problems? Yes.    Have you been able to return to your normal activities? Yes.    Do you have any questions about your discharge instructions: Diet   No. Medications  No. Follow up visit  Yes.    Do you have questions or concerns about your Care? No.  Actions: * If pain score is 4 or above: No action needed, pain <4.

## 2018-10-08 NOTE — Telephone Encounter (Signed)
Pt has several questions regarding her colonoscopy yesterday that had to be aborted, pt is very concerned regarding her diverticulitis, pls call her.

## 2018-11-08 NOTE — Progress Notes (Signed)
Virtual Visit via Video Note  I connected with@ on 11/11/18 at 10:00 AM EDT by a video enabled telemedicine application and verified that I am speaking with the correct person using two identifiers. Location patient: home Location provider:work  office Persons participating in the virtual visit: patient, provider and son  Who has ASD  [place] national recommendations  regarding COVID 19 pandemic   video visit is advised over in office visit for this patient.  Discussed the limitations of evaluation and management by telemedicine and  availability of in person appointments. The patient expressed understanding and agreed to proceed.   HPI: [place] today for vv I nlie of her yearly visit   Was supposed to be  Pv but med and problem based visit today  Sleep  Ok  Falling asleep ok but taking 1/2 - 1/4 of ambien about 4 days per week to stay asleep till 5  Gyne dr Dellis Filbert gave last rx and doing ok .    Lipids  Not exercising as much but  Weight stable .    GI: had  Un adequate    Test colon but she is concerned that there were divertic seen    Health Maintenance  Topic Date Due  . Hepatitis C Screening  15-Mar-1961  . HIV Screening  09/05/1975  . INFLUENZA VACCINE  02/22/2019  . PAP SMEAR-Modifier  06/21/2020  . MAMMOGRAM  06/25/2020  . TETANUS/TDAP  10/07/2027  . COLONOSCOPY  10/06/2028     Neg TD  [place]  Of wine at night not near bed.time hh of 3  Pet cat  ROS: See pertinent positives and negatives per HPI. No cp  pulm sx   Past Medical History:  Diagnosis Date  . Allergic rhinitis   . Allergy   . Anxiety   . Anxiety disorder   . BLEPHARITIS 05/27/2007   Qualifier: Diagnosis of  By: Hulan Saas, CMA (AAMA), [place] Skye   . Depression    Pt. denies  . Diverticulitis   . Diverticulitis of colon - recurrent 10/02/2013  . Diverticulosis   . ESOPHAGEAL STRICTURE 11/15/2009   Qualifier: Diagnosis of  By: Trellis Paganini PA-c, Amy S   . GERD (gastroesophageal reflux disease)    . Hemangioma of liver    incidental finding-large, right and left lobes of liver   . Hiatal hernia   . IBS (irritable bowel syndrome)   . PONV (postoperative nausea and vomiting)    SEVERE NAUSEA & VOMITING  . Sinusitis, acute maxillary 07/29/2012   right  hx of sinus surgery  fu if needed recurrecne      Past Surgical History:  Procedure Laterality Date  . adnoidectomy    . COLONOSCOPY    . DILATATION & CURETTAGE/HYSTEROSCOPY WITH MYOSURE N/A 05/23/2016   Procedure: DILATATION & CURETTAGE/HYSTEROSCOPY WITH MYOSURE;  Surgeon: Terrance Mass, MD;  Location: [facility];  Service: Gynecology;  Laterality: N/A;  Request to post this for 12:30pm. That will leave room for a second major case for [place]. If we do not get a 2nd case in the next week I will move it up to follow. Thanks!  Needs one hour.  REQUEST FOR TINA RN AND AUDREY CST  . endoscopy with dialation    . NASAL SINUS SURGERY    . PROCTOSCOPY N/A 07/02/2014   Procedure: PROCTOSCOPY;  Surgeon: Michael Boston, MD;  Location: [facility];  Service: General;  Laterality: N/A;  . ROBOT ASSISTED LAPAROSCOPIC PARTIAL COLECTOMY    . TONSILLECTOMY    .  ulnar nerve right arm    . UPPER GASTROINTESTINAL ENDOSCOPY      Family History  Problem Relation Age of Onset  . Lung cancer Mother   . Autism Son   . Colon cancer Maternal Uncle   . Heart disease Father   . Breast cancer Maternal Grandmother   . Esophageal cancer Neg Hx   . Rectal cancer Neg Hx   . Stomach cancer Neg Hx     Social History   Tobacco Use  . Smoking status: Never Smoker  . Smokeless tobacco: Never Used  Substance Use Topics  . Alcohol use: Yes    Alcohol/week: 0.0 standard drinks    Comment: Social Drinker--SEVERAL DRINKS A WEEK  . Drug use: No      Current Outpatient Medications:  .  ALPRAZolam (XANAX) 0.25 MG tablet, Take 1 tablet (0.25 mg total) by mouth daily as needed for anxiety. Avoid regular use (Patient not taking: Reported on 10/07/2018), Disp: 20  tablet, Rfl: 0 .  BIOTIN PO, Take 1 capsule by mouth daily., Disp: , Rfl:  .  Multiple Vitamin (MULTIVITAMIN) tablet, Take 1 tablet by mouth every morning. , Disp: , Rfl:  .  Omega-3 Fatty Acids (FISH OIL PO), Take 1 capsule by mouth daily., Disp: , Rfl:  .  OVER THE COUNTER MEDICATION, somnapure natural sleep aid..  Melatonin, l-theanine, valerian root, hops extract, lemon balm, chamomile flower and passion flower, Disp: , Rfl:  .  tretinoin (RETIN-A) 0.025 % cream, apply to affected area at bedtime TO DRY SKIN FOR ACNE, Disp: , Rfl: 0 .  zolpidem (AMBIEN) 10 MG tablet, Take 1 tablet (10 mg total) by mouth at bedtime as needed for sleep., Disp: 30 tablet, Rfl: 3 .  zolpidem (AMBIEN) 10 MG tablet, Take 10 mg by mouth as needed., Disp: , Rfl:   EXAM: BP Readings from Last 3 Encounters:  10/07/18 104/68  06/27/18 109/70  11/07/17 98/68    VITALS per patient if applicable:  GENERAL: alert, oriented, appears well and in no acute distress  HEENT: atraumatic, conjunttiva clear, no obvious abnormalities on inspection of external nose and ears  NECK: normal movements of the head and neck  LUNGS: on inspection no signs of respiratory distress, breathing rate appears normal, no obvious gross SOB, gasping or wheezing  CV: no obvious cyanosis  MS: moves all visible extremities without noticeable abnormality  PSYCH/NEURO: pleasant and cooperative, no obvious depression or anxiety, speech and thought processing grossly intact Lab Results  Component Value Date   WBC 4.4 11/07/2017   HGB 13.1 11/07/2017   HCT 38.5 11/07/2017   PLT 215.0 11/07/2017   GLUCOSE 82 11/07/2017   CHOL 198 11/07/2017   TRIG 57.0 11/07/2017   HDL 73.20 11/07/2017   LDLCALC 113 (H) 11/07/2017   ALT 9 11/07/2017   AST 17 11/07/2017   NA 142 11/07/2017   K 4.4 11/07/2017   CL 103 11/07/2017   CREATININE 0.70 11/07/2017   BUN 17 11/07/2017   CO2 30 11/07/2017   TSH 3.93 11/07/2017   HGBA1C 5.0 11/07/2017     ASSESSMENT AND PLAN:  Discussed the following assessment and plan:  Insomnia, unspecified type - Plan: Basic metabolic panel, CBC with Differential/Platelet, Hemoglobin A1c, Hepatic function panel, TSH, T4, free, Lipid panel  Medication management - Plan: Basic metabolic panel, CBC with Differential/Platelet, Hemoglobin A1c, Hepatic function panel, TSH, T4, free, Lipid panel  Hyperlipidemia, unspecified hyperlipidemia type - Plan: Basic metabolic panel, CBC with Differential/Platelet, Hemoglobin A1c, Hepatic  function panel, TSH, T4, free, Lipid panel  Elevated blood sugar - Plan: Basic metabolic panel, CBC with Differential/Platelet, Hemoglobin A1c, Hepatic function panel, TSH, T4, free, Lipid panel  Family history of thyroid disease - Plan: Basic metabolic panel, CBC with Differential/Platelet, Hemoglobin A1c, Hepatic function panel, TSH, T4, free, Lipid panel  History of partial colectomy - for  diverticulitis recurrent  Need for hepatitis C screening test - fo r future labs - Plan: Hepatitis C antibody his  Will plan monitor  Follow [place] ng thyroid  fam hx  Following  Diverticulosis sp colon resection  No sx now    Low risk hep c  But not tested in past. No donation of blood Counseled.   And can  Ask  ? Dr g when  Possible but not concerning   Expectant management and discussion of plan and treatment with patient with opportunity to ask questions and all were answered. The patient agreed with the plan and demonstrated an understanding of the instructions.  plan lab in summer or when safe and physical exam late summer early fall  The patient was advised to call back or evaluation if  having concerns .   Shanon Ace, MD

## 2018-11-11 ENCOUNTER — Ambulatory Visit (INDEPENDENT_AMBULATORY_CARE_PROVIDER_SITE_OTHER): Payer: BLUE CROSS/BLUE SHIELD | Admitting: Internal Medicine

## 2018-11-11 ENCOUNTER — Encounter: Payer: Self-pay | Admitting: Internal Medicine

## 2018-11-11 ENCOUNTER — Other Ambulatory Visit: Payer: Self-pay

## 2018-11-11 DIAGNOSIS — E785 Hyperlipidemia, unspecified: Secondary | ICD-10-CM

## 2018-11-11 DIAGNOSIS — G4700 Insomnia, unspecified: Secondary | ICD-10-CM

## 2018-11-11 DIAGNOSIS — R739 Hyperglycemia, unspecified: Secondary | ICD-10-CM

## 2018-11-11 DIAGNOSIS — Z79899 Other long term (current) drug therapy: Secondary | ICD-10-CM | POA: Diagnosis not present

## 2018-11-11 DIAGNOSIS — Z1159 Encounter for screening for other viral diseases: Secondary | ICD-10-CM

## 2018-11-11 DIAGNOSIS — Z8349 Family history of other endocrine, nutritional and metabolic diseases: Secondary | ICD-10-CM

## 2018-11-11 DIAGNOSIS — Z9049 Acquired absence of other specified parts of digestive tract: Secondary | ICD-10-CM

## 2018-12-13 ENCOUNTER — Encounter: Payer: Self-pay | Admitting: Internal Medicine

## 2018-12-25 ENCOUNTER — Other Ambulatory Visit: Payer: Self-pay

## 2018-12-25 ENCOUNTER — Other Ambulatory Visit (INDEPENDENT_AMBULATORY_CARE_PROVIDER_SITE_OTHER): Payer: BC Managed Care – PPO

## 2018-12-25 DIAGNOSIS — E785 Hyperlipidemia, unspecified: Secondary | ICD-10-CM | POA: Diagnosis not present

## 2018-12-25 DIAGNOSIS — R739 Hyperglycemia, unspecified: Secondary | ICD-10-CM | POA: Diagnosis not present

## 2018-12-25 DIAGNOSIS — Z1159 Encounter for screening for other viral diseases: Secondary | ICD-10-CM

## 2018-12-25 DIAGNOSIS — Z79899 Other long term (current) drug therapy: Secondary | ICD-10-CM

## 2018-12-25 DIAGNOSIS — G4700 Insomnia, unspecified: Secondary | ICD-10-CM | POA: Diagnosis not present

## 2018-12-25 DIAGNOSIS — Z8349 Family history of other endocrine, nutritional and metabolic diseases: Secondary | ICD-10-CM

## 2018-12-25 LAB — BASIC METABOLIC PANEL
BUN: 14 mg/dL (ref 6–23)
CO2: 29 mEq/L (ref 19–32)
Calcium: 9.3 mg/dL (ref 8.4–10.5)
Chloride: 104 mEq/L (ref 96–112)
Creatinine, Ser: 0.84 mg/dL (ref 0.40–1.20)
GFR: 69.56 mL/min (ref >60.00)
Glucose, Bld: 91 mg/dL (ref 70–99)
Potassium: 4.6 mEq/L (ref 3.5–5.1)
Sodium: 142 mEq/L (ref 135–145)

## 2018-12-25 LAB — CBC WITH DIFFERENTIAL/PLATELET
Basophils Absolute: 0 10*3/uL (ref 0.0–0.1)
Basophils Relative: 0.4 % (ref 0.0–3.0)
Eosinophils Absolute: 0.1 10*3/uL (ref 0.0–0.7)
Eosinophils Relative: 1.4 % (ref 0.0–5.0)
HCT: 38.6 % (ref 36.0–46.0)
Hemoglobin: 13.2 g/dL (ref 12.0–15.0)
Lymphocytes Relative: 31.8 % (ref 12.0–46.0)
Lymphs Abs: 1.2 10*3/uL (ref 0.7–4.0)
MCHC: 34.1 g/dL (ref 30.0–36.0)
MCV: 93.8 fl (ref 78.0–100.0)
Monocytes Absolute: 0.3 10*3/uL (ref 0.1–1.0)
Monocytes Relative: 8.3 % (ref 3.0–12.0)
Neutro Abs: 2.2 10*3/uL (ref 1.4–7.7)
Neutrophils Relative %: 58.1 % (ref 43.0–77.0)
Platelets: 197 10*3/uL (ref 150.0–400.0)
RBC: 4.12 Mil/uL (ref 3.87–5.11)
RDW: 12.8 % (ref 11.5–15.5)
WBC: 3.8 10*3/uL — ABNORMAL LOW (ref 4.0–10.5)

## 2018-12-25 LAB — HEPATIC FUNCTION PANEL
ALT: 8 U/L (ref 0–35)
AST: 16 U/L (ref 0–37)
Albumin: 4.8 g/dL (ref 3.5–5.2)
Alkaline Phosphatase: 56 U/L (ref 39–117)
Bilirubin, Direct: 0.1 mg/dL (ref 0.0–0.3)
Total Bilirubin: 0.7 mg/dL (ref 0.2–1.2)
Total Protein: 6.9 g/dL (ref 6.0–8.3)

## 2018-12-25 LAB — LIPID PANEL
Cholesterol: 195 mg/dL (ref 0–200)
HDL: 67.2 mg/dL (ref >39.00)
LDL Cholesterol: 113 mg/dL — ABNORMAL HIGH (ref 0–99)
NonHDL: 128.26
Total CHOL/HDL Ratio: 3
Triglycerides: 75 mg/dL (ref 0.0–149.0)
VLDL: 15 mg/dL (ref 0.0–40.0)

## 2018-12-25 LAB — T4, FREE: Free T4: 0.91 ng/dL (ref 0.60–1.60)

## 2018-12-25 LAB — HEMOGLOBIN A1C: Hgb A1c MFr Bld: 5.1 % (ref 4.6–6.5)

## 2018-12-25 LAB — TSH: TSH: 4.22 u[IU]/mL (ref 0.35–4.50)

## 2018-12-26 LAB — HEPATITIS C ANTIBODY
Hepatitis C Ab: NONREACTIVE
SIGNAL TO CUT-OFF: 0.01 (ref <1.00)

## 2018-12-31 ENCOUNTER — Other Ambulatory Visit: Payer: Self-pay | Admitting: Obstetrics & Gynecology

## 2019-01-02 NOTE — Telephone Encounter (Signed)
Called into pharmacy

## 2019-03-18 ENCOUNTER — Telehealth (INDEPENDENT_AMBULATORY_CARE_PROVIDER_SITE_OTHER): Payer: BC Managed Care – PPO | Admitting: Internal Medicine

## 2019-03-18 ENCOUNTER — Other Ambulatory Visit: Payer: Self-pay

## 2019-03-18 ENCOUNTER — Encounter: Payer: Self-pay | Admitting: Internal Medicine

## 2019-03-18 DIAGNOSIS — Z79899 Other long term (current) drug therapy: Secondary | ICD-10-CM

## 2019-03-18 DIAGNOSIS — G4700 Insomnia, unspecified: Secondary | ICD-10-CM | POA: Diagnosis not present

## 2019-03-18 MED ORDER — GABAPENTIN 100 MG PO CAPS: 100.0000 mg | ORAL_CAPSULE | Freq: Every day | ORAL | 0 refills | Status: DC

## 2019-03-18 NOTE — Progress Notes (Signed)
Virtual Visit via Video Note  I connected with@ on 03/18/19 at  3:00 PM EDT by a video enabled telemedicine application and verified that I am speaking with the correct person using two identifiers. Location patient: home Location provider:work  office Persons participating in the virtual visit: patient, provider  [place] national recommendations  regarding COVID 19 pandemic   video visit is advised over in office visit for this patient.  Patient aware  of the limitations of evaluation and management by telemedicine and  availability of in person appointments. and agreed to proceed.   HPI: Jessica Chan presents for video visit  Hard time maintaining  sleep and   Takes 1/2  1/4 ambien   2 am to get to stay asleep  Till 6   Has dona lot of sleep hygeine  things but getw anxious about  Not sleeping there was a time of 2 weeks she didn't need to take med   No osa but may awaken to use the bathroom  Other things to try?    Mild anxiety no etoh past 6 pm  No late caffeine .  Well otherwise at this time   No other new external factors  Gets   Some mild hot flushes    No RLS . Sx .   Trying to  Not have to take a regular medication  ROS: See pertinent positives and negatives per HPI.  Past Medical History:  Diagnosis Date  . Allergic rhinitis   . Allergy   . Anxiety   . Anxiety disorder   . BLEPHARITIS 05/27/2007   Qualifier: Diagnosis of  By: Hulan Saas, CMA (AAMA), [place] Skye   . Depression    Pt. denies  . Diverticulitis   . Diverticulitis of colon - recurrent 10/02/2013  . Diverticulosis   . ESOPHAGEAL STRICTURE 11/15/2009   Qualifier: Diagnosis of  By: Trellis Paganini PA-c, Amy S   . GERD (gastroesophageal reflux disease)   . Hemangioma of liver    incidental finding-large, right and left lobes of liver   . Hiatal hernia   . IBS (irritable bowel syndrome)   . PONV (postoperative nausea and vomiting)    SEVERE NAUSEA & VOMITING  . Sinusitis, acute maxillary 07/29/2012   right  hx of sinus  surgery  fu if needed recurrecne      Past Surgical History:  Procedure Laterality Date  . adnoidectomy    . COLONOSCOPY    . DILATATION & CURETTAGE/HYSTEROSCOPY WITH MYOSURE N/A 05/23/2016   Procedure: DILATATION & CURETTAGE/HYSTEROSCOPY WITH MYOSURE;  Surgeon: Terrance Mass, MD;  Location: [facility];  Service: Gynecology;  Laterality: N/A;  Request to post this for 12:30pm. That will leave room for a second major case for [place]. If we do not get a 2nd case in the next week I will move it up to follow. Thanks!  Needs one hour.  REQUEST FOR TINA RN AND AUDREY CST  . endoscopy with dialation    . NASAL SINUS SURGERY    . PROCTOSCOPY N/A 07/02/2014   Procedure: PROCTOSCOPY;  Surgeon: Michael Boston, MD;  Location: [facility];  Service: General;  Laterality: N/A;  . ROBOT ASSISTED LAPAROSCOPIC PARTIAL COLECTOMY    . TONSILLECTOMY    . ulnar nerve right arm    . UPPER GASTROINTESTINAL ENDOSCOPY      Family History  Problem Relation Age of Onset  . Lung cancer Mother   . Autism Son   . Colon cancer Maternal Uncle   . Heart  disease Father   . Breast cancer Maternal Grandmother   . Esophageal cancer Neg Hx   . Rectal cancer Neg Hx   . Stomach cancer Neg Hx     Social History   Tobacco Use  . Smoking status: Never Smoker  . Smokeless tobacco: Never Used  Substance Use Topics  . Alcohol use: Yes    Alcohol/week: 0.0 standard drinks    Comment: Social Drinker--SEVERAL DRINKS A WEEK  . Drug use: No      Current Outpatient Medications:  .  ALPRAZolam (XANAX) 0.25 MG tablet, Take 1 tablet (0.25 mg total) by mouth daily as needed for anxiety. Avoid regular use (Patient not taking: Reported on 10/07/2018), Disp: 20 tablet, Rfl: 0 .  BIOTIN PO, Take 1 capsule by mouth daily., Disp: , Rfl:  .  gabapentin (NEURONTIN) 100 MG capsule, Take 1 capsule (100 mg total) by mouth at bedtime. Increase to 300 mg po hs as tolerated, Disp: 60 capsule, Rfl: 0 .  Multiple Vitamin (MULTIVITAMIN) tablet,  Take 1 tablet by mouth every morning. , Disp: , Rfl:  .  Omega-3 Fatty Acids (FISH OIL PO), Take 1 capsule by mouth daily., Disp: , Rfl:  .  OVER THE COUNTER MEDICATION, somnapure natural sleep aid..  Melatonin, l-theanine, valerian root, hops extract, lemon balm, chamomile flower and passion flower, Disp: , Rfl:  .  tretinoin (RETIN-A) 0.025 % cream, apply to affected area at bedtime TO DRY SKIN FOR ACNE, Disp: , Rfl: 0 .  zolpidem (AMBIEN) 10 MG tablet, Take 1 tablet (10 mg total) by mouth at bedtime as needed for sleep., Disp: 30 tablet, Rfl: 3 .  zolpidem (AMBIEN) 10 MG tablet, TAKE 1 TABLET BY MOUTH EVERY DAY AT BEDTIME AS NEEDED, Disp: 30 tablet, Rfl: 1  EXAM: BP Readings from Last 3 Encounters:  10/07/18 104/68  06/27/18 109/70  11/07/17 98/68    VITALS per patient if applicable:  GENERAL: alert, oriented, appears well and in no acute distress  HEENT: atraumatic, conjunttiva clear, no obvious abnormalities on inspection of external nose and ears  NECK: normal movements of the head and neck  LUNGS: on inspection no signs of respiratory distress, breathing rate appears normal, no obvious gross SOB, gasping or wheezing  CV: no obvious cyanosis  MS: moves all visible extremities without noticeable abnormality  PSYCH/NEURO: pleasant and cooperative, no obvious depression or anxiety, speech and thought processing grossly intact Lab Results  Component Value Date   WBC 3.8 (L) 12/25/2018   HGB 13.2 12/25/2018   HCT 38.6 12/25/2018   PLT 197.0 12/25/2018   GLUCOSE 91 12/25/2018   CHOL 195 12/25/2018   TRIG 75.0 12/25/2018   HDL 67.20 12/25/2018   LDLCALC 113 (H) 12/25/2018   ALT 8 12/25/2018   AST 16 12/25/2018   NA 142 12/25/2018   K 4.6 12/25/2018   CL 104 12/25/2018   CREATININE 0.84 12/25/2018   BUN 14 12/25/2018   CO2 29 12/25/2018   TSH 4.22 12/25/2018   HGBA1C 5.1 12/25/2018    ASSESSMENT AND PLAN:  Discussed the following assessment and plan:    ICD-10-CM    1. Insomnia, unspecified type  G47.00   2. Medication management  Z79.899    Hard time maintaining  sleep and   Takes 1/2  1/4 ambien  1-3 am to get to stay asleep  Till 6   Has done a lot of sleep hygeine  things but get w/ anxious about  Not sleeping there was a  time of 2 weeks she didn't need to take med   No osa but may awaken to use the bathroom  Counseled. Trial low dose and in gabapentin since perhaps some hormonal sx  Consider belsomra silenor hydroxizine or  citalopram  Plan  Update in about 2- 3 weeks about how doing   Expectant management and discussion of plan and treatment with opportunity to ask questions and all were answered. The patient agreed with the plan and demonstrated an understanding of the instructions.   Advised to call back or seek an in-person evaluation if worsening  or having  further concerns .  I provided  25  minutes of non-face-to-face time during this encounter.      Shanon Ace, MD

## 2019-03-20 DIAGNOSIS — D2271 Melanocytic nevi of right lower limb, including hip: Secondary | ICD-10-CM | POA: Diagnosis not present

## 2019-03-20 DIAGNOSIS — D225 Melanocytic nevi of trunk: Secondary | ICD-10-CM | POA: Diagnosis not present

## 2019-03-20 DIAGNOSIS — D2230 Melanocytic nevi of unspecified part of face: Secondary | ICD-10-CM | POA: Diagnosis not present

## 2019-03-20 DIAGNOSIS — D2272 Melanocytic nevi of left lower limb, including hip: Secondary | ICD-10-CM | POA: Diagnosis not present

## 2019-04-18 ENCOUNTER — Other Ambulatory Visit: Payer: Self-pay

## 2019-04-18 MED ORDER — GABAPENTIN 100 MG PO CAPS: 100.0000 mg | ORAL_CAPSULE | Freq: Every day | ORAL | 0 refills | Status: DC

## 2019-05-07 ENCOUNTER — Encounter: Payer: Self-pay | Admitting: Physician Assistant

## 2019-05-07 ENCOUNTER — Ambulatory Visit: Payer: BC Managed Care – PPO | Admitting: Physician Assistant

## 2019-05-07 VITALS — BP 114/84 | HR 80 | Temp 97.9°F | Ht 65.55 in | Wt 109.1 lb

## 2019-05-07 DIAGNOSIS — Z1211 Encounter for screening for malignant neoplasm of colon: Secondary | ICD-10-CM

## 2019-05-07 MED ORDER — METOCLOPRAMIDE HCL 10 MG PO TABS: 10.0000 mg | ORAL_TABLET | ORAL | 0 refills | Status: DC

## 2019-05-07 NOTE — Progress Notes (Signed)
Chief Complaint: Discussion of repeat screening colonoscopy  HPI:    Jessica Chan is a 58 year old female with a past medical history as listed below, known to Dr. Carlean Purl, who was referred to me by Panosh, Standley Brooking, MD for colonoscopy.      10/07/2018 colonoscopy with Dr. Carlean Purl with findings of poor preparation of the colon, diverticulosis in the left colon, stool in the ascending colon and cecum.  Patient was told to have a recall colonoscopy in June because the bowel preparation was poor.  It was recommended she try MiraLAX prep but she may tolerate this better.    Today, the patient presents clinic and explains that she could just not get through the second bottle of Suprep due to nausea and vomiting.  Her husband was also not with her and she tells me that when she starts vomiting sometimes she passes out so she was worried about this so did not complete the prep.  Tells me she has used MiraLAX prep in the past every time and has never had trouble with "getting clean".  Denies any changes in her GI system since time of colonoscopy.  Does tell me she has occasional left lower quadrant discomfort and is somewhat disheartened by this given that she had "part of my colon removed so this would not happen anymore".    Denies fever, chills, weight loss, anorexia, nausea, vomiting, heartburn, reflux or change in bowel habits.  Past Medical History:  Diagnosis Date  . Allergic rhinitis   . Allergy   . Anxiety   . Anxiety disorder   . BLEPHARITIS 05/27/2007   Qualifier: Diagnosis of  By: Hulan Saas, CMA (AAMA), [place]   . Depression    Pt. denies  . Diverticulitis   . Diverticulitis of colon - recurrent 10/02/2013  . Diverticulosis   . ESOPHAGEAL STRICTURE 11/15/2009   Qualifier: Diagnosis of  By: Trellis Paganini PA-c, Amy S   . GERD (gastroesophageal reflux disease)   . Hemangioma of liver    incidental finding-large, right and left lobes of liver   . Hiatal hernia   . IBS (irritable bowel syndrome)    . PONV (postoperative nausea and vomiting)    SEVERE NAUSEA & VOMITING  . Sinusitis, acute maxillary 07/29/2012   right  hx of sinus surgery  fu if needed recurrecne      Past Surgical History:  Procedure Laterality Date  . adnoidectomy    . COLONOSCOPY    . DILATATION & CURETTAGE/HYSTEROSCOPY WITH MYOSURE N/A 05/23/2016   Procedure: DILATATION & CURETTAGE/HYSTEROSCOPY WITH MYOSURE;  Surgeon: Terrance Mass, MD;  Location: [facility];  Service: Gynecology;  Laterality: N/A;  Request to post this for 12:30pm. That will leave room for a second major case for [place]. If we do not get a 2nd case in the next week I will move it up to follow. Thanks!  Needs one hour.  REQUEST FOR TINA RN AND AUDREY CST  . endoscopy with dialation    . NASAL SINUS SURGERY    . PROCTOSCOPY N/A 07/02/2014   Procedure: PROCTOSCOPY;  Surgeon: Michael Boston, MD;  Location: [facility];  Service: General;  Laterality: N/A;  . ROBOT ASSISTED LAPAROSCOPIC PARTIAL COLECTOMY    . TONSILLECTOMY    . ulnar nerve right arm    . UPPER GASTROINTESTINAL ENDOSCOPY      Current Outpatient Medications  Medication Sig Dispense Refill  . ALPRAZolam (XANAX) 0.25 MG tablet Take 1 tablet (0.25 mg total) by mouth daily as  needed for anxiety. Avoid regular use (Patient not taking: Reported on 10/07/2018) 20 tablet 0  . BIOTIN PO Take 1 capsule by mouth daily.    Marland Kitchen gabapentin (NEURONTIN) 100 MG capsule Take 1 capsule (100 mg total) by mouth at bedtime. Increase to 300 mg po hs as tolerated 60 capsule 0  . Multiple Vitamin (MULTIVITAMIN) tablet Take 1 tablet by mouth every morning.     . Omega-3 Fatty Acids (FISH OIL PO) Take 1 capsule by mouth daily.    Marland Kitchen OVER THE COUNTER MEDICATION somnapure natural sleep aid..  Melatonin, l-theanine, valerian root, hops extract, lemon balm, chamomile flower and passion flower    . tretinoin (RETIN-A) 0.025 % cream apply to affected area at bedtime TO DRY SKIN FOR ACNE  0  . zolpidem (AMBIEN) 10 MG tablet  Take 1 tablet (10 mg total) by mouth at bedtime as needed for sleep. 30 tablet 3  . zolpidem (AMBIEN) 10 MG tablet TAKE 1 TABLET BY MOUTH EVERY DAY AT BEDTIME AS NEEDED 30 tablet 1   No current facility-administered medications for this visit.     Allergies as of 05/07/2019 - Review Complete 03/18/2019  Allergen Reaction Noted  . Shellfish allergy Hives, Swelling, and Other (See Comments) 05/03/2016  . Demerol [meperidine] Hives and Rash 05/03/2016  . Amoxicillin-pot clavulanate Nausea Only   . Meperidine hcl  05/27/2007  . Oxycodone Nausea Only 07/06/2014    Family History  Problem Relation Age of Onset  . Lung cancer Mother   . Autism Son   . Colon cancer Maternal Uncle   . Heart disease Father   . Breast cancer Maternal Grandmother   . Esophageal cancer Neg Hx   . Rectal cancer Neg Hx   . Stomach cancer Neg Hx     Social History   Socioeconomic History  . Marital status: Married    Spouse name: Not on file  . Number of children: 2  . Years of education: Not on file  . Highest education level: Not on file  Occupational History  . Occupation: Physiological scientist: UNEMPLOYED  Social Needs  . Financial resource strain: Not on file  . Food insecurity    Worry: Not on file    Inability: Not on file  . Transportation needs    Medical: Not on file    Non-medical: Not on file  Tobacco Use  . Smoking status: Never Smoker  . Smokeless tobacco: Never Used  Substance and Sexual Activity  . Alcohol use: Yes    Alcohol/week: 0.0 standard drinks    Comment: Social Drinker--SEVERAL DRINKS A WEEK  . Drug use: No  . Sexual activity: Yes    Partners: Male    Comment: 1st intercourse- 88, partners- 5 married- 97 yrs  Lifestyle  . Physical activity    Days per week: Not on file    Minutes per session: Not on file  . Stress: Not on file  Relationships  . Social Herbalist on phone: Not on file    Gets together: Not on file    Attends religious service: Not on  file    Active member of club or organization: Not on file    Attends meetings of clubs or organizations: Not on file    Relationship status: Not on file  . Intimate partner violence    Fear of current or ex partner: Not on file    Emotionally abused: Not on file    Physically  abused: Not on file    Forced sexual activity: Not on file  Other Topics Concern  . Not on file  Social History Narrative   Married HHof 4 one sone autistic   Husband had prostate cancer in last few years   Some exercise  Runs    No tobacco    Review of Systems:    Constitutional: No weight loss, fever or chills Cardiovascular: No chest pain Respiratory: No SOB  Gastrointestinal: See HPI and otherwise negative   Physical Exam:  Vital signs: Temp 97.9 F (36.6 C)   Ht 5' 5.55" (1.665 m)   Wt 109 lb 2 oz (49.5 kg)   LMP 07/25/2011   BMI 17.86 kg/m   Constitutional:   Pleasant Caucasian female appears to be in NAD, Well developed, Well nourished, alert and cooperative Respiratory: Respirations even and unlabored. Lungs clear to auscultation bilaterally.   No wheezes, crackles, or rhonchi.  Cardiovascular: Normal S1, S2. No MRG. Regular rate and rhythm. No peripheral edema, cyanosis or pallor.  Gastrointestinal:  Soft, nondistended, nontender. No rebound or guarding. Normal bowel sounds. No appreciable masses or hepatomegaly. Psychiatric: Demonstrates good judgement and reason without abnormal affect or behaviors.  MOST RECENT LABS AND IMAGING: CBC    Component Value Date/Time   WBC 3.8 (L) 12/25/2018 0903   RBC 4.12 12/25/2018 0903   HGB 13.2 12/25/2018 0903   HCT 38.6 12/25/2018 0903   PLT 197.0 12/25/2018 0903   MCV 93.8 12/25/2018 0903   MCH 30.9 10/06/2017 0607   MCHC 34.1 12/25/2018 0903   RDW 12.8 12/25/2018 0903   LYMPHSABS 1.2 12/25/2018 0903   MONOABS 0.3 12/25/2018 0903   EOSABS 0.1 12/25/2018 0903   BASOSABS 0.0 12/25/2018 0903    CMP     Component Value Date/Time   NA 142  12/25/2018 0903   K 4.6 12/25/2018 0903   CL 104 12/25/2018 0903   CO2 29 12/25/2018 0903   GLUCOSE 91 12/25/2018 0903   BUN 14 12/25/2018 0903   CREATININE 0.84 12/25/2018 0903   CALCIUM 9.3 12/25/2018 0903   PROT 6.9 12/25/2018 0903   ALBUMIN 4.8 12/25/2018 0903   AST 16 12/25/2018 0903   ALT 8 12/25/2018 0903   ALKPHOS 56 12/25/2018 0903   BILITOT 0.7 12/25/2018 0903   GFRNONAA >60 10/06/2017 0607   GFRAA >60 10/06/2017 [id number]    Assessment: 1.  Screening for colorectal cancer: Last colonoscopy 10/07/2018 with inadequate prep, repeat recommended in June, patient is now overdue  Plan: 1.  Per Dr. Carlean Purl recommend the patient try MiraLAX prep this time.  We will try this this time.. 2.  Scheduled patient for colonoscopy in the [facility] with Dr. Carlean Purl.  Did discuss risks, benefits, limitations and alternatives and the patient agrees to proceed. 3.  Prescribed Reglan 10 mg #2, 120 to 30 minutes before the first half of prep and the other 1 2030 minutes before the second half. 4.  Patient to follow in clinic per recommendations from Dr. Carlean Purl after time of procedure.  Ellouise Newer, PA-C [facility] 05/07/2019, 9:30 AM  Cc: Burnis Medin, MD

## 2019-05-07 NOTE — Patient Instructions (Signed)
If you are age 58 or older, your body mass index should be between 23-30. Your Body mass index is 17.86 kg/m. If this is out of the aforementioned range listed, please consider follow up with your Primary Care Provider.  If you are age 76 or younger, your body mass index should be between 19-25. Your Body mass index is 17.86 kg/m. If this is out of the aformentioned range listed, please consider follow up with your Primary Care Provider.   You have been scheduled for a colonoscopy. Please follow written instructions given to you at your visit today.  Please pick up your prep supplies at the pharmacy within the next 1-3 days. If you use inhalers (even only as needed), please bring them with you on the day of your procedure. Your physician has requested that you go to www.startemmi.com and enter the access code given to you at your visit today. This web site gives a general overview about your procedure. However, you should still follow specific instructions given to you by our office regarding your preparation for the procedure.  We have sent the following medications to your pharmacy for you to pick up at your convenience: Reglan 10 mg - TAKE ONE TABLET BEFORE EACH PREP  Thank you for choosing me and [facility].    Ellouise Newer, PA-C

## 2019-05-08 ENCOUNTER — Encounter: Payer: Self-pay | Admitting: Internal Medicine

## 2019-05-08 ENCOUNTER — Other Ambulatory Visit: Payer: Self-pay | Admitting: Internal Medicine

## 2019-05-09 ENCOUNTER — Telehealth: Payer: Self-pay | Admitting: Internal Medicine

## 2019-05-09 NOTE — Telephone Encounter (Signed)

## 2019-05-12 ENCOUNTER — Ambulatory Visit (AMBULATORY_SURGERY_CENTER): Payer: BC Managed Care – PPO | Admitting: Internal Medicine

## 2019-05-12 ENCOUNTER — Other Ambulatory Visit: Payer: Self-pay

## 2019-05-12 ENCOUNTER — Encounter: Payer: Self-pay | Admitting: Internal Medicine

## 2019-05-12 VITALS — BP 105/69 | HR 67 | Temp 97.8°F | Resp 13 | Ht 65.0 in | Wt 109.0 lb

## 2019-05-12 DIAGNOSIS — Z1211 Encounter for screening for malignant neoplasm of colon: Secondary | ICD-10-CM

## 2019-05-12 MED ORDER — SODIUM CHLORIDE 0.9 % IV SOLN: 500.0000 mL | Freq: Once | INTRAVENOUS | Status: DC

## 2019-05-12 NOTE — Progress Notes (Signed)
Temp taken by LS VS taken by CW

## 2019-05-12 NOTE — Patient Instructions (Addendum)
No polyps and an excellent prep!  I still see some diverticulosis - hope the diverticulitis stays away.  Next routine colonoscopy or other screening test in 10 years - 2030  I appreciate the opportunity to care for you. Gatha Mayer, MD, [facility]  Information on diveriticulosis given to you today.  YOU HAD AN ENDOSCOPIC PROCEDURE TODAY AT [facility]:   Refer to the procedure report that was given to you for any specific questions about what was found during the examination.  If the procedure report does not answer your questions, please call your gastroenterologist to clarify.  If you requested that your care partner not be given the details of your procedure findings, then the procedure report has been included in a sealed envelope for you to review at your convenience later.  YOU SHOULD EXPECT: Some feelings of bloating in the abdomen. Passage of more gas than usual.  Walking can help get rid of the air that was put into your GI tract during the procedure and reduce the bloating. If you had a lower endoscopy (such as a colonoscopy or flexible sigmoidoscopy) you may notice spotting of blood in your stool or on the toilet paper. If you underwent a bowel prep for your procedure, you may not have a normal bowel movement for a few days.  Please Note:  You might notice some irritation and congestion in your nose or some drainage.  This is from the oxygen used during your procedure.  There is no need for concern and it should clear up in a day or so.  SYMPTOMS TO REPORT IMMEDIATELY:   Following lower endoscopy (colonoscopy or flexible sigmoidoscopy):  Excessive amounts of blood in the stool  Significant tenderness or worsening of abdominal pains  Swelling of the abdomen that is new, acute  Fever of 100F or higher   For urgent or emergent issues, a gastroenterologist can be reached at any hour by calling [phone].   DIET:  We do recommend a small meal at first, but  then you may proceed to your regular diet.  Drink plenty of fluids but you should avoid alcoholic beverages for 24 hours.  ACTIVITY:  You should plan to take it easy for the rest of today and you should NOT DRIVE or use heavy machinery until tomorrow (because of the sedation medicines used during the test).    FOLLOW UP: Our staff will call the number listed on your records 48-72 hours following your procedure to check on you and address any questions or concerns that you may have regarding the information given to you following your procedure. If we do not reach you, we will leave a message.  We will attempt to reach you two times.  During this call, we will ask if you have developed any symptoms of COVID 19. If you develop any symptoms (ie: fever, flu-like symptoms, shortness of breath, cough etc.) before then, please call [phone].  If you test positive for Covid 19 in the 2 weeks post procedure, please call and report this information to [place].    If any biopsies were taken you will be contacted by phone or by letter within the next 1-3 weeks.  Please call us at [phone] if you have not heard about the biopsies in 3 weeks.    SIGNATURES/CONFIDENTIALITY: You and/or your care partner have signed paperwork which will be entered into your electronic medical record.  These signatures attest to the fact that that the information above  on your After Visit Summary has been reviewed and is understood.  Full responsibility of the confidentiality of this discharge information lies with you and/or your care-partner.

## 2019-05-12 NOTE — Progress Notes (Signed)
To PACU, VSS. Report to [email or web address] 

## 2019-05-12 NOTE — Op Note (Signed)
[facility] Patient Name: Jessica Chan Procedure Date: 05/12/2019 1:48 PM MRN: [id number] Endoscopist: Gatha Mayer , MD Age: 58 Referring MD:  Date of Birth: Jul 08, 1961 Gender: Female Account #: [id number] Procedure:                Colonoscopy Indications:              Screening for colorectal malignant neoplasm Medicines:                Propofol per Anesthesia, Monitored Anesthesia Care Procedure:                Pre-Anesthesia Assessment:                           - Prior to the procedure, a History and Physical                            was performed, and patient medications and                            allergies were reviewed. The patient's tolerance of                            previous anesthesia was also reviewed. The risks                            and benefits of the procedure and the sedation                            options and risks were discussed with the patient.                            All questions were answered, and informed consent                            was obtained. Prior Anticoagulants: The patient has                            taken no previous anticoagulant or antiplatelet                            agents. ASA Grade Assessment: II - A patient with                            mild systemic disease. After reviewing the risks                            and benefits, the patient was deemed in                            satisfactory condition to undergo the procedure.                           After obtaining informed consent, the colonoscope  was passed under direct vision. Throughout the                            procedure, the patient's blood pressure, pulse, and                            oxygen saturations were monitored continuously. The                            Colonoscope was introduced through the anus and                            advanced to the the cecum, identified by   appendiceal orifice and ileocecal valve. The                            colonoscopy was performed without difficulty. The                            patient tolerated the procedure well. The quality                            of the bowel preparation was excellent. The bowel                            preparation used was Miralax via split dose                            instruction. The ileocecal valve, appendiceal                            orifice, and rectum were photographed. Scope In: 1:56:04 PM Scope Out: 2:05:06 PM Scope Withdrawal Time: 0 hours 6 minutes 42 seconds  Total Procedure Duration: 0 hours 9 minutes 2 seconds  Findings:                 The perianal and digital rectal examinations were                            normal.                           Scattered diverticula were found in the left colon.                           There was evidence of a prior end-to-end                            colo-colonic anastomosis in the distal sigmoid                            colon. This was patent and was characterized by                            healthy appearing mucosa.  The exam was otherwise without abnormality on                            direct and retroflexion views. Complications:            No immediate complications. Estimated Blood Loss:     Estimated blood loss: none. Impression:               - Diverticulosis in the left colon.                           - Patent end-to-end colo-colonic anastomosis,                            characterized by healthy appearing mucosa.                           - The examination was otherwise normal on direct                            and retroflexion views.                           - No specimens collected. Recommendation:           - Patient has a contact number available for                            emergencies. The signs and symptoms of potential                            delayed complications were  discussed with the                            patient. Return to normal activities tomorrow.                            Written discharge instructions were provided to the                            patient.                           - Resume previous diet.                           - Continue present medications.                           - Repeat colonoscopy in 10 years for screening                            purposes. Gatha Mayer, MD 05/12/2019 2:12:37 PM This report has been signed electronically.

## 2019-05-14 ENCOUNTER — Telehealth: Payer: Self-pay

## 2019-05-14 NOTE — Telephone Encounter (Signed)
  Follow up Call-  Call back number 05/12/2019 10/07/2018  Post procedure Call Back phone  # [phone]  Permission to leave phone message Yes Yes  Some recent data might be hidden     Patient questions:  Do you have a fever, pain , or abdominal swelling? No. Pain Score  0 *  Have you tolerated food without any problems? Yes.    Have you been able to return to your normal activities? Yes.    Do you have any questions about your discharge instructions: Diet   No. Medications  No. Follow up visit  No.  Do you have questions or concerns about your Care? No.  Actions: * If pain score is 4 or above: No action needed, pain <4.  1.  Have you developed a fever since your procedure? no  2.   Have you had an respiratory symptoms (SOB or cough) since your procedure? no  3.   Have you tested positive for COVID 19 since your procedure no  4.   Have you had any family members/close contacts diagnosed with the COVID 19 since your procedure?  Has a friend she has been around who went to be tested yesterday 05/13/2019 so no results have been received.  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  The patient has had no symptoms but will let us know if the test comes back positive.   If yes to any of these questions please route to Joylene John, RN and Alphonsa Gin, RN.

## 2019-05-15 DIAGNOSIS — Z20828 Contact with and (suspected) exposure to other viral communicable diseases: Secondary | ICD-10-CM | POA: Diagnosis not present

## 2019-05-15 DIAGNOSIS — Z7189 Other specified counseling: Secondary | ICD-10-CM | POA: Diagnosis not present

## 2019-05-19 ENCOUNTER — Other Ambulatory Visit: Payer: Self-pay

## 2019-05-19 DIAGNOSIS — Z20822 Contact with and (suspected) exposure to covid-19: Secondary | ICD-10-CM

## 2019-05-20 LAB — NOVEL CORONAVIRUS, NAA: SARS-CoV-2, NAA: NOT DETECTED

## 2019-05-25 ENCOUNTER — Other Ambulatory Visit: Payer: Self-pay | Admitting: Internal Medicine

## 2019-05-30 ENCOUNTER — Ambulatory Visit: Payer: BC Managed Care – PPO | Admitting: Internal Medicine

## 2019-06-17 ENCOUNTER — Telehealth: Payer: Self-pay | Admitting: Internal Medicine

## 2019-06-17 MED ORDER — GABAPENTIN 100 MG PO CAPS: ORAL_CAPSULE | ORAL | 0 refills | Status: DC

## 2019-06-17 NOTE — Telephone Encounter (Signed)
Pt has been notified that refill has been sent in

## 2019-06-17 NOTE — Telephone Encounter (Signed)
Patient requesting a 90 day supply new Rx gabapentin (NEURONTIN) 100 MG capsule 4x daily right before bed, informed please allow 48 to 72 hour turn around time   [employer] [place], [place] AT [place]

## 2019-06-27 DIAGNOSIS — Z1231 Encounter for screening mammogram for malignant neoplasm of breast: Secondary | ICD-10-CM | POA: Diagnosis not present

## 2019-06-27 LAB — HM MAMMOGRAPHY

## 2019-07-02 ENCOUNTER — Other Ambulatory Visit: Payer: Self-pay

## 2019-07-03 ENCOUNTER — Encounter: Payer: Self-pay | Admitting: Obstetrics & Gynecology

## 2019-07-03 ENCOUNTER — Ambulatory Visit (INDEPENDENT_AMBULATORY_CARE_PROVIDER_SITE_OTHER): Payer: BC Managed Care – PPO | Admitting: Obstetrics & Gynecology

## 2019-07-03 VITALS — BP 110/70 | Ht 65.0 in | Wt 108.0 lb

## 2019-07-03 DIAGNOSIS — G4700 Insomnia, unspecified: Secondary | ICD-10-CM | POA: Diagnosis not present

## 2019-07-03 DIAGNOSIS — Z780 Asymptomatic menopausal state: Secondary | ICD-10-CM

## 2019-07-03 DIAGNOSIS — Z01419 Encounter for gynecological examination (general) (routine) without abnormal findings: Secondary | ICD-10-CM | POA: Diagnosis not present

## 2019-07-03 MED ORDER — ZOLPIDEM TARTRATE 10 MG PO TABS: ORAL_TABLET | ORAL | 2 refills | Status: DC

## 2019-07-03 NOTE — Progress Notes (Signed)
Jessica Chan 11/18/1960 [id number]   History:    58 y.o. G2P2L2 married. Husband retiring 06/2019.  Son 27 yo with autism at home.    RP:  Established patient presenting for annual gyn exam   HPI: Menopause, well on no hormone replacement therapy.  No postmenopausal bleeding.  No pelvic pain.  Sexually active, using Coconut oil.  Urine normal, no SUI.  Bowel movements normal.  Breasts normal.  Body mass index mild but significant decrease to 17.97.  Regular physical activity and healthy nutrition.  BD normal 2016. Difficulty sleeping, would like a re-prescription of Ambien which she uses occasionally.  Health labs with family physician, Dr Regis Bill.  Past medical history,surgical history, family history and social history were all reviewed and documented in the EPIC chart.  Gynecologic History Patient's last menstrual period was 07/25/2011.  Obstetric History OB History  Gravida Para Term Preterm AB Living  2 2       2   SAB TAB Ectopic Multiple Live Births               # Outcome Date GA Lbr Len/2nd Weight Sex Delivery Anes PTL Lv  2 Para     M Vag-Spont     1 Para     M Vag-Spont        ROS: A ROS was performed and pertinent positives and negatives are included in the history.  GENERAL: No fevers or chills. HEENT: No change in vision, no earache, sore throat or sinus congestion. NECK: No pain or stiffness. CARDIOVASCULAR: No chest pain or pressure. No palpitations. PULMONARY: No shortness of breath, cough or wheeze. GASTROINTESTINAL: No abdominal pain, nausea, vomiting or diarrhea, melena or bright red blood per rectum. GENITOURINARY: No urinary frequency, urgency, hesitancy or dysuria. MUSCULOSKELETAL: No joint or muscle pain, no back pain, no recent trauma. DERMATOLOGIC: No rash, no itching, no lesions. ENDOCRINE: No polyuria, polydipsia, no heat or cold intolerance. No recent change in weight. HEMATOLOGICAL: No anemia or easy bruising or bleeding. NEUROLOGIC: No headache,  seizures, numbness, tingling or weakness. PSYCHIATRIC: No depression, no loss of interest in normal activity or change in sleep pattern.     Exam:   BP 110/70   Ht 5\' 5"  (1.651 m)   Wt 108 lb (49 kg)   LMP 07/25/2011   BMI 17.97 kg/m   Body mass index is 17.97 kg/m.  General appearance : Well developed well nourished female. No acute distress HEENT: Eyes: no retinal hemorrhage or exudates,  Neck supple, trachea midline, no carotid bruits, no thyroidmegaly Lungs: Clear to auscultation, no rhonchi or wheezes, or rib retractions  Heart: Regular rate and rhythm, no murmurs or gallops Breast:Examined in sitting and supine position were symmetrical in appearance, no palpable masses or tenderness,  no skin retraction, no nipple inversion, no nipple discharge, no skin discoloration, no axillary or supraclavicular lymphadenopathy Abdomen: no palpable masses or tenderness, no rebound or guarding Extremities: no edema or skin discoloration or tenderness  Pelvic: Vulva: Normal             Vagina: No gross lesions or discharge  Cervix: No gross lesions or discharge  Uterus  AV, normal size, shape and consistency, non-tender and mobile  Adnexa  Without masses or tenderness  Anus: Normal   Assessment/Plan:  58 y.o. female for annual exam   1. Well female exam with routine gynecological exam Normal gynecologic exam in menopause.  Pap test in November 2018 was negative with negative high-risk HPV, will  repeat Pap test next year.  Breast exam normal.  Recent screening mammogram in 2020 was negative per patient, will obtain report.  Body mass index on the low side at 17.97.  Recommend a mild increase in calories, but continue with healthy nutrition and regular physical activities.  Health labs with Dr. Regis Bill.  2. Postmenopausal Well on no hormone replacement therapy.  No postmenopausal bleeding.  We will repeat a bone density in October 2021, bone density October 2016 was normal.  Calcium intake  of 1200 mg daily, vitamin D supplements, regular weightbearing physical activities recommended.  3. Insomnia, unspecified type Counseling on insomnia.  Ambien 10 mg tablet p.o. at bedtime as needed prescribed.  Patient will use sparingly.  Other orders - zolpidem (AMBIEN) 10 MG tablet; TAKE 1 TABLET BY MOUTH EVERY DAY AT BEDTIME AS NEEDED  Princess Bruins MD, 9:10 AM 07/03/2019

## 2019-07-03 NOTE — Patient Instructions (Signed)
1. Well female exam with routine gynecological exam Normal gynecologic exam in menopause.  Pap test in November 2018 was negative with negative high-risk HPV, will repeat Pap test next year.  Breast exam normal.  Recent screening mammogram in 2020 was negative per patient, will obtain report.  Body mass index on the low side at 17.97.  Recommend a mild increase in calories, but continue with healthy nutrition and regular physical activities.  Health labs with Dr. Regis Bill.  2. Postmenopausal Well on no hormone replacement therapy.  No postmenopausal bleeding.  We will repeat a bone density in October 2021, bone density October 2016 was normal.  Calcium intake of 1200 mg daily, vitamin D supplements, regular weightbearing physical activities recommended.  3. Insomnia, unspecified type Counseling on insomnia.  Ambien 10 mg tablet p.o. at bedtime as needed prescribed.  Patient will use sparingly.  Other orders - zolpidem (AMBIEN) 10 MG tablet; TAKE 1 TABLET BY MOUTH EVERY DAY AT BEDTIME AS NEEDED  Jessica Chan, it was a pleasure seeing you today!

## 2019-07-07 ENCOUNTER — Encounter: Payer: Self-pay | Admitting: Internal Medicine

## 2019-07-11 ENCOUNTER — Other Ambulatory Visit: Payer: Self-pay | Admitting: Obstetrics & Gynecology

## 2019-07-11 NOTE — Telephone Encounter (Signed)
I called and spoke with [facility], patient was seen on 07/03/19 and Rx was sent on phone in for Ambien 10 mg tablet. However Rx was never received by pharmacy. I called in Rx from [place] on 07/03/19 with same directions noted at this visit.

## 2019-07-21 ENCOUNTER — Ambulatory Visit: Payer: Self-pay

## 2019-07-21 ENCOUNTER — Telehealth: Payer: BC Managed Care – PPO | Admitting: Internal Medicine

## 2019-07-21 NOTE — Telephone Encounter (Signed)
Incoming call from Patient who is complaining of Stress  Anxiety.   Patient states she got a disturbing. Call 2 days ago.  Anxious and trouble sleeping. Cant eat,  Feeling of being of scared, Cant eat.  Nor sleep.  Attempted to get appointment Transferred call to office to get an appointment for today.             Answer Assessment - Initial Assessment Questions 1. CONCERN: "What happened that made you call today?"   Pt states that she got som disturbing call over the weekend  2. ANXIETY SYMPTOM SCREENING: "Can you describe how you have been feeling?"  (e.g., tense, restless, panicky, anxious, keyed up, trouble sleeping, trouble concentrating)     Anxious trouble sleeping.   3. ONSET: "How long have you been feeling this way?"     2 days 4. RECURRENT: "Have you felt this way before?"  If yes: "What happened that time?" "What helped these feelings go away in the past?"      Denies 5. RISK OF HARM - SUICIDAL IDEATION:  "Do you ever have thoughts of hurting or killing yourself?"  (e.g., yes, no, no but preoccupation with thoughts about death)was not able to complete assessment.  attempent to make appointment with Dr.  Regis Bill.   - INTENT:  "Do you have thoughts of hurting or killing yourself right NOW?" (e.g., yes, no, N/A)   - PLAN: "Do you have a specific plan for how you would do this?" (e.g., gun, knife, overdose, no plan, N/A)     *No Answer* 6. RISK OF HARM - HOMICIDAL IDEATION:  "Do you ever have thoughts of hurting or killing someone else?"  (e.g., yes, no, no but preoccupation with thoughts about death)   - INTENT:  "Do you have thoughts of hurting or killing someone right NOW?" (e.g., yes, no, N/A)   - PLAN: "Do you have a specific plan for how you would do this?" (e.g., gun, knife, no plan, N/A)      *No Answer* 7. FUNCTIONAL IMPAIRMENT: "How have things been going for you overall in your life? Have you had any more difficulties than usual doing your normal daily activities?"  (e.g.,  better, same, worse; self-care, school, work, interactions)     *No Answer* 8. SUPPORT: "Who is with you now?" "Who do you live with?" "Do you have family or friends nearby who you can talk to?"      *No Answer* 9. THERAPIST: "Do you have a counselor or therapist? Name?"     *No Answer* 10. STRESSORS: "Has there been any new stress or recent changes in your life?"       *No Answer* 11. CAFFEINE ABUSE: "Do you drink caffeinated beverages, and how much each day?" (e.g., coffee, tea, colas)       *No Answer* 12. SUBSTANCE ABUSE: "Do you use any illegal drugs or alcohol?"       *No Answer* 13. OTHER SYMPTOMS: "Do you have any other physical symptoms right now?" (e.g., chest pain, palpitations, difficulty breathing, fever)       *No Answer* 14. PREGNANCY: "Is there any chance you are pregnant?" "When was your last menstrual period?"       *No Answer*  Protocols used: ANXIETY AND PANIC ATTACK-A-AH

## 2019-07-22 ENCOUNTER — Encounter: Payer: Self-pay | Admitting: Internal Medicine

## 2019-07-22 ENCOUNTER — Telehealth (INDEPENDENT_AMBULATORY_CARE_PROVIDER_SITE_OTHER): Payer: BC Managed Care – PPO | Admitting: Internal Medicine

## 2019-07-22 ENCOUNTER — Other Ambulatory Visit: Payer: Self-pay

## 2019-07-22 DIAGNOSIS — Z79899 Other long term (current) drug therapy: Secondary | ICD-10-CM | POA: Diagnosis not present

## 2019-07-22 DIAGNOSIS — F418 Other specified anxiety disorders: Secondary | ICD-10-CM

## 2019-07-22 DIAGNOSIS — F410 Panic disorder [episodic paroxysmal anxiety] without agoraphobia: Secondary | ICD-10-CM

## 2019-07-22 DIAGNOSIS — G4700 Insomnia, unspecified: Secondary | ICD-10-CM

## 2019-07-22 DIAGNOSIS — F439 Reaction to severe stress, unspecified: Secondary | ICD-10-CM

## 2019-07-22 MED ORDER — ALPRAZOLAM 0.25 MG PO TABS: 0.2500 mg | ORAL_TABLET | Freq: Three times a day (TID) | ORAL | 0 refills | Status: DC | PRN

## 2019-07-22 MED ORDER — CITALOPRAM HYDROBROMIDE 10 MG PO TABS: 5.0000 mg | ORAL_TABLET | Freq: Every day | ORAL | 3 refills | Status: DC

## 2019-07-22 NOTE — Progress Notes (Signed)
Virtual Visit via Video Note  I connected with@ on 07/22/19 at 10:15 AM EST by a video enabled telemedicine application and verified that I am speaking with the correct person using two identifiers. Location patient: home Location provider:home office Persons participating in the virtual visit: patient, provider  [place] national recommendations  regarding COVID 19 pandemic   video visit is advised over in office visit for this patient.  Patient aware  of the limitations of evaluation and management by telemedicine and  availability of in person appointments. and agreed to proceed.   HPI: Jessica Chan presents for video visit Because of increasing anxiety and history of anxiety attacks recently related to multiple concerns around the family.  Disturbing phone call regarding her son who lives in [place] husband to be retiring family member with Covid at risk.  Changing insurance among other things. In regard to sleep she had gone up to gabapentin 400 mg at night which has been helpful and only using small dose of Ambien add on as needed. 2 cups coffee 1 green tea per day limiting alcohol 1 in the evening   She is tending to worry a lot about a number of things and has had actual panic shaking spell. She had had some Xanax in the past that seem to have helped when another external trigger had occurred and caused an anxiety attack. She has a remote history of being on Wellbutrin. ROS: See pertinent positives and negatives per HPI.  Past Medical History:  Diagnosis Date  . Allergic rhinitis   . Allergy   . Anxiety   . Anxiety disorder   . BLEPHARITIS 05/27/2007   Qualifier: Diagnosis of  By: Hulan Saas, CMA (AAMA), [place]   . Depression    Pt. denies  . Diverticulitis   . Diverticulitis of colon - recurrent 10/02/2013  . Diverticulosis   . ESOPHAGEAL STRICTURE 11/15/2009   Qualifier: Diagnosis of  By: Trellis Paganini PA-c, Amy S   . GERD (gastroesophageal reflux disease)   . Hemangioma of  liver    incidental finding-large, right and left lobes of liver   . Hiatal hernia   . IBS (irritable bowel syndrome)   . PONV (postoperative nausea and vomiting)    SEVERE NAUSEA & VOMITING  . Sinusitis, acute maxillary 07/29/2012   right  hx of sinus surgery  fu if needed recurrecne      Past Surgical History:  Procedure Laterality Date  . adnoidectomy    . COLONOSCOPY    . DILATATION & CURETTAGE/HYSTEROSCOPY WITH MYOSURE N/A 05/23/2016   Procedure: DILATATION & CURETTAGE/HYSTEROSCOPY WITH MYOSURE;  Surgeon: Terrance Mass, MD;  Location: [facility];  Service: Gynecology;  Laterality: N/A;  Request to post this for 12:30pm. That will leave room for a second major case for [place]. If we do not get a 2nd case in the next week I will move it up to follow. Thanks!  Needs one hour.  REQUEST FOR TINA RN AND AUDREY CST  . endoscopy with dialation    . NASAL SINUS SURGERY    . PROCTOSCOPY N/A 07/02/2014   Procedure: PROCTOSCOPY;  Surgeon: Michael Boston, MD;  Location: [facility];  Service: General;  Laterality: N/A;  . ROBOT ASSISTED LAPAROSCOPIC PARTIAL COLECTOMY    . TONSILLECTOMY    . ulnar nerve right arm    . UPPER GASTROINTESTINAL ENDOSCOPY      Family History  Problem Relation Age of Onset  . Lung cancer Mother   . Autism Son   .  Colon cancer Maternal Uncle   . Heart disease Father   . Breast cancer Maternal Grandmother   . Esophageal cancer Neg Hx   . Rectal cancer Neg Hx   . Stomach cancer Neg Hx     Social History   Tobacco Use  . Smoking status: Never Smoker  . Smokeless tobacco: Never Used  Substance Use Topics  . Alcohol use: Yes    Alcohol/week: 0.0 standard drinks    Comment: Social Drinker--SEVERAL DRINKS A WEEK  . Drug use: No      Current Outpatient Medications:  .  ALPRAZolam (XANAX) 0.25 MG tablet, Take 1 tablet (0.25 mg total) by mouth 3 (three) times daily as needed for anxiety (panic attacks)., Disp: 24 tablet, Rfl: 0 .  BIOTIN PO, Take 1 capsule by mouth  daily., Disp: , Rfl:  .  citalopram (CELEXA) 10 MG tablet, Take 0.5 tablets (5 mg total) by mouth daily. Increase to 10 mg per day after 1 week as directed, Disp: 30 tablet, Rfl: 3 .  gabapentin (NEURONTIN) 100 MG capsule, TAKE 1 CAPSULE(100 MG) BY MOUTH AT BEDTIME. INCREASE TO 300 MG BY MOUTH AT BEDTIME AS TOLERATED, Disp: 180 capsule, Rfl: 0 .  Multiple Vitamin (MULTIVITAMIN) tablet, Take 1 tablet by mouth every morning. , Disp: , Rfl:  .  Omega-3 Fatty Acids (FISH OIL PO), Take 1 capsule by mouth daily., Disp: , Rfl:  .  OVER THE COUNTER MEDICATION, somnapure natural sleep aid..  Melatonin, l-theanine, valerian root, hops extract, lemon balm, chamomile flower and passion flower, Disp: , Rfl:  .  tretinoin (RETIN-A) 0.025 % cream, apply to affected area at bedtime TO DRY SKIN FOR ACNE, Disp: , Rfl: 0 .  zolpidem (AMBIEN) 10 MG tablet, TAKE 1 TABLET BY MOUTH AT BEDTIME AS NEEDED, Disp: 30 tablet, Rfl: 2  EXAM: BP Readings from Last 3 Encounters:  07/03/19 110/70  05/12/19 105/69  05/07/19 114/84    VITALS per patient if applicable:  GENERAL: alert, oriented, appears well and in no acute distress  HEENT: atraumatic, conjunttiva clear, no obvious abnormalities on inspection of external nose and ears  NECK: normal movements of the head and neck  LUNGS: on inspection no signs of respiratory distress, breathing rate appears normal, no obvious gross SOB, gasping or wheezing  CV: no obvious cyanosis PSYCH/NEURO: pleasant and cooperative, no obvious depression or some worried  Affect  speech and thought processing grossly intact ASSESSMENT AND PLAN:  Discussed the following assessment and plan:    ICD-10-CM   1. Anxiety attack  F41.0   2. Medication management  Z79.899   3. Insomnia, unspecified type  G47.00   4. Situational anxiety  F41.8   5. Stress  F43.9    Anxiety attacks in the setting of low-grade anxiety that is now more problematic for many external factors. Begin low-dose  citalopram 5 mg a day can increase to 10 mg after a week and use alprazolam as needed for panic rescue. Risk-benefit discussed precautions do not use with alcohol She is on 400 mg of gabapentin at night with help has the option of decreasing that dose if helpful. She has been being given Ambien by her GYN but is only using a quarter of a pill as breakthrough on difficult nights. She has limited alcohol to continue.  And to not use etoh when using alprazolam.   Counseled.   Expectant management and discussion of plan and treatment with opportunity to ask questions and all were answered. The patient  agreed with the plan and demonstrated an understanding of the instructions.   Advised to call back or seek an in-person evaluation if worsening  or having  further concerns . Return in about 3 weeks (around 08/12/2019) for medication or as needed .  Shanon Ace, MD

## 2019-07-23 NOTE — Telephone Encounter (Signed)
So  yes  you could take the  10 mg med in the evening until getting used to it .If you think causes too much sedation

## 2019-07-28 MED ORDER — CITALOPRAM HYDROBROMIDE 20 MG PO TABS: 20.0000 mg | ORAL_TABLET | Freq: Every day | ORAL | 3 refills | Status: DC

## 2019-07-28 NOTE — Telephone Encounter (Signed)
Certainly will be sending in  Citalopram 20 mg tablet to take daily

## 2019-08-05 MED ORDER — GABAPENTIN 100 MG PO CAPS: ORAL_CAPSULE | ORAL | 0 refills | Status: DC

## 2019-08-07 ENCOUNTER — Encounter: Payer: Self-pay | Admitting: Internal Medicine

## 2019-08-16 ENCOUNTER — Other Ambulatory Visit: Payer: Self-pay | Admitting: Internal Medicine

## 2019-08-19 NOTE — Telephone Encounter (Signed)
Glad you are doing better.   Sure we can  Delay the follow up visit   Stay on the same dose. Citalopram  20 mg per day.  We can  Do   cpx visit   In  3-4 months . And check med at that time . [place] can you get her on the scehdule for cpx ?

## 2019-10-15 ENCOUNTER — Other Ambulatory Visit: Payer: Self-pay | Admitting: Internal Medicine

## 2019-10-15 NOTE — Telephone Encounter (Signed)
Message routed to PCP.

## 2019-10-15 NOTE — Telephone Encounter (Signed)
Message routed to PCP    Rx not covered after approving medication refill Rx substitutes will show please pick if appropriate

## 2019-10-19 NOTE — Telephone Encounter (Signed)
Clarify what I am supposed to do with this message   Will the generic gabapentin not be approved  What other  Medications is the insurance company advisin?g

## 2019-10-31 ENCOUNTER — Telehealth: Payer: Self-pay | Admitting: Internal Medicine

## 2019-10-31 MED ORDER — ALPRAZOLAM 0.5 MG PO TABS: 0.5000 mg | ORAL_TABLET | Freq: Three times a day (TID) | ORAL | 0 refills | Status: DC | PRN

## 2019-10-31 NOTE — Telephone Encounter (Signed)
Sorry you are not feeling well  If you need more over the weekend I can send in  A few  More alprazolam 0.5 mg a higher dose    To take 1-2  If needed    Max   3 x per day.

## 2019-10-31 NOTE — Progress Notes (Signed)
This visit occurred during the SARS-CoV-2 public health emergency.  Safety protocols were in place, including screening questions prior to the visit, additional usage of staff PPE, and extensive cleaning of exam room while observing appropriate contact time as indicated for disinfecting solutions.    Chief Complaint  Patient presents with  . Annual Exam    Discuss anxiety and also has an excited feeling when she goes from sitting to standing that started in January and not sure if from Citalopram    HPI: Patient  Jessica Chan  59 y.o. comes in today for [place] visit  And med evalAnxiety    Had covid vaccine. Last   Week did have chills and fever better now  She had Moderna vaccine  Very stressed and worried pacing etc about  Son who is in [place] not sure how he is  . Travel anissue cause of covid shutdown  Gaba has helped sleep and citalopram has helped   Taking alpraz a bit more with help  Gets spells of feeling chest tightenss and pain without sob  Not assic with exercise  Or actions   And alprazolam helps it fade away    Sometimes gets light headed when stands up quickly   tryin gto stay hydrated but forces to eat cause not hundgy uunder this stress.  No syncope .  Panic sx  Worry and sacredness and worry .  And tight  ; feels like nervous breakdown.  Health Maintenance  Topic Date Due  . HIV Screening  Never done  . INFLUENZA VACCINE  02/22/2020  . PAP SMEAR-Modifier  06/21/2020  . MAMMOGRAM  06/26/2021  . TETANUS/TDAP  10/07/2027  . COLONOSCOPY  05/11/2029  . Hepatitis C Screening  Completed   Health Maintenance Review LIFESTYLE:  Exercise:   Not for a while    Yard work  Tobacco/ETS:no Alcohol:  Hardly any . Sugar beverages:   Minimal  Sleep: with gaba   Some help 7 hours some  With gabapentin  400 mg  Melatonin also  Drug use: no HH of  3  Cat   Younger son Autis spectrum disorder doing well      ROS:    See  hpi  GEN/ HEENT: No fever,  significant weight changes sweats headaches vision problems hearing changes, CV/ PULM; No chest pain shortness of breath cough, syncope,edema  change in exercise tolerance. GI /GU: No adominal pain, vomiting, change in bowel habits. No blood in the stool. No significant GU symptoms. SKIN/HEME: ,no acute skin rashes suspicious lesions or bleeding. No lymphadenopathy, nodules, masses.  NEURO/ PSYCH:  No neurologic signs such as weakness numbness. No depression anxiety. IMM/ Allergy: No unusual infections.  Allergy .   REST of 12 system review negative except as per HPI   Past Medical History:  Diagnosis Date  . Allergic rhinitis   . Allergy   . Anxiety   . Anxiety disorder   . BLEPHARITIS 05/27/2007   Qualifier: Diagnosis of  By: Hulan Saas, CMA (AAMA), [place]   . Depression    Pt. denies  . Diverticulitis   . Diverticulitis of colon - recurrent 10/02/2013  . Diverticulosis   . ESOPHAGEAL STRICTURE 11/15/2009   Qualifier: Diagnosis of  By: Trellis Paganini PA-c, Amy S   . GERD (gastroesophageal reflux disease)   . Hemangioma of liver    incidental finding-large, right and left lobes of liver   . Hiatal hernia   . IBS (irritable bowel syndrome)   .  PONV (postoperative nausea and vomiting)    SEVERE NAUSEA & VOMITING  . Sinusitis, acute maxillary 07/29/2012   right  hx of sinus surgery  fu if needed recurrecne      Past Surgical History:  Procedure Laterality Date  . adnoidectomy    . COLONOSCOPY    . DILATATION & CURETTAGE/HYSTEROSCOPY WITH MYOSURE N/A 05/23/2016   Procedure: DILATATION & CURETTAGE/HYSTEROSCOPY WITH MYOSURE;  Surgeon: Terrance Mass, MD;  Location: [facility];  Service: Gynecology;  Laterality: N/A;  Request to post this for 12:30pm. That will leave room for a second major case for [place]. If we do not get a 2nd case in the next week I will move it up to follow. Thanks!  Needs one hour.  REQUEST FOR TINA RN AND AUDREY CST  . endoscopy with dialation    . NASAL SINUS SURGERY     . PROCTOSCOPY N/A 07/02/2014   Procedure: PROCTOSCOPY;  Surgeon: Michael Boston, MD;  Location: [facility];  Service: General;  Laterality: N/A;  . ROBOT ASSISTED LAPAROSCOPIC PARTIAL COLECTOMY    . TONSILLECTOMY    . ulnar nerve right arm    . UPPER GASTROINTESTINAL ENDOSCOPY      Family History  Problem Relation Age of Onset  . Lung cancer Mother   . Autism Son   . Colon cancer Maternal Uncle   . Heart disease Father   . Breast cancer Maternal Grandmother   . Esophageal cancer Neg Hx   . Rectal cancer Neg Hx   . Stomach cancer Neg Hx     Social History   Socioeconomic History  . Marital status: Married    Spouse name: Not on file  . Number of children: 2  . Years of education: Not on file  . Highest education level: Not on file  Occupational History  . Occupation: Physiological scientist: UNEMPLOYED  Tobacco Use  . Smoking status: Never Smoker  . Smokeless tobacco: Never Used  Substance and Sexual Activity  . Alcohol use: Yes    Alcohol/week: 0.0 standard drinks    Comment: Social Drinker--SEVERAL DRINKS A WEEK  . Drug use: No  . Sexual activity: Yes    Partners: Male    Comment: 1st intercourse- 40, partners- 5 married- 39 yrs  Other Topics Concern  . Not on file  Social History Narrative   Married HHof 4 one sone autistic   Husband had prostate cancer in last few years   Some exercise  Runs    No tobacco   Social Determinants of Health   Financial Resource Strain:   . Difficulty of Paying Living Expenses:   Food Insecurity:   . Worried About Charity fundraiser in the Last Year:   . Arboriculturist in the Last Year:   Transportation Needs:   . Film/video editor (Medical):   Marland Kitchen Lack of Transportation (Non-Medical):   Physical Activity:   . Days of Exercise per Week:   . Minutes of Exercise per Session:   Stress:   . Feeling of Stress :   Social Connections:   . Frequency of Communication with Friends and Family:   . Frequency of Social Gatherings  with Friends and Family:   . Attends Religious Services:   . Active Member of Clubs or Organizations:   . Attends Archivist Meetings:   Marland Kitchen Marital Status:     Outpatient Medications Prior to Visit  Medication Sig Dispense Refill  . ALPRAZolam Duanne Moron)  0.5 MG tablet Take 1-2 tablets (0.5-1 mg total) by mouth 3 (three) times daily as needed for anxiety. 20 tablet 0  . BIOTIN PO Take 1 capsule by mouth daily.    . citalopram (CELEXA) 20 MG tablet Take 1 tablet (20 mg total) by mouth daily. 30 tablet 3  . Melatonin 3 MG CAPS Take by mouth. Takes one 3 mg chewy daily    . Multiple Vitamin (MULTIVITAMIN) tablet Take 1 tablet by mouth every morning.     . Omega-3 Fatty Acids (FISH OIL PO) Take 1 capsule by mouth daily.    Marland Kitchen tretinoin (RETIN-A) 0.025 % cream apply to affected area at bedtime TO DRY SKIN FOR ACNE  0  . zolpidem (AMBIEN) 10 MG tablet TAKE 1 TABLET BY MOUTH AT BEDTIME AS NEEDED 30 tablet 2  . gabapentin (NEURONTIN) 100 MG capsule TAKE 1 CAPSULE BY MOUTH AT BEDTIME INCREASE TO 3 CAPSULES AT BEDTIME IF TOLERATED 180 capsule 0  . OVER THE COUNTER MEDICATION somnapure natural sleep aid..  Melatonin, l-theanine, valerian root, hops extract, lemon balm, chamomile flower and passion flower     No facility-administered medications prior to visit.     EXAM:  BP 112/70   Pulse 82   Temp 98 F (36.7 C) (Temporal)   Ht 5\' 5"  (1.651 m)   Wt 105 lb 9.6 oz (47.9 kg)   LMP 07/25/2011   BMI 17.57 kg/m   Body mass index is 17.57 kg/m. Wt Readings from Last 3 Encounters:  11/03/19 105 lb 9.6 oz (47.9 kg)  07/03/19 108 lb (49 kg)  05/12/19 109 lb (49.4 kg)    Physical Exam: Vital signs reviewed [id number] is a well-developed well-nourished alert cooperative    who appearsr stated age in no acute distress.  HEENT: normocephalic atraumatic , Eyes: PERRL EOM's full, conjunctiva clear, Nares:masked , Ears: no deformity EAC's clear TMs with normal landmarks. Mouth: clear  OP,masked NECK: supple without masses, thyromegaly or bruits. CHEST/PULM:  Clear to auscultation and percussion breath sounds equal no wheeze , rales or rhonchi. No chest wall deformities or tenderness. Breast: normal by inspection . No dimpling, discharge, masses, tenderness or discharge . CV: PMI is nondisplaced, S1 S2 no gallops, murmurs, rubs. Peripheral pulses are full without delay.No JVD .  ABDOMEN: Bowel sounds normal nontender  No guard or rebound, no hepato splenomegal no CVA tenderness.  No hernia. Extremtities:  No clubbing cyanosis or edema, no acute joint swelling or redness no focal atrophy NEURO:  Oriented x3, cranial nerves 3-12 appear to be intact, no obvious focal weakness,gait within normal limits no abnormal reflexes or asymmetrical SKIN: No acute rashes normal turgor, color, noor petechiae. 1 cm bruise right ant chest near lower rib cage  No other sx  PSYCH: Oriented, good eye contact, no obvious depression nl though appears tired and stressed  , cognition and judgment appear normal. LN: no cervical axillary inguinal adenopathy  Lab Results  Component Value Date   WBC 3.8 (L) 12/25/2018   HGB 13.2 12/25/2018   HCT 38.6 12/25/2018   PLT 197.0 12/25/2018   GLUCOSE 91 12/25/2018   CHOL 195 12/25/2018   TRIG 75.0 12/25/2018   HDL 67.20 12/25/2018   LDLCALC 113 (H) 12/25/2018   ALT 8 12/25/2018   AST 16 12/25/2018   NA 142 12/25/2018   K 4.6 12/25/2018   CL 104 12/25/2018   CREATININE 0.84 12/25/2018   BUN 14 12/25/2018   CO2 29 12/25/2018   TSH 4.22 12/25/2018  HGBA1C 5.1 12/25/2018    BP Readings from Last 3 Encounters:  11/03/19 112/70  07/03/19 110/70  05/12/19 105/69  EKG sinus  With LAD  Lateral t wave changes V456  One pvc    Upright t V1   Lab plan   ASSESSMENT AND PLAN:  Discussed the following assessment and plan:  Extreme stress reported  From extersnal factors  And weight loss   Etc she feels citalopram and  alprax have been helpful as well  as gabapentin 400 at night for sleep (   Much better than in past )  ekg changes  from 2029  t wave inversions lateral and axis change lad    Read as st elevation but  Appears to be  j point elevation   Has had chest tightness episodes that respond to alprazolam   And is under severe anxiety feeling  Plan urgent cards referral   To ed if gets sx   That last longer than 10 - 15 minutes or assoc sx    ICD-10-CM   1. Visit for preventive health examination  [id number] Basic metabolic panel    CBC with Differential/Platelet    Hepatic function panel    Lipid panel    TSH    T4, free  2. Adjustment reaction with anxiety and depression  F43.23   3. Abnormal EKG change from 2019  R94.31 Ambulatory referral to Cardiology  4. History of chest pain  Z87.898 Ambulatory referral to Cardiology  5. Intermittent palpitations  [id number] Basic metabolic panel    CBC with Differential/Platelet    Hepatic function panel    Lipid panel    TSH    T4, free    Ambulatory referral to Cardiology  6. Anxiety attack  [id number] Basic metabolic panel    CBC with Differential/Platelet    Hepatic function panel    Lipid panel    TSH    T4, free  7. Medication management  123456 Basic metabolic panel    CBC with Differential/Platelet    Hepatic function panel    Lipid panel    TSH    T4, free  8. History of partial colectomy  [id number] Basic metabolic panel    CBC with Differential/Platelet    Hepatic function panel    Lipid panel    TSH    T4, free  9. Hyperlipidemia, unspecified hyperlipidemia type  [id number] Basic metabolic panel    CBC with Differential/Platelet    Hepatic function panel    Lipid panel    TSH    T4, free   Return for depending on results    and cardiology to see. . Disc with pt for now same med and consult   Fu depending on evaluation  Shingles vaccine in  Future  See  Instructions   Above and below  Patient Care Team: Burnis Medin, MD as PCP - General Carlean Purl Ofilia Neas, MD as Attending  Physician (Gastroenterology) Patient Instructions  Although  Sounds like anxiety attacks   Your ekg has changed and is not normal   I want you to see cardiology but if having chest attacks  That last  After alprazolam seek Ed care.   Get shingles vaccine   Vaccine appt   ehwn possible    Health Maintenance, Female Adopting a healthy lifestyle and getting preventive care are important in promoting health and wellness. Ask your health care provider about:  The right schedule for you to have regular tests and exams.  Things you can do on your own to prevent diseases and keep yourself healthy. What should I know about diet, weight, and exercise? Eat a healthy diet   Eat a diet that includes plenty of vegetables, fruits, low-fat dairy products, and lean protein.  Do not eat a lot of foods that are high in solid fats, added sugars, or sodium. Maintain a healthy weight Body mass index (BMI) is used to identify weight problems. It estimates body fat based on height and weight. Your health care provider can help determine your BMI and help you achieve or maintain a healthy weight. Get regular exercise Get regular exercise. This is one of the most important things you can do for your health. Most adults should:  Exercise for at least 150 minutes each week. The exercise should increase your heart rate and make you sweat (moderate-intensity exercise).  Do strengthening exercises at least twice a week. This is in addition to the moderate-intensity exercise.  Spend less time sitting. Even light physical activity can be beneficial. Watch cholesterol and blood lipids Have your blood tested for lipids and cholesterol at 59 years of age, then have this test every 5 years. Have your cholesterol levels checked more often if:  Your lipid or cholesterol levels are high.  You are older than 59 years of age.  You are at high risk for heart disease. What should I know about cancer  screening? Depending on your health history and family history, you may need to have cancer screening at various ages. This may include screening for:  Breast cancer.  Cervical cancer.  Colorectal cancer.  Skin cancer.  Lung cancer. What should I know about heart disease, diabetes, and high blood pressure? Blood pressure and heart disease  High blood pressure causes heart disease and increases the risk of stroke. This is more likely to develop in people who have high blood pressure readings, are of African descent, or are overweight.  Have your blood pressure checked: ? Every 3-5 years if you are 45-75 years of age. ? Every year if you are 83 years old or older. Diabetes Have regular diabetes screenings. This checks your fasting blood sugar level. Have the screening done:  Once every three years after age 9 if you are at a normal weight and have a low risk for diabetes.  More often and at a younger age if you are overweight or have a high risk for diabetes. What should I know about preventing infection? Hepatitis B If you have a higher risk for hepatitis B, you should be screened for this virus. Talk with your health care provider to find out if you are at risk for hepatitis B infection. Hepatitis C Testing is recommended for:  Everyone born from [birth year] through 1965.  Anyone with known risk factors for hepatitis C. Sexually transmitted infections (STIs)  Get screened for STIs, including gonorrhea and chlamydia, if: ? You are sexually active and are younger than 59 years of age. ? You are older than 59 years of age and your health care provider tells you that you are at risk for this type of infection. ? Your sexual activity has changed since you were last screened, and you are at increased risk for chlamydia or gonorrhea. Ask your health care provider if you are at risk.  Ask your health care provider about whether you are at high risk for HIV. Your health care provider may  recommend a prescription medicine to help prevent HIV infection. If you choose  to take medicine to prevent HIV, you should first get tested for HIV. You should then be tested every 3 months for as long as you are taking the medicine. Pregnancy  If you are about to stop having your period (premenopausal) and you may become pregnant, seek counseling before you get pregnant.  Take 400 to 800 micrograms (mcg) of folic acid every day if you become pregnant.  Ask for birth control (contraception) if you want to prevent pregnancy. Osteoporosis and menopause Osteoporosis is a disease in which the bones lose minerals and strength with aging. This can result in bone fractures. If you are 27 years old or older, or if you are at risk for osteoporosis and fractures, ask your health care provider if you should:  Be screened for bone loss.  Take a calcium or vitamin D supplement to lower your risk of fractures.  Be given hormone replacement therapy (HRT) to treat symptoms of menopause. Follow these instructions at home: Lifestyle  Do not use any products that contain nicotine or tobacco, such as cigarettes, e-cigarettes, and chewing tobacco. If you need help quitting, ask your health care provider.  Do not use street drugs.  Do not share needles.  Ask your health care provider for help if you need support or information about quitting drugs. Alcohol use  Do not drink alcohol if: ? Your health care provider tells you not to drink. ? You are pregnant, may be pregnant, or are planning to become pregnant.  If you drink alcohol: ? Limit how much you use to 0-1 drink a day. ? Limit intake if you are breastfeeding.  Be aware of how much alcohol is in your drink. In the [place] one drink equals one 12 oz bottle of beer (355 mL), one 5 oz glass of wine (148 mL), or one 1 oz glass of hard liquor (44 mL). General instructions  Schedule regular health, dental, and eye exams.  Stay current with your  vaccines.  Tell your health care provider if: ? You often feel depressed. ? You have ever been abused or do not feel safe at home. Summary  Adopting a healthy lifestyle and getting preventive care are important in promoting health and wellness.  Follow your health care provider's instructions about healthy diet, exercising, and getting tested or screened for diseases.  Follow your health care provider's instructions on monitoring your cholesterol and blood pressure. This information is not intended to replace advice given to you by your health care provider. Make sure you discuss any questions you have with your health care provider. Document Revised: 07/03/2018 Document Reviewed: 07/03/2018 Elsevier Patient Education  2020 [employer] Selah Zelman M.D.

## 2019-11-03 ENCOUNTER — Encounter: Payer: Self-pay | Admitting: Internal Medicine

## 2019-11-03 ENCOUNTER — Other Ambulatory Visit: Payer: Self-pay

## 2019-11-03 ENCOUNTER — Ambulatory Visit (INDEPENDENT_AMBULATORY_CARE_PROVIDER_SITE_OTHER): Payer: 59 | Admitting: Internal Medicine

## 2019-11-03 VITALS — BP 112/70 | HR 82 | Temp 98.0°F | Ht 65.0 in | Wt 105.6 lb

## 2019-11-03 DIAGNOSIS — F410 Panic disorder [episodic paroxysmal anxiety] without agoraphobia: Secondary | ICD-10-CM | POA: Diagnosis not present

## 2019-11-03 DIAGNOSIS — Z79899 Other long term (current) drug therapy: Secondary | ICD-10-CM | POA: Diagnosis not present

## 2019-11-03 DIAGNOSIS — Z87898 Personal history of other specified conditions: Secondary | ICD-10-CM

## 2019-11-03 DIAGNOSIS — R9431 Abnormal electrocardiogram [ECG] [EKG]: Secondary | ICD-10-CM | POA: Diagnosis not present

## 2019-11-03 DIAGNOSIS — Z9049 Acquired absence of other specified parts of digestive tract: Secondary | ICD-10-CM

## 2019-11-03 DIAGNOSIS — F4323 Adjustment disorder with mixed anxiety and depressed mood: Secondary | ICD-10-CM | POA: Diagnosis not present

## 2019-11-03 DIAGNOSIS — Z0000 Encounter for general adult medical examination without abnormal findings: Secondary | ICD-10-CM

## 2019-11-03 DIAGNOSIS — E785 Hyperlipidemia, unspecified: Secondary | ICD-10-CM

## 2019-11-03 DIAGNOSIS — R002 Palpitations: Secondary | ICD-10-CM

## 2019-11-03 LAB — HEPATIC FUNCTION PANEL
ALT: 10 U/L (ref 0–35)
AST: 16 U/L (ref 0–37)
Albumin: 4.9 g/dL (ref 3.5–5.2)
Alkaline Phosphatase: 57 U/L (ref 39–117)
Bilirubin, Direct: 0.1 mg/dL (ref 0.0–0.3)
Total Bilirubin: 0.7 mg/dL (ref 0.2–1.2)
Total Protein: 6.8 g/dL (ref 6.0–8.3)

## 2019-11-03 LAB — CBC WITH DIFFERENTIAL/PLATELET
Basophils Absolute: 0 10*3/uL (ref 0.0–0.1)
Basophils Relative: 0.5 % (ref 0.0–3.0)
Eosinophils Absolute: 0 10*3/uL (ref 0.0–0.7)
Eosinophils Relative: 0.9 % (ref 0.0–5.0)
HCT: 39.3 % (ref 36.0–46.0)
Hemoglobin: 13.4 g/dL (ref 12.0–15.0)
Lymphocytes Relative: 25.3 % (ref 12.0–46.0)
Lymphs Abs: 1.3 10*3/uL (ref 0.7–4.0)
MCHC: 34.1 g/dL (ref 30.0–36.0)
MCV: 94.1 fl (ref 78.0–100.0)
Monocytes Absolute: 0.4 10*3/uL (ref 0.1–1.0)
Monocytes Relative: 7.4 % (ref 3.0–12.0)
Neutro Abs: 3.3 10*3/uL (ref 1.4–7.7)
Neutrophils Relative %: 65.9 % (ref 43.0–77.0)
Platelets: 212 10*3/uL (ref 150.0–400.0)
RBC: 4.18 Mil/uL (ref 3.87–5.11)
RDW: 13.1 % (ref 11.5–15.5)
WBC: 5.1 10*3/uL (ref 4.0–10.5)

## 2019-11-03 LAB — LIPID PANEL
Cholesterol: 186 mg/dL (ref 0–200)
HDL: 66.3 mg/dL (ref >39.00)
LDL Cholesterol: 106 mg/dL — ABNORMAL HIGH (ref 0–99)
NonHDL: 120.12
Total CHOL/HDL Ratio: 3
Triglycerides: 71 mg/dL (ref 0.0–149.0)
VLDL: 14.2 mg/dL (ref 0.0–40.0)

## 2019-11-03 LAB — BASIC METABOLIC PANEL
BUN: 15 mg/dL (ref 6–23)
CO2: 31 mEq/L (ref 19–32)
Calcium: 9.4 mg/dL (ref 8.4–10.5)
Chloride: 101 mEq/L (ref 96–112)
Creatinine, Ser: 0.81 mg/dL (ref 0.40–1.20)
GFR: 72.33 mL/min (ref >60.00)
Glucose, Bld: 89 mg/dL (ref 70–99)
Potassium: 4.5 mEq/L (ref 3.5–5.1)
Sodium: 140 mEq/L (ref 135–145)

## 2019-11-03 LAB — T4, FREE: Free T4: 1.02 ng/dL (ref 0.60–1.60)

## 2019-11-03 LAB — TSH: TSH: 2.78 u[IU]/mL (ref 0.35–4.50)

## 2019-11-03 MED ORDER — GABAPENTIN 100 MG PO CAPS: ORAL_CAPSULE | ORAL | 1 refills | Status: DC

## 2019-11-03 NOTE — Patient Instructions (Addendum)
Although  Sounds like anxiety attacks   Your ekg has changed and is not normal   I want you to see cardiology but if having chest attacks  That last  After alprazolam seek Ed care.   Get shingles vaccine   Vaccine appt   ehwn possible    Health Maintenance, Female Adopting a healthy lifestyle and getting preventive care are important in promoting health and wellness. Ask your health care provider about:  The right schedule for you to have regular tests and exams.  Things you can do on your own to prevent diseases and keep yourself healthy. What should I know about diet, weight, and exercise? Eat a healthy diet   Eat a diet that includes plenty of vegetables, fruits, low-fat dairy products, and lean protein.  Do not eat a lot of foods that are high in solid fats, added sugars, or sodium. Maintain a healthy weight Body mass index (BMI) is used to identify weight problems. It estimates body fat based on height and weight. Your health care provider can help determine your BMI and help you achieve or maintain a healthy weight. Get regular exercise Get regular exercise. This is one of the most important things you can do for your health. Most adults should:  Exercise for at least 150 minutes each week. The exercise should increase your heart rate and make you sweat (moderate-intensity exercise).  Do strengthening exercises at least twice a week. This is in addition to the moderate-intensity exercise.  Spend less time sitting. Even light physical activity can be beneficial. Watch cholesterol and blood lipids Have your blood tested for lipids and cholesterol at 59 years of age, then have this test every 5 years. Have your cholesterol levels checked more often if:  Your lipid or cholesterol levels are high.  You are older than 59 years of age.  You are at high risk for heart disease. What should I know about cancer screening? Depending on your health history and family history, you may  need to have cancer screening at various ages. This may include screening for:  Breast cancer.  Cervical cancer.  Colorectal cancer.  Skin cancer.  Lung cancer. What should I know about heart disease, diabetes, and high blood pressure? Blood pressure and heart disease  High blood pressure causes heart disease and increases the risk of stroke. This is more likely to develop in people who have high blood pressure readings, are of African descent, or are overweight.  Have your blood pressure checked: ? Every 3-5 years if you are 42-32 years of age. ? Every year if you are 19 years old or older. Diabetes Have regular diabetes screenings. This checks your fasting blood sugar level. Have the screening done:  Once every three years after age 82 if you are at a normal weight and have a low risk for diabetes.  More often and at a younger age if you are overweight or have a high risk for diabetes. What should I know about preventing infection? Hepatitis B If you have a higher risk for hepatitis B, you should be screened for this virus. Talk with your health care provider to find out if you are at risk for hepatitis B infection. Hepatitis C Testing is recommended for:  Everyone born from [birth year] through 1965.  Anyone with known risk factors for hepatitis C. Sexually transmitted infections (STIs)  Get screened for STIs, including gonorrhea and chlamydia, if: ? You are sexually active and are younger than 59 years of  age. ? You are older than 59 years of age and your health care provider tells you that you are at risk for this type of infection. ? Your sexual activity has changed since you were last screened, and you are at increased risk for chlamydia or gonorrhea. Ask your health care provider if you are at risk.  Ask your health care provider about whether you are at high risk for HIV. Your health care provider may recommend a prescription medicine to help prevent HIV infection. If you  choose to take medicine to prevent HIV, you should first get tested for HIV. You should then be tested every 3 months for as long as you are taking the medicine. Pregnancy  If you are about to stop having your period (premenopausal) and you may become pregnant, seek counseling before you get pregnant.  Take 400 to 800 micrograms (mcg) of folic acid every day if you become pregnant.  Ask for birth control (contraception) if you want to prevent pregnancy. Osteoporosis and menopause Osteoporosis is a disease in which the bones lose minerals and strength with aging. This can result in bone fractures. If you are 54 years old or older, or if you are at risk for osteoporosis and fractures, ask your health care provider if you should:  Be screened for bone loss.  Take a calcium or vitamin D supplement to lower your risk of fractures.  Be given hormone replacement therapy (HRT) to treat symptoms of menopause. Follow these instructions at home: Lifestyle  Do not use any products that contain nicotine or tobacco, such as cigarettes, e-cigarettes, and chewing tobacco. If you need help quitting, ask your health care provider.  Do not use street drugs.  Do not share needles.  Ask your health care provider for help if you need support or information about quitting drugs. Alcohol use  Do not drink alcohol if: ? Your health care provider tells you not to drink. ? You are pregnant, may be pregnant, or are planning to become pregnant.  If you drink alcohol: ? Limit how much you use to 0-1 drink a day. ? Limit intake if you are breastfeeding.  Be aware of how much alcohol is in your drink. In the [place] one drink equals one 12 oz bottle of beer (355 mL), one 5 oz glass of wine (148 mL), or one 1 oz glass of hard liquor (44 mL). General instructions  Schedule regular health, dental, and eye exams.  Stay current with your vaccines.  Tell your health care provider if: ? You often feel  depressed. ? You have ever been abused or do not feel safe at home. Summary  Adopting a healthy lifestyle and getting preventive care are important in promoting health and wellness.  Follow your health care provider's instructions about healthy diet, exercising, and getting tested or screened for diseases.  Follow your health care provider's instructions on monitoring your cholesterol and blood pressure. This information is not intended to replace advice given to you by your health care provider. Make sure you discuss any questions you have with your health care provider. Document Revised: 07/03/2018 Document Reviewed: 07/03/2018 Elsevier Patient Education  2020 [employer].

## 2019-11-04 NOTE — Progress Notes (Signed)
Blood work is normal or in  good range

## 2019-11-04 NOTE — Progress Notes (Signed)
Cardiology Office Note   Date:  11/06/2019   ID:  Jessica Chan, DOB 04-01-1961, MRN [id number]  PCP:  Burnis Medin, MD  Cardiologist:   Giankarlo Leamer Martinique, MD   Chief Complaint  Patient presents with  . Chest Pain  . Palpitations      History of Present Illness: Jessica Chan is a 59 y.o. female who is seen at the request of Dr Regis Bill for evaluation of palpitations and chest pain. She is generally healthy. Since December she has been under a lot of stress. She has a son living in [place] and is concerned about his well being. She has been very anxious and having panic attacks. States she had a "nervous breakdown" in January where she couldn't even function. She was placed on Citalopram with some improvement. She does note that she has episodes of severe chest pain. It feels like something is squeezing her chest, her throat feels constricted and she has a hard time eating. These episodes always occur at rest and not with exertion. She is having symptoms every day. Was seen by Dr Regis Bill yesterday and Ecg was abnormal. She cannot recall if she was actually having pain at the time of the Ecg. She has no history of DM, HTN, HLD, or tobacco use. Doesn't really know her parents medical history except that both of them had cancer.     Past Medical History:  Diagnosis Date  . Allergic rhinitis   . Allergy   . Anxiety   . Anxiety disorder   . BLEPHARITIS 05/27/2007   Qualifier: Diagnosis of  By: Hulan Saas, CMA (AAMA), [place] Skye   . Depression    Pt. denies  . Diverticulitis   . Diverticulitis of colon - recurrent 10/02/2013  . Diverticulosis   . ESOPHAGEAL STRICTURE 11/15/2009   Qualifier: Diagnosis of  By: Trellis Paganini PA-c, Amy S   . GERD (gastroesophageal reflux disease)   . Hemangioma of liver    incidental finding-large, right and left lobes of liver   . Hiatal hernia   . IBS (irritable bowel syndrome)   . PONV (postoperative nausea and vomiting)    SEVERE NAUSEA & VOMITING  .  Sinusitis, acute maxillary 07/29/2012   right  hx of sinus surgery  fu if needed recurrecne      Past Surgical History:  Procedure Laterality Date  . adnoidectomy    . COLONOSCOPY    . DILATATION & CURETTAGE/HYSTEROSCOPY WITH MYOSURE N/A 05/23/2016   Procedure: DILATATION & CURETTAGE/HYSTEROSCOPY WITH MYOSURE;  Surgeon: Terrance Mass, MD;  Location: [facility];  Service: Gynecology;  Laterality: N/A;  Request to post this for 12:30pm. That will leave room for a second major case for [place]. If we do not get a 2nd case in the next week I will move it up to follow. Thanks!  Needs one hour.  REQUEST FOR TINA RN AND AUDREY CST  . endoscopy with dialation    . NASAL SINUS SURGERY    . PROCTOSCOPY N/A 07/02/2014   Procedure: PROCTOSCOPY;  Surgeon: Michael Boston, MD;  Location: [facility];  Service: General;  Laterality: N/A;  . ROBOT ASSISTED LAPAROSCOPIC PARTIAL COLECTOMY    . TONSILLECTOMY    . ulnar nerve right arm    . UPPER GASTROINTESTINAL ENDOSCOPY       Current Outpatient Medications  Medication Sig Dispense Refill  . ALPRAZolam (XANAX) 0.5 MG tablet Take 1-2 tablets (0.5-1 mg total) by mouth 3 (three) times daily as needed for anxiety.  20 tablet 0  . BIOTIN PO Take 1 capsule by mouth daily.    . citalopram (CELEXA) 20 MG tablet Take 1 tablet (20 mg total) by mouth daily. 30 tablet 3  . gabapentin (NEURONTIN) 100 MG capsule Take 300-400 mg hs po  or as directed 180 capsule 1  . Melatonin 3 MG CAPS Take by mouth. Takes one 3 mg chewy daily    . Multiple Vitamin (MULTIVITAMIN) tablet Take 1 tablet by mouth every morning.     . Omega-3 Fatty Acids (FISH OIL PO) Take 1 capsule by mouth daily.    Marland Kitchen OVER THE COUNTER MEDICATION somnapure natural sleep aid..  Melatonin, l-theanine, valerian root, hops extract, lemon balm, chamomile flower and passion flower    . tretinoin (RETIN-A) 0.025 % cream apply to affected area at bedtime TO DRY SKIN FOR ACNE  0  . zolpidem (AMBIEN) 10 MG tablet TAKE 1  TABLET BY MOUTH AT BEDTIME AS NEEDED 30 tablet 2  . diltiazem (DILT-XR) 180 MG 24 hr capsule Take 1 capsule (180 mg total) by mouth daily. 30 capsule 3  . metoprolol tartrate (LOPRESSOR) 100 MG tablet Take 100 mg 2 hours before Coronary CT 1 tablet 0  . nitroGLYCERIN (NITROSTAT) 0.4 MG SL tablet Place 1 tablet (0.4 mg total) under the tongue every 5 (five) minutes as needed for chest pain. 90 tablet 3   No current facility-administered medications for this visit.    Allergies:   Shellfish allergy, Demerol [meperidine], Amoxicillin-pot clavulanate, Meperidine hcl, and Oxycodone    Social History:  The patient  reports that she has never smoked. She has never used smokeless tobacco. She reports current alcohol use. She reports that she does not use drugs.   Family History:  The patient's family history includes Autism in her son; Breast cancer in her maternal grandmother; Colon cancer in her maternal uncle; Heart disease in her father; Lung cancer in her father and mother.    ROS:  Please see the history of present illness.   Otherwise, review of systems are positive for none.   All other systems are reviewed and negative.    PHYSICAL EXAM: VS:  BP 132/70   Pulse 89   Ht 5\' 5"  (1.651 m)   Wt 107 lb (48.5 kg)   LMP 07/25/2011   SpO2 98%   BMI 17.81 kg/m  , BMI Body mass index is 17.81 kg/m. GEN: very thin, well developed EF, anxious HEENT: normal  Neck: no JVD, carotid bruits, or masses Cardiac: RRR; no murmurs, rubs, or gallops,no edema  Respiratory:  clear to auscultation bilaterally, normal work of breathing GI: soft, nontender, nondistended, + BS MS: no deformity or atrophy  Skin: warm and dry, no rash Neuro:  Strength and sensation are intact Psych: euthymic mood, full affect   EKG:  EKG is ordered today. The ekg ordered today demonstrates NSR with minor nonspecific ST abnormality.  EKG done yesterday showed NSR with occ PVC. There appears to be mild ST elevation in leads  V1-4 with T wave inversion in V5-6.    Recent Labs: 11/03/2019: ALT 10; BUN 15; Creatinine, Ser 0.81; Hemoglobin 13.4; Platelets 212.0; Potassium 4.5; Sodium 140; TSH 2.78    Lipid Panel    Component Value Date/Time   CHOL 186 11/03/2019 1014   TRIG 71.0 11/03/2019 1014   HDL 66.30 11/03/2019 1014   CHOLHDL 3 11/03/2019 1014   VLDL 14.2 11/03/2019 1014   LDLCALC 106 (H) 11/03/2019 1014  Wt Readings from Last 3 Encounters:  11/06/19 107 lb (48.5 kg)  11/03/19 105 lb 9.6 oz (47.9 kg)  07/03/19 108 lb (49 kg)      Other studies Reviewed: Additional studies/ records that were reviewed today include: none   ASSESSMENT AND PLAN:  1.  Chest pain. Bases on dynamic Ecg findings and history I think she most likely has vasospastic Coronary disease. Fairly classic symptoms. Most likely triggered by emotional stress. I have recommended a trial of therapy with diltiazem XR 180 mg daily. I have also given her Ntg sl to take prn and instructed her in its use. She has no significant risk factors for atherosclerotic disease. Will order a coronary CTA to assess coronary arteries. Will follow up after study to assess response to therapy.    Current medicines are reviewed at length with the patient today.  The patient does not have concerns regarding medicines.  The following changes have been made:  See above  Labs/ tests ordered today include:   Orders Placed This Encounter  Procedures  . CT CORONARY MORPH W/CTA COR W/SCORE W/CA W/CM &/OR WO/CM  . CT CORONARY FRACTIONAL FLOW RESERVE DATA PREP  . CT CORONARY FRACTIONAL FLOW RESERVE FLUID ANALYSIS  . Basic metabolic panel  . EKG 12-Lead     Disposition:   FU with me post CTA  Signed, Jourden Gilson Martinique, MD  11/06/2019 2:27 PM    [facility] [address], [place], [place], [address] Phone [phone], Fax [phone]

## 2019-11-05 ENCOUNTER — Encounter: Payer: Self-pay | Admitting: Internal Medicine

## 2019-11-06 ENCOUNTER — Ambulatory Visit (INDEPENDENT_AMBULATORY_CARE_PROVIDER_SITE_OTHER): Payer: 59 | Admitting: Cardiology

## 2019-11-06 ENCOUNTER — Encounter: Payer: Self-pay | Admitting: Cardiology

## 2019-11-06 ENCOUNTER — Other Ambulatory Visit: Payer: Self-pay

## 2019-11-06 VITALS — BP 132/70 | HR 89 | Ht 65.0 in | Wt 107.0 lb

## 2019-11-06 DIAGNOSIS — F411 Generalized anxiety disorder: Secondary | ICD-10-CM | POA: Diagnosis not present

## 2019-11-06 DIAGNOSIS — R9431 Abnormal electrocardiogram [ECG] [EKG]: Secondary | ICD-10-CM

## 2019-11-06 DIAGNOSIS — R072 Precordial pain: Secondary | ICD-10-CM | POA: Diagnosis not present

## 2019-11-06 DIAGNOSIS — I201 Angina pectoris with documented spasm: Secondary | ICD-10-CM

## 2019-11-06 MED ORDER — METOPROLOL TARTRATE 100 MG PO TABS: ORAL_TABLET | ORAL | 0 refills | Status: DC

## 2019-11-06 MED ORDER — NITROGLYCERIN 0.4 MG SL SUBL: 0.4000 mg | SUBLINGUAL_TABLET | SUBLINGUAL | 3 refills | Status: DC | PRN

## 2019-11-06 MED ORDER — DILTIAZEM HCL ER 180 MG PO CP24: 180.0000 mg | ORAL_CAPSULE | Freq: Every day | ORAL | 3 refills | Status: DC

## 2019-11-06 NOTE — Patient Instructions (Addendum)
Take Diltiazem XR 180 mg daily  If you have chest pain take sl Ntg   We will schedule you for a coronary CT angiogram.    Your cardiac CT will be scheduled at one of the below locations:   [facility] [address] [place], [place] [address] (336) [place] [address] [place], [place] [address] [phone]  If scheduled at [facility], please arrive at the [facility] main entrance of [facility] 30 minutes prior to test start time. Proceed to the [facility] Radiology Department (first floor) to check-in and test prep.  If scheduled at [facility], please arrive 15 mins early for check-in and test prep.  Please follow these instructions carefully (unless otherwise directed):    On the Night Before the Test: . Be sure to Drink plenty of water. . Do not consume any caffeinated/decaffeinated beverages or chocolate 12 hours prior to your test. . Do not take any antihistamines 12 hours prior to your test.   On the Day of the Test: . Drink plenty of water. Do not drink any water within one hour of the test. . Do not eat any food 4 hours prior to the test. . You may take your regular medications prior to the test.  . Take metoprolol 100 mg two hours prior to test. . FEMALES- please wear underwire-free bra if available          After the Test: . Drink plenty of water. . After receiving IV contrast, you may experience a mild flushed feeling. This is normal. . On occasion, you may experience a mild rash up to 24 hours after the test. This is not dangerous. If this occurs, you can take Benadryl 25 mg and increase your fluid intake. . If you experience trouble breathing, this can be serious. If it is severe call 911 IMMEDIATELY. If it is mild, please call our office.    Once we have confirmed authorization from your insurance company, we will call you to  set up a date and time for your test.   For non-scheduling related questions, please contact the cardiac imaging nurse navigator should you have any questions/concerns: Marchia Bond, RN Navigator Cardiac Imaging [facility] [phone] office  For scheduling needs, including cancellations and rescheduling, please call [phone].

## 2019-11-07 ENCOUNTER — Other Ambulatory Visit: Payer: Self-pay

## 2019-11-12 ENCOUNTER — Other Ambulatory Visit: Payer: Self-pay | Admitting: Internal Medicine

## 2019-11-12 MED ORDER — ALPRAZOLAM 0.5 MG PO TABS: 0.5000 mg | ORAL_TABLET | Freq: Two times a day (BID) | ORAL | 0 refills | Status: DC | PRN

## 2019-11-12 MED ORDER — CITALOPRAM HYDROBROMIDE 20 MG PO TABS: 30.0000 mg | ORAL_TABLET | Freq: Every day | ORAL | 3 refills | Status: DC

## 2019-11-12 NOTE — Telephone Encounter (Signed)
Ok to increase citalopram to 30 mg per day   I am sending in send ing in 20 mg to take 1.2 per day   Disp 45 refill x 3  And will refill the alprazolam    Make sure  They know that tteddi and John know that they  have to call and make the psych appt  ( see  Result note for Jenny Reichmann)

## 2019-11-12 NOTE — Telephone Encounter (Signed)
I spoke with patient. She started Cardizem on Saturday. After taking she had dizziness and felt "not right".  She has not taken any more and is feeling better.  Reports she is checking BP and it is running 113/70-75. Not having spasms and is feeling much better than when she saw Dr Martinique. She is also asking if CT needs to be done right away.  I told her it usually takes at least 4 weeks or so to get CT scheduled.  She would like to continue with scheduling process.  She will stay off Cardizem for now and continue to monitor BP and heart rate. I told patient I would make Dr Martinique aware and we would call her back if he wanted to make any changes

## 2019-11-13 NOTE — Telephone Encounter (Signed)
CT is not urgent but is necessary. She has the sl NTG to use if she has more chest pain. If the episodes recur ( she was having them daily) then I would like to try her on a lower dose of diltiazem  Leather Estis Martinique MD, [facility]

## 2019-11-14 MED ORDER — CITALOPRAM HYDROBROMIDE 20 MG PO TABS: 30.0000 mg | ORAL_TABLET | Freq: Every day | ORAL | 3 refills | Status: DC

## 2019-11-14 NOTE — Telephone Encounter (Signed)
The pharmacy   is incorrect and may have refilled  A different rx  I had already sent in the 30 mg  And refills  Correctly on April  21   . I will send it in again as disp 45  Take 30mg   per day

## 2019-12-08 ENCOUNTER — Other Ambulatory Visit: Payer: Self-pay | Admitting: Internal Medicine

## 2020-02-29 ENCOUNTER — Other Ambulatory Visit: Payer: Self-pay | Admitting: Internal Medicine

## 2020-04-02 NOTE — Telephone Encounter (Signed)
Had visits this afternoon and says feeling better

## 2020-04-05 MED ORDER — ALPRAZOLAM 0.5 MG PO TABS: 0.5000 mg | ORAL_TABLET | Freq: Two times a day (BID) | ORAL | 0 refills | Status: DC | PRN

## 2020-04-05 NOTE — Telephone Encounter (Signed)
Sorry things are stressful. I sent in a refill of the alprazolam   To use as needed ( no alcohol) .

## 2020-05-06 ENCOUNTER — Other Ambulatory Visit: Payer: Self-pay | Admitting: Obstetrics & Gynecology

## 2020-05-06 NOTE — Telephone Encounter (Signed)
Last filled on 07/20/20 with 2 refills

## 2020-05-19 ENCOUNTER — Other Ambulatory Visit: Payer: Self-pay | Admitting: Internal Medicine

## 2020-06-05 ENCOUNTER — Ambulatory Visit: Payer: 59 | Attending: Internal Medicine

## 2020-06-05 DIAGNOSIS — Z23 Encounter for immunization: Secondary | ICD-10-CM

## 2020-06-05 NOTE — Progress Notes (Signed)
° °  Covid-19 Vaccination Clinic  Name:  Jessica Chan    MRN: [id number] DOB: 10-28-1960  06/05/2020  [name] was observed post Covid-19 immunization for 15 minutes without incident. She was provided with Vaccine Information Sheet and instruction to access the V-Safe system.   [name] was instructed to call 911 with any severe reactions post vaccine:  Difficulty breathing   Swelling of face and throat   A fast heartbeat   A bad rash all over body   Dizziness and weakness   Immunizations Administered    No immunizations on file.

## 2020-07-01 ENCOUNTER — Other Ambulatory Visit: Payer: Self-pay | Admitting: Internal Medicine

## 2020-07-01 DIAGNOSIS — Z1231 Encounter for screening mammogram for malignant neoplasm of breast: Secondary | ICD-10-CM

## 2020-07-08 ENCOUNTER — Ambulatory Visit (INDEPENDENT_AMBULATORY_CARE_PROVIDER_SITE_OTHER): Payer: 59 | Admitting: Obstetrics & Gynecology

## 2020-07-08 ENCOUNTER — Encounter: Payer: Self-pay | Admitting: Obstetrics & Gynecology

## 2020-07-08 ENCOUNTER — Other Ambulatory Visit: Payer: Self-pay

## 2020-07-08 VITALS — BP 130/82 | Ht 65.0 in | Wt 103.0 lb

## 2020-07-08 DIAGNOSIS — Z780 Asymptomatic menopausal state: Secondary | ICD-10-CM | POA: Diagnosis not present

## 2020-07-08 DIAGNOSIS — Z01419 Encounter for gynecological examination (general) (routine) without abnormal findings: Secondary | ICD-10-CM | POA: Diagnosis not present

## 2020-07-08 NOTE — Progress Notes (Signed)
Jessica Chan [birth year] [id number]   History:    59 y.o. G2P2L2 married. Husband retired 06/2019.  Son 25 yo with autism at home.  Oldest son in [place].  NW:[id number] presenting for annual gyn exam   XBM:[id number], well on no hormone replacement therapy. No postmenopausal bleeding. No pelvic pain. Sexually active, using Coconut oil. Urine normal, no SUI. Bowel movements normal. Breasts normal. Body mass index 17.14. Exercising on-off and healthy nutrition.  BD normal 2016.  Colono 2020.  Health labs with family physician, Dr Regis Bill.    Past medical history,surgical history, family history and social history were all reviewed and documented in the EPIC chart.  Gynecologic History Patient's last menstrual period was 07/25/2011.  Obstetric History OB History  Gravida Para Term Preterm AB Living  2 2       2   SAB IAB Ectopic Multiple Live Births               # Outcome Date GA Lbr Len/2nd Weight Sex Delivery Anes PTL Lv  2 Para     M Vag-Spont     1 Para     M Vag-Spont        ROS: A ROS was performed and pertinent positives and negatives are included in the history.  GENERAL: No fevers or chills. HEENT: No change in vision, no earache, sore throat or sinus congestion. NECK: No pain or stiffness. CARDIOVASCULAR: No chest pain or pressure. No palpitations. PULMONARY: No shortness of breath, cough or wheeze. GASTROINTESTINAL: No abdominal pain, nausea, vomiting or diarrhea, melena or bright red blood per rectum. GENITOURINARY: No urinary frequency, urgency, hesitancy or dysuria. MUSCULOSKELETAL: No joint or muscle pain, no back pain, no recent trauma. DERMATOLOGIC: No rash, no itching, no lesions. ENDOCRINE: No polyuria, polydipsia, no heat or cold intolerance. No recent change in weight. HEMATOLOGICAL: No anemia or easy bruising or bleeding. NEUROLOGIC: No headache, seizures, numbness, tingling or weakness. PSYCHIATRIC: No depression, no loss of interest  in normal activity or change in sleep pattern.     Exam:   BP 130/82   Ht 5\' 5"  (1.651 m)   Wt 103 lb (46.7 kg)   LMP 07/25/2011   BMI 17.14 kg/m   Body mass index is 17.14 kg/m.  General appearance : Well developed well nourished female. No acute distress HEENT: Eyes: no retinal hemorrhage or exudates,  Neck supple, trachea midline, no carotid bruits, no thyroidmegaly Lungs: Clear to auscultation, no rhonchi or wheezes, or rib retractions  Heart: Regular rate and rhythm, no murmurs or gallops Breast:Examined in sitting and supine position were symmetrical in appearance, no palpable masses or tenderness,  no skin retraction, no nipple inversion, no nipple discharge, no skin discoloration, no axillary or supraclavicular lymphadenopathy Abdomen: no palpable masses or tenderness, no rebound or guarding Extremities: no edema or skin discoloration or tenderness  Pelvic: Vulva: Normal             Vagina: No gross lesions or discharge  Cervix: No gross lesions or discharge.  Pap reflex done.  Uterus  AV, normal size, shape and consistency, non-tender and mobile  Adnexa  Without masses or tenderness  Anus: Normal   Assessment/Plan:  59 y.o. female for annual exam   1. Encounter for routine gynecological examination with Papanicolaou smear of cervix Normal gynecologic exam.  Pap reflex done.  Breast exam normal.  Screening mammogram annually.  Colonoscopy 2020.  Body mass index 17.14.  Encouraged to increase calories.  Continue with fitness.  Health labs with Dr. Regis Bill.  2. Postmenopausal Well on no hormone replacement therapy.  No postmenopausal bleeding.  Bone density normal in 2016.  Vitamin D supplements, calcium intake of 1500 mg daily and regular weightbearing physical activity is recommended.  Princess Bruins MD, 9:18 AM 07/08/2020

## 2020-07-09 LAB — PAP IG W/ RFLX HPV ASCU

## 2020-07-18 ENCOUNTER — Other Ambulatory Visit: Payer: Self-pay | Admitting: Internal Medicine

## 2020-07-19 ENCOUNTER — Encounter: Payer: Self-pay | Admitting: Obstetrics & Gynecology

## 2020-07-19 MED ORDER — CITALOPRAM HYDROBROMIDE 20 MG PO TABS: 30.0000 mg | ORAL_TABLET | Freq: Every day | ORAL | 3 refills | Status: DC

## 2020-08-12 ENCOUNTER — Other Ambulatory Visit: Payer: Self-pay

## 2020-08-12 ENCOUNTER — Ambulatory Visit
Admission: RE | Admit: 2020-08-12 | Discharge: 2020-08-12 | Disposition: A | Discharge status: Home / Self Care | Payer: 59 | Source: Ambulatory Visit | Attending: Internal Medicine | Admitting: Internal Medicine

## 2020-08-12 DIAGNOSIS — Z1231 Encounter for screening mammogram for malignant neoplasm of breast: Secondary | ICD-10-CM

## 2020-08-15 ENCOUNTER — Other Ambulatory Visit: Payer: Self-pay | Admitting: Internal Medicine

## 2020-08-17 NOTE — Telephone Encounter (Addendum)
We could increase gabapentin  to 500 mg at night  But need to know/ document  Reasoning for increasing dose .to give best advice  Contact patient and get more information

## 2020-08-20 ENCOUNTER — Other Ambulatory Visit: Payer: Self-pay | Admitting: Internal Medicine

## 2020-11-16 ENCOUNTER — Other Ambulatory Visit: Payer: Self-pay | Admitting: Internal Medicine

## 2020-12-08 ENCOUNTER — Other Ambulatory Visit: Payer: Self-pay | Admitting: Internal Medicine

## 2020-12-10 ENCOUNTER — Other Ambulatory Visit: Payer: Self-pay | Admitting: Internal Medicine

## 2020-12-11 ENCOUNTER — Other Ambulatory Visit: Payer: Self-pay | Admitting: Internal Medicine

## 2021-01-08 ENCOUNTER — Other Ambulatory Visit: Payer: Self-pay | Admitting: Internal Medicine

## 2021-01-28 ENCOUNTER — Telehealth: Payer: Self-pay | Admitting: Cardiology

## 2021-01-28 ENCOUNTER — Encounter (HOSPITAL_COMMUNITY): Payer: Self-pay | Admitting: Anesthesiology

## 2021-01-28 NOTE — Telephone Encounter (Signed)
Spoke with pt, she reports that her surgery was cancelled and they felt like she needed to be seen. Follow up scheduled

## 2021-01-28 NOTE — Progress Notes (Signed)
Spoke with patient  at 1530 pm and made her aware dr Mardelle Matte office was made aware pt needs cardiac clearance prior to 02-01-2021 surgery

## 2021-01-28 NOTE — Telephone Encounter (Signed)
Pt states she had a surgery scheduled for Tuesday 02/01/21 and she received a call today from Maish Vaya at [facility] surgical center stating that the surgery was cancelled and pt is not sure why.   Pt states she is not able to walk and she needs the surgery. Please advise pt further

## 2021-01-28 NOTE — Telephone Encounter (Signed)
   Name: Jessica Chan  DOB: 1960/10/25  MRN: [id number]  Primary Cardiologist: None  Chart reviewed as part of pre-operative protocol coverage. Because of Jessica Chan's past medical history and time since last visit, she will require a follow-up visit in order to better assess preoperative cardiovascular risk.  Unfortunately we cannot provide remote cardiac clearance on this patient because she has not been seen in over 1 year. Per protocol, needs appointment  Pre-op covering staff: - Please schedule appointment and call patient to inform them.  - Please contact requesting surgeon's office via preferred method (i.e, phone, fax) to inform them of need for appointment prior to surgery. If they truly feel patient needs cardiac clearance prior to surgery, the surgery date will likely need to be postponed unless DOD has spot on Monday.  No blood thinners listed on MAR.  Charlie Pitter, PA-C  01/28/2021, 4:42 PM

## 2021-01-28 NOTE — Telephone Encounter (Signed)
   [facility] Pre-operative Risk Assessment    Patient Name: Jessica Chan  DOB: 1961-03-15 MRN: [id number]  HEARTCARE STAFF:  - IMPORTANT!!!!!! Under Visit Info/Reason for Call, type in Other and utilize the format Clearance MM/DD/YY or Clearance TBD. Do not use dashes or single digits. - Please review there is not already an duplicate clearance open for this procedure. - If request is for dental extraction, please clarify the # of teeth to be extracted. - If the patient is currently at the dentist's office, call Pre-Op Callback Staff (MA/nurse) to input urgent request.  - If the patient is not currently in the dentist office, please route to the Pre-Op pool.  Request for surgical clearance:  What type of surgery is being performed? R Knee Surgery (Medial and Lateral Menisectomy) to repair bucket handel tear and a locked knee    When is this surgery scheduled? 02/01/21  What type of clearance is required (medical clearance vs. Pharmacy clearance to hold med vs. Both)? Cardiac Clearance  Are there any medications that need to be held prior to surgery and how long? none  Practice name and name of physician performing surgery? Dr. Marchia Bond, [place]   What is the office phone number? [phone]   7.   What is the office fax number? [phone] Attn: Claiborne Billings   8.   Anesthesia type (None, local, MAC, general) ? Nerve Block   Johnna Acosta 01/28/2021, 4:29 PM  _________________________________________________________________   (provider comments below)

## 2021-01-28 NOTE — Progress Notes (Signed)
Spoke with Dr Hulan Fray mda and patient needs cardiac clearance for 02-01-2021 surgery, called sherrie gavin at dr landau office and left voice mail message pt needs cardiac clearance prior to 02-01-2021 surgery per anesthesia dr Hulan Fray mda.

## 2021-01-28 NOTE — Telephone Encounter (Signed)
Pt has appt with Dr. Audie Box 7/12 for pre op. I will forward clearance notes to MD for upcoming appt. Will send FYI to surgeon's office pt has appt 7/12

## 2021-01-28 NOTE — Telephone Encounter (Signed)
Pt is returning call from earlier today about her surgery clearance. Please advise pt further

## 2021-01-30 NOTE — Progress Notes (Signed)
Cardiology Office Note:   Date:  02/01/2021  NAME:  Jessica Chan    MRN: [id number] DOB:  1961/07/23   PCP:  Burnis Medin, MD  Cardiologist:  None  Electrophysiologist:  None   Referring MD: Marchia Bond, MD   Chief Complaint  Patient presents with   Pre-op Exam   History of Present Illness:   Jessica Chan is a 60 y.o. female with a hx of anxiety who is being seen today for the evaluation of preoperative assessment at the request of Panosh, Standley Brooking, MD. Seen by Cardiology 11/06/2019 for CP associated with anxiety. Concerns for vasospastic angina. Started on diltiazem and CCTA ordered. This was not completed. Plans for knee arthroscopy.  She reports she saw cardiology in April 2021.  She apparently was under a lot of stress.  Her son was living in [place] and there was a lot of stress in her life.  She reports that she had intense chest pain when thinking of her son.  She has had no further episodes as her son is moved back home.  Her social situation has improved.  Anxiety has improved.  She is had no further chest pain episodes.  She did not take the diltiazem.  She has taken no nitroglycerin.  She reports she is highly active.  She is exercising.  She can run one 10K without any chest pain or trouble breathing.  She reports her knee troubles have limited her activity but she is without any major symptoms of chest pain or shortness of breath.  She is a never smoker.  She does not drink alcohol or use drugs.  She has 2 children.  She has 1 son with autism who she helps with.  She does yard work without limitations.  There is family history of stroke.  She really is very healthy.  Her EKG shows sinus rhythm with no acute ischemic changes or evidence of infarction.  Past Medical History: Past Medical History:  Diagnosis Date   Allergic rhinitis    Allergy    Anxiety    Anxiety disorder    BLEPHARITIS 05/27/2007   Qualifier: Diagnosis of  By: Hulan Saas, CMA (AAMA), Larene Beach S     Depression    Pt. denies   Diverticulitis    Diverticulitis of colon - recurrent 10/02/2013   Diverticulosis    ESOPHAGEAL STRICTURE 11/15/2009   Qualifier: Diagnosis of  By: Trellis Paganini PA-c, Amy S    GERD (gastroesophageal reflux disease)    Hemangioma of liver    incidental finding-large, right and left lobes of liver    Hiatal hernia    IBS (irritable bowel syndrome)    PONV (postoperative nausea and vomiting)    SEVERE NAUSEA & VOMITING   Sinusitis, acute maxillary 07/29/2012   right  hx of sinus surgery  fu if needed recurrecne      Past Surgical History: Past Surgical History:  Procedure Laterality Date   adnoidectomy     COLONOSCOPY     DILATATION & CURETTAGE/HYSTEROSCOPY WITH MYOSURE N/A 05/23/2016   Procedure: DILATATION & CURETTAGE/HYSTEROSCOPY WITH MYOSURE;  Surgeon: Terrance Mass, MD;  Location: [facility];  Service: Gynecology;  Laterality: N/A;  Request to post this for 12:30pm. That will leave room for a second major case for [place]. If we do not get a 2nd case in the next week I will move it up to follow. Thanks!  Needs one hour.  REQUEST FOR TINA RN AND AUDREY CST   endoscopy  with dialation     NASAL SINUS SURGERY     PROCTOSCOPY N/A 07/02/2014   Procedure: PROCTOSCOPY;  Surgeon: Michael Boston, MD;  Location: [facility];  Service: General;  Laterality: N/A;   ROBOT ASSISTED LAPAROSCOPIC PARTIAL COLECTOMY     TONSILLECTOMY     ulnar nerve right arm     UPPER GASTROINTESTINAL ENDOSCOPY      Current Medications: Current Meds  Medication Sig   BIOTIN PO Take 1 capsule by mouth daily.   citalopram (CELEXA) 20 MG tablet TAKE 1 AND 1/2 TABLETS(30 MG) BY MOUTH DAILY   gabapentin (NEURONTIN) 100 MG capsule TAKE 1 CAPSULE BY MOUTH AT BEDTIME. INCREASE TO 4 CAPSULES AT BEDTIME AS TOLERATED   Melatonin 3 MG CAPS Take by mouth. Takes one 3 mg chewy daily   Multiple Vitamin (MULTIVITAMIN) tablet Take 1 tablet by mouth every morning.    Omega-3 Fatty Acids (FISH OIL PO) Take 1  capsule by mouth daily.   tretinoin (RETIN-A) 0.025 % cream apply to affected area at bedtime TO DRY SKIN FOR ACNE   zolpidem (AMBIEN) 10 MG tablet TAKE 1 TABLET BY MOUTH AT BEDTIME AS NEEDED FOR SLEEP     Allergies:    Shellfish allergy, Demerol [meperidine], Amoxicillin-pot clavulanate, Meperidine hcl, and Oxycodone   Social History: Social History   Socioeconomic History   Marital status: Married    Spouse name: Not on file   Number of children: 2   Years of education: Not on file   Highest education level: Not on file  Occupational History   Occupation: Physiological scientist: UNEMPLOYED  Tobacco Use   Smoking status: Never   Smokeless tobacco: Never  Vaping Use   Vaping Use: Never used  Substance and Sexual Activity   Alcohol use: Yes    Alcohol/week: 0.0 standard drinks    Comment: Social Drinker--SEVERAL DRINKS A WEEK   Drug use: No   Sexual activity: Yes    Partners: Male    Comment: 1st intercourse- 89, partners- 93 married- 52 yrs  Other Topics Concern   Not on file  Social History Narrative   Married HHof 4 one sone autistic   Husband had prostate cancer in last few years   Some exercise  Runs    No tobacco   Social Determinants of Health   Financial Resource Strain: Not on file  Food Insecurity: Not on file  Transportation Needs: Not on file  Physical Activity: Not on file  Stress: Not on file  Social Connections: Not on file     Family History: The patient's family history includes Autism in her son; Breast cancer in her maternal grandmother; Colon cancer in her maternal uncle; Heart disease in her father; Lung cancer in her father and mother. There is no history of Esophageal cancer, Rectal cancer, or Stomach cancer.  ROS:   All other ROS reviewed and negative. Pertinent positives noted in the HPI.     EKGs/Labs/Other Studies Reviewed:   The following studies were personally reviewed by me today:  EKG:  EKG is ordered today.  The ekg ordered  today demonstrates normal sinus rhythm heart rate 79, no acute ischemic changes, no evidence of infarction, and was personally reviewed by me.   Recent Labs: No results found for requested labs within last 8760 hours.   Recent Lipid Panel    Component Value Date/Time   CHOL 186 11/03/2019 1014   TRIG 71.0 11/03/2019 1014   HDL 66.30 11/03/2019 1014  CHOLHDL 3 11/03/2019 1014   VLDL 14.2 11/03/2019 1014   LDLCALC 106 (H) 11/03/2019 1014    Physical Exam:   VS:  BP 136/84   Pulse 79   Ht 5\' 5"  (1.651 m)   Wt 107 lb (48.5 kg)   LMP 07/25/2011   SpO2 99%   BMI 17.81 kg/m    Wt Readings from Last 3 Encounters:  02/01/21 107 lb (48.5 kg)  07/08/20 103 lb (46.7 kg)  11/06/19 107 lb (48.5 kg)    General: Well nourished, well developed, in no acute distress Head: Atraumatic, normal size  Eyes: PEERLA, EOMI  Neck: Supple, no JVD Endocrine: No thryomegaly Cardiac: Normal S1, S2; RRR; no murmurs, rubs, or gallops Lungs: Clear to auscultation bilaterally, no wheezing, rhonchi or rales  Abd: Soft, nontender, no hepatomegaly  Ext: No edema, pulses 2+ Musculoskeletal: No deformities, BUE and BLE strength normal and equal Skin: Warm and dry, no rashes   Neuro: Alert and oriented to person, place, time, and situation, CNII-XII grossly intact, no focal deficits  Psych: Normal mood and affect   ASSESSMENT:   Jessica Chan is a 60 y.o. female who presents for the following: 1. Precordial pain   2. Preoperative cardiovascular examination     PLAN:   1. Precordial pain -She had chest pain in setting of significant stress last year.  Symptoms have resolved with improvement in her anxiety.  A coronary CTA was recommended but I see this is unnecessary.  She can complete a 10K without any limitations.  It is safe to say that her chest pain was noncardiac.  I have recommended against diltiazem and nitroglycerin moving forward.  She is had no further recurrence of symptoms.  I recommended  good stress reductive strategies.  2. Preoperative cardiovascular examination -RCRI is 0 which equates to 0.4% risk of major perioperative cardiovascular complication.  She has no symptoms.  She can complete greater than 4 METS without any limitations.  I recommended no further cardiac testing.  Her EKG shows normal sinus rhythm.  Cardiovascular examination is normal.  We will see her as needed.  Disposition: Return if symptoms worsen or fail to improve.  Medication Adjustments/Labs and Tests Ordered: Current medicines are reviewed at length with the patient today.  Concerns regarding medicines are outlined above.  Orders Placed This Encounter  Procedures   EKG 12-Lead   No orders of the defined types were placed in this encounter.   Patient Instructions  Medication Instructions:  The current medical regimen is effective;  continue present plan and medications.   *If you need a refill on your cardiac medications before your next appointment, please call your pharmacy*  Follow-Up: At [facility], you and your health needs are our priority.  As part of our continuing mission to provide you with exceptional heart care, we have created designated Provider Care Teams.  These Care Teams include your primary Cardiologist (physician) and Advanced Practice Providers (APPs -  Physician Assistants and Nurse Practitioners) who all work together to provide you with the care you need, when you need it.  We recommend signing up for the patient portal called "MyChart".  Sign up information is provided on this After Visit Summary.  MyChart is used to connect with patients for Virtual Visits (Telemedicine).  Patients are able to view lab/test results, encounter notes, upcoming appointments, etc.  Non-urgent messages can be sent to your provider as well.   To learn more about what you can do with MyChart,  go to [email or web address].    Your next appointment:   As needed  The format for your next  appointment:   In Person  Provider:   Eleonore Chiquito, MD  [address], [place]. Audie Box, MD, [place]  [address], [place], [place] [address] [phone]  02/01/2021 5:01 PM

## 2021-01-31 NOTE — Telephone Encounter (Signed)
Unfortunately we could not get the pt in any sooner. It is not our intention to delay surgery for our pt's though we did not receive clearance request until Friday 01/28/21.

## 2021-02-01 ENCOUNTER — Encounter (HOSPITAL_BASED_OUTPATIENT_CLINIC_OR_DEPARTMENT_OTHER): Admission: RE | Payer: Self-pay | Source: Home / Self Care

## 2021-02-01 ENCOUNTER — Other Ambulatory Visit: Payer: Self-pay

## 2021-02-01 ENCOUNTER — Encounter: Payer: Self-pay | Admitting: Cardiovascular Disease

## 2021-02-01 ENCOUNTER — Ambulatory Visit (HOSPITAL_BASED_OUTPATIENT_CLINIC_OR_DEPARTMENT_OTHER): Admission: RE | Admit: 2021-02-01 | Payer: 59 | Source: Home / Self Care | Admitting: Orthopedic Surgery

## 2021-02-01 ENCOUNTER — Ambulatory Visit (INDEPENDENT_AMBULATORY_CARE_PROVIDER_SITE_OTHER): Payer: 59 | Admitting: Cardiovascular Disease

## 2021-02-01 VITALS — BP 136/84 | HR 79 | Ht 65.0 in | Wt 107.0 lb

## 2021-02-01 DIAGNOSIS — Z01810 Encounter for preprocedural cardiovascular examination: Secondary | ICD-10-CM

## 2021-02-01 DIAGNOSIS — R072 Precordial pain: Secondary | ICD-10-CM

## 2021-02-01 SURGERY — ARTHROSCOPY, KNEE, WITH MEDIAL MENISCECTOMY
Anesthesia: Choice | Site: Knee | Laterality: Right

## 2021-02-01 NOTE — Patient Instructions (Signed)
Medication Instructions:  The current medical regimen is effective;  continue present plan and medications.  *If you need a refill on your cardiac medications before your next appointment, please call your pharmacy*    Follow-Up: At [facility], you and your health needs are our priority.  As part of our continuing mission to provide you with exceptional heart care, we have created designated Provider Care Teams.  These Care Teams include your primary Cardiologist (physician) and Advanced Practice Providers (APPs -  Physician Assistants and Nurse Practitioners) who all work together to provide you with the care you need, when you need it.  We recommend signing up for the patient portal called "MyChart".  Sign up information is provided on this After Visit Summary.  MyChart is used to connect with patients for Virtual Visits (Telemedicine).  Patients are able to view lab/test results, encounter notes, upcoming appointments, etc.  Non-urgent messages can be sent to your provider as well.   To learn more about what you can do with MyChart, go to [email or web address].    Your next appointment:   As needed  The format for your next appointment:   In Person  Provider:   [place] O'Neal, MD      

## 2021-02-02 ENCOUNTER — Other Ambulatory Visit: Payer: Self-pay

## 2021-02-02 ENCOUNTER — Other Ambulatory Visit: Payer: Self-pay | Admitting: Orthopedic Surgery

## 2021-02-02 ENCOUNTER — Encounter (HOSPITAL_BASED_OUTPATIENT_CLINIC_OR_DEPARTMENT_OTHER): Payer: Self-pay | Admitting: Orthopedic Surgery

## 2021-02-02 DIAGNOSIS — Z973 Presence of spectacles and contact lenses: Secondary | ICD-10-CM

## 2021-02-02 HISTORY — DX: Presence of spectacles and contact lenses: Z97.3

## 2021-02-02 NOTE — Progress Notes (Addendum)
Spoke w/ via phone for pre-op interview---pt Lab needs dos----    none           Lab results------ekg 02-01-2021 epic COVID test -----patient states asymptomatic no test needed Arrive at -------1030 am 02-08-2021 NPO after MN NO Solid Food.  Clear liquids from MN until---930 am then npo Med rec completed Medications to take morning of surgery -----citalopram Diabetic medication -----n/a Patient instructed no nail polish to be worn day of surgery Patient instructed to bring photo id and insurance card day of surgery Patient aware to have Driver (ride ) / caregiver   nicholas spouse cell [phone]  for 24 hours after surgery  Patient Special Instructions -----none Pre-Op special Istructions -----none Patient verbalized understanding of instructions that were given at this phone interview. Patient denies shortness of breath, chest pain, fever, cough at this phone interview.   Cardiac clearance note dr Rogers Seeds 02-01-2021 chart/ epic

## 2021-02-04 ENCOUNTER — Telehealth: Payer: Self-pay | Admitting: Internal Medicine

## 2021-02-04 NOTE — Telephone Encounter (Signed)
Pt is calling in needing to r/s her CPE due to her having surgery on 02/08/2021 and she is trying to r/s her and her son on the same day due to the son does not drive.  Pt was wanting to see if they could be r/s'd on 03/14/2021 Monday in the afternoon back to back for their CPE.  Pt would like to have a call back.

## 2021-02-04 NOTE — Telephone Encounter (Signed)
Ok to schedule for Date noted  back to back

## 2021-02-04 NOTE — Telephone Encounter (Signed)
Okay to schedule back to back with her son?

## 2021-02-04 NOTE — Telephone Encounter (Signed)
Pt was called and rescheduled with the approval of Dr. Regis Bill.

## 2021-02-07 ENCOUNTER — Other Ambulatory Visit: Payer: Self-pay | Admitting: Internal Medicine

## 2021-02-07 NOTE — H&P (Signed)
PREOPERATIVE H&P  Chief Complaint: right knee pain  HPI: Jessica Chan is a 60 y.o. female who presents for with moderate to severe right knee pain ongoing for the last year, but significantly worsening over the last month. Pain is mainly located at the medial knee.She gets locking and catching which has gotten worse in the last few weeks. MRI was performed showing a longitudinal meniscus tear.   This is significantly impairing activities of daily living.  She has elected for surgical management.   Past Medical History:  Diagnosis Date   Allergic rhinitis    Allergy    Anxiety    Anxiety disorder    BLEPHARITIS 05/27/2007   Qualifier: Diagnosis of  By: Hulan Saas, CMA (AAMA), Larene Beach S    Depression    Pt. denies   Diverticulitis    Diverticulitis of colon - recurrent 10/02/2013   Diverticulosis    ESOPHAGEAL STRICTURE 11/15/2009   Qualifier: Diagnosis of  By: Trellis Paganini PA-c, Amy S    GERD (gastroesophageal reflux disease)    Hemangioma of liver    incidental finding-large, right and left lobes of liver    Hiatal hernia    IBS (irritable bowel syndrome)    PONV (postoperative nausea and vomiting)    SEVERE NAUSEA & VOMITING   Sinusitis, acute maxillary 07/29/2012   right  hx of sinus surgery  fu if needed recurrecne     Wears glasses 02/02/2021   Past Surgical History:  Procedure Laterality Date   adnoidectomy     COLONOSCOPY     DILATATION & CURETTAGE/HYSTEROSCOPY WITH MYOSURE N/A 05/23/2016   Procedure: DILATATION & CURETTAGE/HYSTEROSCOPY WITH MYOSURE;  Surgeon: Terrance Mass, MD;  Location: [facility];  Service: Gynecology;  Laterality: N/A;  Request to post this for 12:30pm. That will leave room for a second major case for [place]. If we do not get a 2nd case in the next week I will move it up to follow. Thanks!  Needs one hour.  REQUEST FOR TINA RN AND AUDREY CST   endoscopy with dialation     NASAL SINUS SURGERY     PROCTOSCOPY N/A 07/02/2014   Procedure: PROCTOSCOPY;   Surgeon: Michael Boston, MD;  Location: [facility];  Service: General;  Laterality: N/A;   ROBOT ASSISTED LAPAROSCOPIC PARTIAL COLECTOMY     TONSILLECTOMY     ulnar nerve right arm     UPPER GASTROINTESTINAL ENDOSCOPY     Social History   Socioeconomic History   Marital status: Married    Spouse name: Not on file   Number of children: 2   Years of education: Not on file   Highest education level: Not on file  Occupational History   Occupation: Physiological scientist: UNEMPLOYED  Tobacco Use   Smoking status: Never   Smokeless tobacco: Never  Vaping Use   Vaping Use: Never used  Substance and Sexual Activity   Alcohol use: Yes    Alcohol/week: 0.0 standard drinks    Comment: Social Drinker--SEVERAL DRINKS A WEEK   Drug use: No   Sexual activity: Yes    Partners: Male    Comment: 1st intercourse- 72, partners- 82 married- 62 yrs  Other Topics Concern   Not on file  Social History Narrative   Married HHof 4 one sone autistic   Husband had prostate cancer in last few years   Some exercise  Runs    No tobacco   Social Determinants of Health   Financial Resource Strain: Not  on file  Food Insecurity: Not on file  Transportation Needs: Not on file  Physical Activity: Not on file  Stress: Not on file  Social Connections: Not on file   Family History  Problem Relation Age of Onset   Lung cancer Mother    Autism Son    Colon cancer Maternal Uncle    Heart disease Father    Lung cancer Father    Breast cancer Maternal Grandmother    Esophageal cancer Neg Hx    Rectal cancer Neg Hx    Stomach cancer Neg Hx    Allergies  Allergen Reactions   Shellfish Allergy Hives, Swelling and Other (See Comments)    Swelling lips and numbness in lips and nose and itching throat after eating shrimp. and also sensitive touching shrimp with burning sensation and pain   Demerol [Meperidine] Hives and Rash   Amoxicillin-Pot Clavulanate Nausea Only    Has patient had a PCN reaction causing  immediate rash, facial/tongue/throat swelling, SOB or lightheadedness with hypotension: no Has patient had a PCN reaction causing severe rash involving mucus membranes or skin necrosis: no Has patient had a PCN reaction that required hospitalization no Has patient had a PCN reaction occurring within the last 10 years: maybe? If all of the above answers are "NO", then may proceed with Cephalosporin use.    Meperidine Hcl     Red patches all over body   Oxycodone Nausea Only   Prior to Admission medications   Medication Sig Start Date End Date Taking? Authorizing Provider  BIOTIN PO Take 1 capsule by mouth daily.    [provider]  citalopram (CELEXA) 20 MG tablet TAKE 1 AND 1/2 TABLETS(30 MG) BY MOUTH DAILY 02/07/21   Panosh, Standley Brooking, MD  gabapentin (NEURONTIN) 100 MG capsule TAKE 1 CAPSULE BY MOUTH AT BEDTIME. MAY INCREASE TO 4 CAPSULES AT BEDTIME AS TOLERATED 02/07/21   Panosh, Standley Brooking, MD  Melatonin 3 MG CAPS Take by mouth. Takes one 3 mg chewy daily    [provider]  Multiple Vitamin (MULTIVITAMIN) tablet Take 1 tablet by mouth every morning.     [provider]  Omega-3 Fatty Acids (FISH OIL PO) Take 1 capsule by mouth daily.    [provider]  tretinoin (RETIN-A) 0.025 % cream apply to affected area at bedtime TO DRY SKIN FOR ACNE 03/30/15   [provider]  zolpidem (AMBIEN) 10 MG tablet TAKE 1 TABLET BY MOUTH AT BEDTIME AS NEEDED FOR SLEEP 05/07/20   Princess Bruins, MD     Positive ROS: All other systems have been reviewed and were otherwise negative with the exception of those mentioned in the HPI and as above.  Physical Exam: General: Alert, no acute distress Cardiovascular: No pedal edema Respiratory: No cyanosis, no use of accessory musculature GI: No organomegaly, abdomen is soft and non-tender Skin: No lesions in the area of chief complaint Neurologic: Sensation intact distally Psychiatric: Patient is competent for consent  with normal mood and affect Lymphatic: No axillary or cervical lymphadenopathy  MUSCULOSKELETAL: No effusion. +MJLT. 5-110 deg ROM. Stable to ligamentous testing.  Assessment: Right knee medial meniscus tear   Plan: Plan for Procedure(s): KNEE ARTHROSCOPY WITH MEDIAL AND LATERAL MENISECTOMIES  The risks benefits and alternatives were discussed with the patient including but not limited to the risks of nonoperative treatment, versus surgical intervention including infection, bleeding, nerve injury,  blood clots, cardiopulmonary complications, morbidity, mortality, among others, and they were willing to proceed.  Patient's anticipated LOS is less than 2 midnights, meeting these requirements: - Younger than 50 - Lives within 1 hour of care - Has a competent adult at home to recover with post-op recover - NO history of  - Chronic pain requiring opiods  - Diabetes  - Coronary Artery Disease  - Heart failure  - Heart attack  - Stroke  - DVT/VTE  - Cardiac arrhythmia  - Respiratory Failure/COPD  - Renal failure  - Anemia  - Advanced Liver disease      Ventura Bruns, PA-C    02/07/2021 5:35 PM

## 2021-02-08 ENCOUNTER — Encounter (HOSPITAL_BASED_OUTPATIENT_CLINIC_OR_DEPARTMENT_OTHER)
Admission: RE | Disposition: A | Discharge status: Home / Self Care | Payer: Self-pay | Source: Home / Self Care | Attending: Orthopedic Surgery

## 2021-02-08 ENCOUNTER — Encounter (HOSPITAL_BASED_OUTPATIENT_CLINIC_OR_DEPARTMENT_OTHER): Payer: Self-pay | Admitting: Orthopedic Surgery

## 2021-02-08 ENCOUNTER — Other Ambulatory Visit: Payer: Self-pay

## 2021-02-08 ENCOUNTER — Ambulatory Visit (HOSPITAL_BASED_OUTPATIENT_CLINIC_OR_DEPARTMENT_OTHER): Payer: 59 | Admitting: Anesthesiology

## 2021-02-08 ENCOUNTER — Ambulatory Visit (HOSPITAL_BASED_OUTPATIENT_CLINIC_OR_DEPARTMENT_OTHER)
Admission: RE | Admit: 2021-02-08 | Discharge: 2021-02-08 | Disposition: A | Discharge status: Home / Self Care | Payer: 59 | Attending: Orthopedic Surgery | Admitting: Orthopedic Surgery

## 2021-02-08 DIAGNOSIS — Z9049 Acquired absence of other specified parts of digestive tract: Secondary | ICD-10-CM | POA: Insufficient documentation

## 2021-02-08 DIAGNOSIS — X58XXXA Exposure to other specified factors, initial encounter: Secondary | ICD-10-CM | POA: Insufficient documentation

## 2021-02-08 DIAGNOSIS — Z79899 Other long term (current) drug therapy: Secondary | ICD-10-CM | POA: Insufficient documentation

## 2021-02-08 DIAGNOSIS — Z560 Unemployment, unspecified: Secondary | ICD-10-CM | POA: Insufficient documentation

## 2021-02-08 DIAGNOSIS — Z885 Allergy status to narcotic agent status: Secondary | ICD-10-CM | POA: Insufficient documentation

## 2021-02-08 DIAGNOSIS — Z880 Allergy status to penicillin: Secondary | ICD-10-CM | POA: Diagnosis not present

## 2021-02-08 DIAGNOSIS — Z9889 Other specified postprocedural states: Secondary | ICD-10-CM

## 2021-02-08 DIAGNOSIS — S83241A Other tear of medial meniscus, current injury, right knee, initial encounter: Secondary | ICD-10-CM | POA: Insufficient documentation

## 2021-02-08 HISTORY — PX: KNEE ARTHROSCOPY WITH MEDIAL MENISECTOMY: SHX5651

## 2021-02-08 SURGERY — ARTHROSCOPY, KNEE, WITH MEDIAL MENISCECTOMY
Anesthesia: General | Site: Knee | Laterality: Right

## 2021-02-08 MED ORDER — SODIUM CHLORIDE 0.9 % IR SOLN
Status: DC | PRN
  Administered 2021-02-08: 3000 mL
  Administered 2021-02-08: 6000 mL

## 2021-02-08 MED ORDER — LIDOCAINE 2% (20 MG/ML) 5 ML SYRINGE
INTRAMUSCULAR | Status: DC | PRN
  Administered 2021-02-08: 100 mg via INTRAVENOUS

## 2021-02-08 MED ORDER — CELECOXIB 200 MG PO CAPS
200.0000 mg | ORAL_CAPSULE | Freq: Once | ORAL | Status: AC
  Administered 2021-02-08: 200 mg via ORAL

## 2021-02-08 MED ORDER — ACETAMINOPHEN 500 MG PO TABS
1000.0000 mg | ORAL_TABLET | Freq: Once | ORAL | Status: AC
  Administered 2021-02-08: 1000 mg via ORAL

## 2021-02-08 MED ORDER — PROPOFOL 500 MG/50ML IV EMUL
INTRAVENOUS | Status: AC
  Filled 2021-02-08: qty 100

## 2021-02-08 MED ORDER — PROPOFOL 500 MG/50ML IV EMUL
INTRAVENOUS | Status: DC | PRN
  Administered 2021-02-08: 200 ug/kg/min via INTRAVENOUS

## 2021-02-08 MED ORDER — CEFAZOLIN SODIUM-DEXTROSE 2-3 GM-%(50ML) IV SOLR
INTRAVENOUS | Status: DC | PRN
  Administered 2021-02-08: 2 g via INTRAVENOUS

## 2021-02-08 MED ORDER — ONDANSETRON HCL 4 MG/2ML IJ SOLN
INTRAMUSCULAR | Status: DC | PRN
  Administered 2021-02-08: 4 mg via INTRAVENOUS

## 2021-02-08 MED ORDER — FENTANYL CITRATE (PF) 100 MCG/2ML IJ SOLN
INTRAMUSCULAR | Status: AC
  Filled 2021-02-08: qty 2

## 2021-02-08 MED ORDER — FENTANYL CITRATE (PF) 100 MCG/2ML IJ SOLN
25.0000 ug | INTRAMUSCULAR | Status: DC | PRN
  Administered 2021-02-08: 25 ug via INTRAVENOUS

## 2021-02-08 MED ORDER — SCOPOLAMINE 1 MG/3DAYS TD PT72
MEDICATED_PATCH | TRANSDERMAL | Status: AC
  Filled 2021-02-08: qty 1

## 2021-02-08 MED ORDER — HYDROCODONE-ACETAMINOPHEN 5-325 MG PO TABS: 1.0000 | ORAL_TABLET | Freq: Four times a day (QID) | ORAL | 0 refills | Status: DC | PRN

## 2021-02-08 MED ORDER — EPHEDRINE SULFATE-NACL 50-0.9 MG/10ML-% IV SOSY
PREFILLED_SYRINGE | INTRAVENOUS | Status: DC | PRN
  Administered 2021-02-08: 10 mg via INTRAVENOUS

## 2021-02-08 MED ORDER — CELECOXIB 200 MG PO CAPS
ORAL_CAPSULE | ORAL | Status: AC
  Filled 2021-02-08: qty 1

## 2021-02-08 MED ORDER — EPHEDRINE 5 MG/ML INJ
INTRAVENOUS | Status: AC
  Filled 2021-02-08: qty 10

## 2021-02-08 MED ORDER — LIDOCAINE HCL (PF) 2 % IJ SOLN
INTRAMUSCULAR | Status: AC
  Filled 2021-02-08: qty 10

## 2021-02-08 MED ORDER — CEFAZOLIN SODIUM 2 G IJ SOLR: 2.0000 g | INTRAMUSCULAR | Status: DC

## 2021-02-08 MED ORDER — KETAMINE HCL 50 MG/5ML IJ SOSY
PREFILLED_SYRINGE | INTRAMUSCULAR | Status: AC
  Filled 2021-02-08: qty 5

## 2021-02-08 MED ORDER — BUPIVACAINE HCL (PF) 0.5 % IJ SOLN
INTRAMUSCULAR | Status: DC | PRN
  Administered 2021-02-08: 17 mL

## 2021-02-08 MED ORDER — KETAMINE HCL 10 MG/ML IJ SOLN
INTRAMUSCULAR | Status: DC | PRN
  Administered 2021-02-08: 20 mg via INTRAVENOUS
  Administered 2021-02-08: 10 mg via INTRAVENOUS

## 2021-02-08 MED ORDER — SCOPOLAMINE 1 MG/3DAYS TD PT72
1.0000 | MEDICATED_PATCH | TRANSDERMAL | Status: DC
  Administered 2021-02-08: 1.5 mg via TRANSDERMAL

## 2021-02-08 MED ORDER — MIDAZOLAM HCL 2 MG/2ML IJ SOLN
INTRAMUSCULAR | Status: AC
  Filled 2021-02-08: qty 2

## 2021-02-08 MED ORDER — PROPOFOL 10 MG/ML IV BOLUS
INTRAVENOUS | Status: DC | PRN
  Administered 2021-02-08: 20 mg via INTRAVENOUS
  Administered 2021-02-08: 30 mg via INTRAVENOUS
  Administered 2021-02-08: 150 mg via INTRAVENOUS
  Administered 2021-02-08: 20 mg via INTRAVENOUS

## 2021-02-08 MED ORDER — PROMETHAZINE HCL 25 MG/ML IJ SOLN: 6.2500 mg | INTRAMUSCULAR | Status: DC | PRN

## 2021-02-08 MED ORDER — PROPOFOL 10 MG/ML IV BOLUS
INTRAVENOUS | Status: AC
  Filled 2021-02-08: qty 20

## 2021-02-08 MED ORDER — LACTATED RINGERS IV SOLN: INTRAVENOUS | Status: DC

## 2021-02-08 MED ORDER — DEXAMETHASONE SODIUM PHOSPHATE 10 MG/ML IJ SOLN
INTRAMUSCULAR | Status: DC | PRN
  Administered 2021-02-08: 10 mg via INTRAVENOUS

## 2021-02-08 MED ORDER — DEXAMETHASONE SODIUM PHOSPHATE 10 MG/ML IJ SOLN
INTRAMUSCULAR | Status: AC
  Filled 2021-02-08: qty 1

## 2021-02-08 MED ORDER — ACETAMINOPHEN 500 MG PO TABS
ORAL_TABLET | ORAL | Status: AC
  Filled 2021-02-08: qty 2

## 2021-02-08 MED ORDER — CEFAZOLIN SODIUM 1 G IJ SOLR
INTRAMUSCULAR | Status: AC
  Filled 2021-02-08: qty 20

## 2021-02-08 MED ORDER — ONDANSETRON HCL 4 MG/2ML IJ SOLN
INTRAMUSCULAR | Status: AC
  Filled 2021-02-08: qty 2

## 2021-02-08 SURGICAL SUPPLY — 32 items
BLADE CUTTER GATOR 3.5 (BLADE) ×3 IMPLANT
BNDG ELASTIC 4X5.8 VLCR STR LF (GAUZE/BANDAGES/DRESSINGS) ×2 IMPLANT
BNDG ELASTIC 6X5.8 VLCR STR LF (GAUZE/BANDAGES/DRESSINGS) ×3 IMPLANT
CLOSURE STERI-STRIP 1/2X4 (GAUZE/BANDAGES/DRESSINGS) ×1
CLOSURE WOUND 1/2 X4 (GAUZE/BANDAGES/DRESSINGS) ×1
CLSR STERI-STRIP ANTIMIC 1/2X4 (GAUZE/BANDAGES/DRESSINGS) ×2 IMPLANT
CUTTER TENSIONER SUT 2-0 0 FBW (INSTRUMENTS) IMPLANT
DRAPE ARTHROSCOPY W/POUCH 114 (DRAPES) ×3 IMPLANT
DRAPE IMP U-DRAPE 54X76 (DRAPES) ×3 IMPLANT
DRAPE U-SHAPE 47X51 STRL (DRAPES) ×5 IMPLANT
DURAPREP 26ML APPLICATOR (WOUND CARE) ×3 IMPLANT
GAUZE 4X4 16PLY ~~LOC~~+RFID DBL (SPONGE) ×3 IMPLANT
GAUZE SPONGE 4X4 12PLY STRL (GAUZE/BANDAGES/DRESSINGS) ×3 IMPLANT
GAUZE SPONGE 4X4 12PLY STRL LF (GAUZE/BANDAGES/DRESSINGS) ×2 IMPLANT
GLOVE SRG 8 PF TXTR STRL LF DI (GLOVE) ×2 IMPLANT
GLOVE SURG ENC MOIS LTX SZ8 (GLOVE) ×3 IMPLANT
GLOVE SURG ORTHO LTX SZ7.5 (GLOVE) ×3 IMPLANT
GLOVE SURG UNDER POLY LF SZ8 (GLOVE) ×6
GOWN STRL REUS W/TWL LRG LVL3 (GOWN DISPOSABLE) ×9 IMPLANT
IV NS IRRIG 3000ML ARTHROMATIC (IV SOLUTION) ×6 IMPLANT
KIT TURNOVER CYSTO (KITS) ×3 IMPLANT
MANIFOLD NEPTUNE II (INSTRUMENTS) ×3 IMPLANT
MARKER SKIN DUAL TIP RULER LAB (MISCELLANEOUS) ×4 IMPLANT
PACK ARTHROSCOPY DSU (CUSTOM PROCEDURE TRAY) ×3 IMPLANT
PACK BASIN DAY SURGERY FS (CUSTOM PROCEDURE TRAY) ×3 IMPLANT
SLEEVE SCD COMPRESS KNEE MED (STOCKING) IMPLANT
SOL ANTI FOG 6CC (MISCELLANEOUS) IMPLANT
SOLUTION ANTI FOG 6CC (MISCELLANEOUS) ×2
STRIP CLOSURE SKIN 1/2X4 (GAUZE/BANDAGES/DRESSINGS) ×1 IMPLANT
SUT MNCRL AB 4-0 PS2 18 (SUTURE) ×3 IMPLANT
TUBING ARTHROSCOPY IRRIG 16FT (MISCELLANEOUS) ×3 IMPLANT
WATER STERILE IRR 1000ML POUR (IV SOLUTION) ×3 IMPLANT

## 2021-02-08 NOTE — Anesthesia Procedure Notes (Signed)
Procedure Name: LMA Insertion Date/Time: 02/08/2021 1:40 PM Performed by: Rogers Blocker, CRNA Pre-anesthesia Checklist: Patient identified, Emergency Drugs available, Suction available and Patient being monitored Patient Re-evaluated:Patient Re-evaluated prior to induction Oxygen Delivery Method: Circle System Utilized Preoxygenation: Pre-oxygenation with 100% oxygen Induction Type: IV induction Ventilation: Mask ventilation without difficulty LMA: LMA inserted LMA Size: 4.0 Number of attempts: 1 Placement Confirmation: positive ETCO2 Tube secured with: Tape Dental Injury: Teeth and Oropharynx as per pre-operative assessment

## 2021-02-08 NOTE — Anesthesia Preprocedure Evaluation (Signed)
Anesthesia Evaluation  Patient identified by MRN, date of birth, ID band Patient awake    Reviewed: Allergy & Precautions, NPO status , Patient's Chart, lab work & pertinent test results  History of Anesthesia Complications (+) PONV and history of anesthetic complications  Airway Mallampati: I  TM Distance: >3 FB Neck ROM: Full    Dental  (+) Teeth Intact, Dental Advisory Given   Pulmonary neg pulmonary ROS,    Pulmonary exam normal breath sounds clear to auscultation       Cardiovascular negative cardio ROS Normal cardiovascular exam Rhythm:Regular Rate:Normal     Neuro/Psych PSYCHIATRIC DISORDERS Anxiety Depression negative neurological ROS     GI/Hepatic Neg liver ROS, hiatal hernia, GERD  ,Esophageal stricture H/o partial colectomy    Endo/Other  negative endocrine ROS  Renal/GU negative Renal ROS     Musculoskeletal RIGHT KNEE MEDIAL AND LATERAL MENISCUS TEARS   Abdominal   Peds  Hematology negative hematology ROS (+)   Anesthesia Other Findings Day of surgery medications reviewed with the patient.  Reproductive/Obstetrics                             Anesthesia Physical Anesthesia Plan  ASA: 2  Anesthesia Plan: General   Post-op Pain Management:    Induction: Intravenous  PONV Risk Score and Plan: 4 or greater and Midazolam, TIVA, Dexamethasone, Ondansetron and Scopolamine patch - Pre-op  Airway Management Planned: LMA  Additional Equipment:   Intra-op Plan:   Post-operative Plan: Extubation in OR  Informed Consent: I have reviewed the patients History and Physical, chart, labs and discussed the procedure including the risks, benefits and alternatives for the proposed anesthesia with the patient or authorized representative who has indicated his/her understanding and acceptance.     Dental advisory given  Plan Discussed with: CRNA  Anesthesia Plan Comments:          Anesthesia Quick Evaluation

## 2021-02-08 NOTE — Transfer of Care (Signed)
Immediate Anesthesia Transfer of Care Note  Patient: Jessica Chan  Procedure(s) Performed: KNEE ARTHROSCOPY WITH MEDIAL AND LATERAL MENISECTOMIES (Right: Knee)  Patient Location: PACU  Anesthesia Type:General  Level of Consciousness: drowsy, patient cooperative and responds to stimulation  Airway & Oxygen Therapy: Patient Spontanous Breathing and Patient connected to face mask oxygen  Post-op Assessment: Report given to RN and Post -op Vital signs reviewed and stable  Post vital signs: Reviewed and stable  Last Vitals:  Vitals Value Taken Time  BP 139/75 02/08/21 1554  Temp    Pulse 71 02/08/21 1558  Resp 16 02/08/21 1558  SpO2 100 % 02/08/21 1558  Vitals shown include unvalidated device data.  Last Pain:  Vitals:   02/08/21 1039  TempSrc: Oral  PainSc: 0-No pain      Patients Stated Pain Goal: 3 (15/52/08 0223)  Complications: No notable events documented.

## 2021-02-08 NOTE — Op Note (Signed)
02/08/2021  5:57 PM  PATIENT:  Jessica Chan    PRE-OPERATIVE DIAGNOSIS: Right knee medial peripheral longitudinal tear of the meniscus  POST-OPERATIVE DIAGNOSIS: Right knee peripheral medial bucket-handle meniscus tear  PROCEDURE: Right knee arthroscopy with partial medial meniscectomy  SURGEON:  [name], MD  PHYSICIAN ASSISTANT: Merlene Pulling, PA-C, present and scrubbed throughout the case, critical for completion in a timely fashion, and for retraction, instrumentation, and closure.  ANESTHESIA:   General  PREOPERATIVE INDICATIONS:  Jessica Chan is a  60 y.o. female who tore her right medial meniscus.  She then had another event where she developed more severe pain, which I believe was when she bucketed her meniscus tear.  The risks benefits and alternatives were discussed with the patient preoperatively including but not limited to the risks of infection, bleeding, nerve injury, cardiopulmonary complications, the need for revision surgery, among others, and the patient was willing to proceed.  We also discussed the risks for recurrent meniscal tear.  ESTIMATED BLOOD LOSS: Minimal  OPERATIVE IMPLANTS: None  OPERATIVE FINDINGS: Her ACL and PCL were intact.  Patellofemoral joint had intact cartilage.  The lateral compartment was intact.  Lateral meniscus was intact.  Medial meniscus had a bucket-handle tear that was incarcerated.  OPERATIVE PROCEDURE: The patient was brought to the operating room and placed in supine position.  General anesthesia was administered.  The right lower extremity was prepped and draped in usual sterile fashion.  Timeout was performed.  Diagnostic arthroscopy was carried out with the above named findings.  I used the arthroscopic basket to clip the anterior portion of the meniscus, and then shaved down the anterior rim of the remnant meniscus to a smooth configuration.  I then went to the posterior horn, and made a couple of small basket clips, used the  shaver, but then at this point we encountered substantial technical difficulties with the arthroscopic equipment, and I lost visualization, after switching the equipment out 3 times, I went looking for the meniscal fragment.  I could not find the meniscal fragment, and I am not 100% sure if it was brought into the meniscal shaver during the episodes of poor visualization.  I went into the posterior medial compartment, and evaluated that area, and there was no signs for meniscal fragment, I used a 70 degree scope, again, no signs for the fragment.  I went throughout the knee doing a diagnostic arthroscopy in all of the gutters, the suprapatellar pouch, as well as the anterior aspect of the knee, and both compartments, including the popliteal hiatus, cycled the knee multiple times, repeated diagnostic arthroscopy, and still did not find any remaining fragments.  After an exhaustive search, I was satisfied that certainly there was no incarcerated fragment any longer, and hopefully the meniscal fragment that I had clipped removed with the shaver.  The instruments were removed, the portals closed with Monocryl followed by Steri-Strips and sterile gauze.  She tolerated the procedure well, and there were no complications.

## 2021-02-08 NOTE — Discharge Instructions (Addendum)
Diet: As you were doing prior to hospitalization   Shower:  May shower but keep the wounds dry, use an occlusive plastic wrap, NO SOAKING IN TUB.  If the bandage gets wet, change with a clean dry gauze.  If you have a splint on, leave the splint in place and keep the splint dry with a plastic bag.  Dressing:  You may change your dressing 3-5 days after surgery, unless you have a splint.  If you have a splint, then just leave the splint in place and we will change your bandages during your first follow-up appointment.    If you had hand or foot surgery, we will plan to remove your stitches in about 2 weeks in the office.  For all other surgeries, there are sticky tapes (steri-strips) on your wounds and all the stitches are absorbable.  Leave the steri-strips in place when changing your dressings, they will peel off with time, usually 2-3 weeks.  Activity:  Increase activity slowly as tolerated, but follow the weight bearing instructions below.  The rules on driving is that you can not be taking narcotics while you drive, and you must feel in control of the vehicle.    Weight Bearing:   weight bearing as tolerated on right leg  To prevent constipation: you may use a stool softener such as -  Colace (over the counter) 100 mg by mouth twice a day  Drink plenty of fluids (prune juice may be helpful) and high fiber foods Miralax (over the counter) for constipation as needed.    Itching:  If you experience itching with your medications, try taking only a single pain pill, or even half a pain pill at a time.  You may take up to 10 pain pills per day, and you can also use benadryl over the counter for itching or also to help with sleep.   Precautions:  If you experience chest pain or shortness of breath - call 911 immediately for transfer to the hospital emergency department!!  If you develop a fever greater that 101 F, purulent drainage from wound, increased redness or drainage from wound, or calf pain  -- Call the office at [phone]                                                Follow- Up Appointment:  Please call for an appointment to be seen in 2 weeks [place] - [phone]     Post Anesthesia Home Care Instructions  Activity: Get plenty of rest for the remainder of the day. A responsible individual must stay with you for 24 hours following the procedure.  For the next 24 hours, DO NOT: -Drive a car -Paediatric nurse -Drink alcoholic beverages -Take any medication unless instructed by your physician -Make any legal decisions or sign important papers.  Meals: Start with liquid foods such as gelatin or soup. Progress to regular foods as tolerated. Avoid greasy, spicy, heavy foods. If nausea and/or vomiting occur, drink only clear liquids until the nausea and/or vomiting subsides. Call your physician if vomiting continues.  Special Instructions/Symptoms: Your throat may feel dry or sore from the anesthesia or the breathing tube placed in your throat during surgery. If this causes discomfort, gargle with warm salt water. The discomfort should disappear within 24 hours.  If you had a scopolamine patch placed behind your ear for  the management of post- operative nausea and/or vomiting:  1. The medication in the patch is effective for 72 hours, after which it should be removed.  Wrap patch in a tissue and discard in the trash. Wash hands thoroughly with soap and water. 2. You may remove the patch earlier than 72 hours if you experience unpleasant side effects which may include dry mouth, dizziness or visual disturbances. 3. Avoid touching the patch. Wash your hands with soap and water after contact with the patch.     No ibuprofen, Advil, Aleve, Motrin, ketorolac, meloxicam, or naproxen until after 5:15 pm today if needed. No Tylenol (acetaminophen) until after 5:15 pm today if needed.

## 2021-02-08 NOTE — Interval H&P Note (Signed)
History and Physical Interval Note:  02/08/2021 1:21 PM  Jessica Chan  has presented today for surgery, with the diagnosis of RIGHT KNEE MEDIAL AND Vina.  The various methods of treatment have been discussed with the patient and family. After consideration of risks, benefits and other options for treatment, the patient has consented to  Procedure(s): KNEE ARTHROSCOPY WITH MEDIAL AND LATERAL MENISECTOMIES (Right) as a surgical intervention.  The patient's history has been reviewed, patient examined, no change in status, stable for surgery.  I have reviewed the patient's chart and labs.  Questions were answered to the patient's satisfaction.     [name]

## 2021-02-09 ENCOUNTER — Encounter (HOSPITAL_BASED_OUTPATIENT_CLINIC_OR_DEPARTMENT_OTHER): Payer: Self-pay | Admitting: Orthopedic Surgery

## 2021-02-09 ENCOUNTER — Encounter: Payer: 59 | Admitting: Internal Medicine

## 2021-02-09 NOTE — Anesthesia Postprocedure Evaluation (Signed)
Anesthesia Post Note  Patient: KATIANNE BARRE  Procedure(s) Performed: KNEE ARTHROSCOPY WITH MEDIAL AND LATERAL MENISECTOMIES (Right: Knee)     Patient location during evaluation: PACU Anesthesia Type: General Level of consciousness: awake and alert Pain management: pain level controlled Vital Signs Assessment: post-procedure vital signs reviewed and stable Respiratory status: spontaneous breathing, nonlabored ventilation, respiratory function stable and patient connected to nasal cannula oxygen Cardiovascular status: blood pressure returned to baseline and stable Postop Assessment: no apparent nausea or vomiting Anesthetic complications: no   No notable events documented.  Last Vitals:  Vitals:   02/08/21 1700 02/08/21 1735  BP: 128/64 118/64  Pulse: 71 73  Resp: 12 14  Temp:  (!) 36.3 C  SpO2: 100% 100%    Last Pain:  Vitals:   02/08/21 1735  TempSrc:   PainSc: 3    Pain Goal: Patients Stated Pain Goal: 3 (02/08/21 1645)                 Catalina Gravel

## 2021-02-10 NOTE — Telephone Encounter (Signed)
Called Raliegh Ip and  S/W operator she states that she "thinks" they are the same but she will find out and call back.

## 2021-02-10 NOTE — Telephone Encounter (Signed)
Please clarify the request below. If the pt does not have an active preop request, we can remove this from the pool.

## 2021-02-22 ENCOUNTER — Other Ambulatory Visit: Payer: Self-pay

## 2021-02-22 ENCOUNTER — Telehealth (INDEPENDENT_AMBULATORY_CARE_PROVIDER_SITE_OTHER): Payer: 59 | Admitting: Family Medicine

## 2021-02-22 ENCOUNTER — Telehealth: Payer: 59 | Admitting: Internal Medicine

## 2021-02-22 DIAGNOSIS — U071 COVID-19: Secondary | ICD-10-CM | POA: Diagnosis not present

## 2021-02-22 NOTE — Progress Notes (Signed)
Patient ID: Jessica Chan, female   DOB: 05/21/1961, 60 y.o.   MRN: [id number]  This visit type was conducted due to national recommendations for restrictions regarding the COVID-19 pandemic in an effort to limit this patient's exposure and mitigate transmission in our community.   Virtual Visit via Video Note  I connected withNAME@ on 02/22/21 at 10:00 AM EDT by a video enabled telemedicine application and verified that I am speaking with the correct person using two identifiers.  Location patient: home Location provider:work or home office Persons participating in the virtual visit: patient, provider  I discussed the limitations of evaluation and management by telemedicine and the availability of in person appointments. The patient expressed understanding and agreed to proceed.   HPI: Ms. Mariane Masters developed symptoms of sore throat around 9 PM last night.  Through the night she developed headache, nasal congestion, mild body aches and this morning has some chills but no documented fever.  Friday she was around a large group of individuals in the close space for about 4 hours.  She had COVID test this morning which came back positive.  She has had Moderna vaccine with 1 booster.  Generally fairly healthy.  No chronic heart or lung problems.  No diabetes.  She states in general she is very intolerant of multiple medications and has easy nausea with several medications.   ROS: See pertinent positives and negatives per HPI.  Past Medical History:  Diagnosis Date   Allergic rhinitis    Allergy    Anxiety    Anxiety disorder    BLEPHARITIS 05/27/2007   Qualifier: Diagnosis of  By: Hulan Saas, CMA (AAMA), Larene Beach S    Depression    Pt. denies   Diverticulitis    Diverticulitis of colon - recurrent 10/02/2013   Diverticulosis    ESOPHAGEAL STRICTURE 11/15/2009   Qualifier: Diagnosis of  By: Trellis Paganini PA-c, Amy S    GERD (gastroesophageal reflux disease)    Hemangioma of liver    incidental  finding-large, right and left lobes of liver    Hiatal hernia    IBS (irritable bowel syndrome)    PONV (postoperative nausea and vomiting)    SEVERE NAUSEA & VOMITING   Sinusitis, acute maxillary 07/29/2012   right  hx of sinus surgery  fu if needed recurrecne     Wears glasses 02/02/2021    Past Surgical History:  Procedure Laterality Date   adnoidectomy     COLONOSCOPY     DILATATION & CURETTAGE/HYSTEROSCOPY WITH MYOSURE N/A 05/23/2016   Procedure: DILATATION & CURETTAGE/HYSTEROSCOPY WITH MYOSURE;  Surgeon: Terrance Mass, MD;  Location: [facility];  Service: Gynecology;  Laterality: N/A;  Request to post this for 12:30pm. That will leave room for a second major case for [place]. If we do not get a 2nd case in the next week I will move it up to follow. Thanks!  Needs one hour.  REQUEST FOR TINA RN AND AUDREY CST   endoscopy with dialation     KNEE ARTHROSCOPY WITH MEDIAL MENISECTOMY Right 02/08/2021   Procedure: KNEE ARTHROSCOPY WITH MEDIAL AND LATERAL MENISECTOMIES;  Surgeon: Marchia Bond, MD;  Location: [facility];  Service: Orthopedics;  Laterality: Right;   NASAL SINUS SURGERY     PROCTOSCOPY N/A 07/02/2014   Procedure: PROCTOSCOPY;  Surgeon: Michael Boston, MD;  Location: [facility];  Service: General;  Laterality: N/A;   ROBOT ASSISTED LAPAROSCOPIC PARTIAL COLECTOMY     TONSILLECTOMY     ulnar nerve right arm  UPPER GASTROINTESTINAL ENDOSCOPY      Family History  Problem Relation Age of Onset   Lung cancer Mother    Autism Son    Colon cancer Maternal Uncle    Heart disease Father    Lung cancer Father    Breast cancer Maternal Grandmother    Esophageal cancer Neg Hx    Rectal cancer Neg Hx    Stomach cancer Neg Hx     SOCIAL HX: Non-smoker   Current Outpatient Medications:    BIOTIN PO, Take 1 capsule by mouth daily., Disp: , Rfl:    citalopram (CELEXA) 20 MG tablet, TAKE 1 AND 1/2 TABLETS(30 MG) BY MOUTH DAILY, Disp: 45 tablet, Rfl: 0   gabapentin  (NEURONTIN) 100 MG capsule, TAKE 1 CAPSULE BY MOUTH AT BEDTIME. MAY INCREASE TO 4 CAPSULES AT BEDTIME AS TOLERATED, Disp: 120 capsule, Rfl: 0   HYDROcodone-acetaminophen (NORCO) 5-325 MG tablet, Take 1 tablet by mouth every 6 (six) hours as needed for moderate pain., Disp: 30 tablet, Rfl: 0   Melatonin 3 MG CAPS, Take by mouth. Takes one 3 mg chewy daily, Disp: , Rfl:    Multiple Vitamin (MULTIVITAMIN) tablet, Take 1 tablet by mouth every morning. , Disp: , Rfl:    Omega-3 Fatty Acids (FISH OIL PO), Take 1 capsule by mouth daily., Disp: , Rfl:    tretinoin (RETIN-A) 0.025 % cream, apply to affected area at bedtime TO DRY SKIN FOR ACNE, Disp: , Rfl: 0   zolpidem (AMBIEN) 10 MG tablet, TAKE 1 TABLET BY MOUTH AT BEDTIME AS NEEDED FOR SLEEP, Disp: 30 tablet, Rfl: 3  EXAM:  VITALS per patient if applicable:  GENERAL: alert, oriented, appears well and in no acute distress  HEENT: atraumatic, conjunttiva clear, no obvious abnormalities on inspection of external nose and ears  NECK: normal movements of the head and neck  LUNGS: on inspection no signs of respiratory distress, breathing rate appears normal, no obvious gross SOB, gasping or wheezing  CV: no obvious cyanosis  MS: moves all visible extremities without noticeable abnormality  PSYCH/NEURO: pleasant and cooperative, no obvious depression or anxiety, speech and thought processing grossly intact  ASSESSMENT AND PLAN:  Discussed the following assessment and plan:  COVID-19 infection.  Patient has relatively mild symptoms currently and relatively low risk.  -We discussed pros and cons of antiviral therapy.  At this point she declines. -Plenty of fluids and rest -Recommend over-the-counter analgesics such as Advil, Aleve, or Tylenol -Follow-up promptly for any worsening symptoms such as dyspnea -Reviewed CDC guidelines for isolation and she is aware     I discussed the assessment and treatment plan with the patient. The patient  was provided an opportunity to ask questions and all were answered. The patient agreed with the plan and demonstrated an understanding of the instructions.   The patient was advised to call back or seek an in-person evaluation if the symptoms worsen or if the condition fails to improve as anticipated.     Carolann Littler, MD

## 2021-02-23 ENCOUNTER — Encounter: Payer: Self-pay | Admitting: Family Medicine

## 2021-02-23 MED ORDER — HYDROCODONE BIT-HOMATROP MBR 5-1.5 MG/5ML PO SOLN: 5.0000 mL | Freq: Three times a day (TID) | ORAL | 0 refills | Status: DC | PRN

## 2021-02-23 NOTE — Telephone Encounter (Signed)
I sent in some Hycodan cough syrup.  It looks like she has been on this in the past.

## 2021-03-01 ENCOUNTER — Other Ambulatory Visit: Payer: Self-pay | Admitting: Internal Medicine

## 2021-03-07 ENCOUNTER — Other Ambulatory Visit: Payer: Self-pay | Admitting: Internal Medicine

## 2021-03-14 ENCOUNTER — Other Ambulatory Visit: Payer: Self-pay

## 2021-03-14 ENCOUNTER — Encounter: Payer: Self-pay | Admitting: Internal Medicine

## 2021-03-14 ENCOUNTER — Ambulatory Visit (INDEPENDENT_AMBULATORY_CARE_PROVIDER_SITE_OTHER): Payer: 59 | Admitting: Internal Medicine

## 2021-03-14 VITALS — BP 104/70 | HR 71 | Temp 98.5°F | Ht 64.5 in | Wt 105.4 lb

## 2021-03-14 DIAGNOSIS — Z0000 Encounter for general adult medical examination without abnormal findings: Secondary | ICD-10-CM | POA: Diagnosis not present

## 2021-03-14 DIAGNOSIS — Z79899 Other long term (current) drug therapy: Secondary | ICD-10-CM | POA: Diagnosis not present

## 2021-03-14 DIAGNOSIS — Z9049 Acquired absence of other specified parts of digestive tract: Secondary | ICD-10-CM

## 2021-03-14 DIAGNOSIS — G4700 Insomnia, unspecified: Secondary | ICD-10-CM | POA: Diagnosis not present

## 2021-03-14 DIAGNOSIS — F4323 Adjustment disorder with mixed anxiety and depressed mood: Secondary | ICD-10-CM

## 2021-03-14 DIAGNOSIS — Z8616 Personal history of COVID-19: Secondary | ICD-10-CM

## 2021-03-14 DIAGNOSIS — E785 Hyperlipidemia, unspecified: Secondary | ICD-10-CM

## 2021-03-14 MED ORDER — GABAPENTIN 100 MG PO CAPS: 500.0000 mg | ORAL_CAPSULE | Freq: Every day | ORAL | 3 refills | Status: DC

## 2021-03-14 NOTE — Patient Instructions (Addendum)
Good to see you today   Will order  500  equivalent of gabapentin.   If Gi problems    worsening  see DR Carlean Purl for opinion.    Health Maintenance, Female Adopting a healthy lifestyle and getting preventive care are important in promoting health and wellness. Ask your health care provider about: The right schedule for you to have regular tests and exams. Things you can do on your own to prevent diseases and keep yourself healthy. What should I know about diet, weight, and exercise? Eat a healthy diet  Eat a diet that includes plenty of vegetables, fruits, low-fat dairy products, and lean protein. Do not eat a lot of foods that are high in solid fats, added sugars, or sodium.  Maintain a healthy weight Body mass index (BMI) is used to identify weight problems. It estimates body fat based on height and weight. Your health care provider can help determineyour BMI and help you achieve or maintain a healthy weight. Get regular exercise Get regular exercise. This is one of the most important things you can do for your health. Most adults should: Exercise for at least 150 minutes each week. The exercise should increase your heart rate and make you sweat (moderate-intensity exercise). Do strengthening exercises at least twice a week. This is in addition to the moderate-intensity exercise. Spend less time sitting. Even light physical activity can be beneficial. Watch cholesterol and blood lipids Have your blood tested for lipids and cholesterol at 60 years of age, then havethis test every 5 years. Have your cholesterol levels checked more often if: Your lipid or cholesterol levels are high. You are older than 60 years of age. You are at high risk for heart disease. What should I know about cancer screening? Depending on your health history and family history, you may need to have cancer screening at various ages. This may include screening for: Breast cancer. Cervical cancer. Colorectal  cancer. Skin cancer. Lung cancer. What should I know about heart disease, diabetes, and high blood pressure? Blood pressure and heart disease High blood pressure causes heart disease and increases the risk of stroke. This is more likely to develop in people who have high blood pressure readings, are of African descent, or are overweight. Have your blood pressure checked: Every 3-5 years if you are 62-22 years of age. Every year if you are 54 years old or older. Diabetes Have regular diabetes screenings. This checks your fasting blood sugar level. Have the screening done: Once every three years after age 60 if you are at a normal weight and have a low risk for diabetes. More often and at a younger age if you are overweight or have a high risk for diabetes. What should I know about preventing infection? Hepatitis B If you have a higher risk for hepatitis B, you should be screened for this virus. Talk with your health care provider to find out if you are at risk forhepatitis B infection. Hepatitis C Testing is recommended for: Everyone born from [birth year] through 1965. Anyone with known risk factors for hepatitis C. Sexually transmitted infections (STIs) Get screened for STIs, including gonorrhea and chlamydia, if: You are sexually active and are younger than 60 years of age. You are older than 60 years of age and your health care provider tells you that you are at risk for this type of infection. Your sexual activity has changed since you were last screened, and you are at increased risk for chlamydia or gonorrhea. Ask your  health care provider if you are at risk. Ask your health care provider about whether you are at high risk for HIV. Your health care provider may recommend a prescription medicine to help prevent HIV infection. If you choose to take medicine to prevent HIV, you should first get tested for HIV. You should then be tested every 3 months for as long as you are taking the  medicine. Pregnancy If you are about to stop having your period (premenopausal) and you may become pregnant, seek counseling before you get pregnant. Take 400 to 800 micrograms (mcg) of folic acid every day if you become pregnant. Ask for birth control (contraception) if you want to prevent pregnancy. Osteoporosis and menopause Osteoporosis is a disease in which the bones lose minerals and strength with aging. This can result in bone fractures. If you are 61 years old or older, or if you are at risk for osteoporosis and fractures, ask your health care provider if you should: Be screened for bone loss. Take a calcium or vitamin D supplement to lower your risk of fractures. Be given hormone replacement therapy (HRT) to treat symptoms of menopause. Follow these instructions at home: Lifestyle Do not use any products that contain nicotine or tobacco, such as cigarettes, e-cigarettes, and chewing tobacco. If you need help quitting, ask your health care provider. Do not use street drugs. Do not share needles. Ask your health care provider for help if you need support or information about quitting drugs. Alcohol use Do not drink alcohol if: Your health care provider tells you not to drink. You are pregnant, may be pregnant, or are planning to become pregnant. If you drink alcohol: Limit how much you use to 0-1 drink a day. Limit intake if you are breastfeeding. Be aware of how much alcohol is in your drink. In the [place] one drink equals one 12 oz bottle of beer (355 mL), one 5 oz glass of wine (148 mL), or one 1 oz glass of hard liquor (44 mL). General instructions Schedule regular health, dental, and eye exams. Stay current with your vaccines. Tell your health care provider if: You often feel depressed. You have ever been abused or do not feel safe at home. Summary Adopting a healthy lifestyle and getting preventive care are important in promoting health and wellness. Follow your health  care provider's instructions about healthy diet, exercising, and getting tested or screened for diseases. Follow your health care provider's instructions on monitoring your cholesterol and blood pressure. This information is not intended to replace advice given to you by your health care provider. Make sure you discuss any questions you have with your healthcare provider. Document Revised: 07/03/2018 Document Reviewed: 07/03/2018 Elsevier Patient Education  2022 [employer].

## 2021-03-14 NOTE — Progress Notes (Signed)
Chief Complaint  Patient presents with   Annual Exam     HPI: Patient  Jessica Chan  60 y.o. comes in today for Preventive Health Care visit   Post knee surgery and covid  and minor cough left ,   smell still not back  Trying to not use Lorrin Mais   takes 1/2 if needed otherwise  gabapentin  400 and up to 500  Had covid aug 2  Sees dr G   Health Maintenance  Topic Date Due   Pneumococcal Vaccine 66-70 Years old (1 - PCV) Never done   HIV Screening  Never done   Zoster Vaccines- Shingrix (1 of 2) Never done   PAP SMEAR-Modifier  06/21/2020   COVID-19 Vaccine (4 - Booster for Moderna series) 09/05/2020   INFLUENZA VACCINE  02/21/2021   MAMMOGRAM  08/12/2022   TETANUS/TDAP  10/07/2027   COLONOSCOPY (Pts 45-15yr Insurance coverage will need to be confirmed)  05/11/2029   Hepatitis C Screening  Completed   HPV VACCINES  Aged Out   Health Maintenance Review LIFESTYLE:  Exercise:  used to be good knee   limiting  Tobacco/ETS: n Alcohol:  less    small dinner  Sugar beverages: n Sleep:  slowly getting better    can she have gabapentin   500  Drug use: no HH of      ROS:  bowel habit  2 x per day sometimes diarrhea depending on what eats.  GEN/ HEENT: No fever, significant weight changes sweats headaches vision problems hearing changes, CV/ PULM; No chest pain shortness of breath cough, syncope,edema  change in exercise tolerance. GI /GU: No adominal pain, vomiting, change in bowel habits. No blood in the stool. No significant GU symptoms. SKIN/HEME: ,no acute skin rashes suspicious lesions or bleeding. No lymphadenopathy, nodules, masses.  NEURO/ PSYCH:  No neurologic signs such as weakness numbness. No depression anxiety. IMM/ Allergy: No unusual infections.  Allergy .   REST of 12 system review negative except as per HPI   Past Medical History:  Diagnosis Date   Allergic rhinitis    Allergy    Anxiety    Anxiety disorder    BLEPHARITIS 05/27/2007   Qualifier:  Diagnosis of  By: CHulan Saas CMA (AAMA), SLarene BeachS    Depression    Pt. denies   Diverticulitis    Diverticulitis of colon - recurrent 10/02/2013   Diverticulosis    ESOPHAGEAL STRICTURE 11/15/2009   Qualifier: Diagnosis of  By: ETrellis PaganiniPA-c, Amy S    GERD (gastroesophageal reflux disease)    Hemangioma of liver    incidental finding-large, right and left lobes of liver    Hiatal hernia    IBS (irritable bowel syndrome)    PONV (postoperative nausea and vomiting)    SEVERE NAUSEA & VOMITING   Sinusitis, acute maxillary 07/29/2012   right  hx of sinus surgery  fu if needed recurrecne     Wears glasses 02/02/2021    Past Surgical History:  Procedure Laterality Date   adnoidectomy     COLONOSCOPY     DILATATION & CURETTAGE/HYSTEROSCOPY WITH MYOSURE N/A 05/23/2016   [place]  Surgeon: JTerrance Mass MD;  [facility];  Service: Gynecology;  Laterality: N/A;  Request to post this for 12:30pm. That will leave room for a second major case for [place] If we do not get a 2nd case in the next week I will move it up to follow. Thanks!  Needs one hour.  REQUEST FOR TINA RN AND AUDREY CST   endoscopy with dialation     KNEE ARTHROSCOPY WITH MEDIAL MENISECTOMY Right 02/08/2021   Procedure: KNEE ARTHROSCOPY WITH MEDIAL AND LATERAL MENISECTOMIES;  Surgeon: Marchia Bond, MD;  Location: [place];  Service: Orthopedics;  Laterality: Right;   NASAL SINUS SURGERY     PROCTOSCOPY N/A 07/02/2014   Procedure: PROCTOSCOPY;  Surgeon: Michael Boston, MD;  Location: [facility];  Service: General;  Laterality: N/A;   ROBOT ASSISTED LAPAROSCOPIC PARTIAL COLECTOMY     TONSILLECTOMY     ulnar nerve right arm     UPPER GASTROINTESTINAL ENDOSCOPY      Family History  Problem Relation Age of Onset   Lung cancer Mother    Autism Son    Colon cancer Maternal Uncle    Heart disease Father    Lung cancer Father    Breast cancer Maternal  Grandmother    Esophageal cancer Neg Hx    Rectal cancer Neg Hx    Stomach cancer Neg Hx     Social History   Socioeconomic History   Marital status: Married    Spouse name: Not on file   Number of children: 2   Years of education: Not on file   Highest education level: Not on file  Occupational History   Occupation: Physiological scientist: UNEMPLOYED  Tobacco Use   Smoking status: Never   Smokeless tobacco: Never  Vaping Use   Vaping Use: Never used  Substance and Sexual Activity   Alcohol use: Yes    Alcohol/week: 0.0 standard drinks    Comment: Social Drinker--SEVERAL DRINKS A WEEK   Drug use: No   Sexual activity: Yes    Partners: Male    Comment: 1st intercourse- 78, partners- 22 married- 47 yrs  Other Topics Concern   Not on file  Social History Narrative   Married HHof 4 one sone autistic   Husband had prostate cancer in last few years   Some exercise  Runs    No tobacco   Social Determinants of Health   Financial Resource Strain: Not on file  Food Insecurity: Not on file  Transportation Needs: Not on file  Physical Activity: Not on file  Stress: Not on file  Social Connections: Not on file    Outpatient Medications Prior to Visit  Medication Sig Dispense Refill   BIOTIN PO Take 1 capsule by mouth daily.     citalopram (CELEXA) 20 MG tablet TAKE 1 AND 1/2 TABLETS(30 MG) BY MOUTH DAILY 45 tablet 0   gabapentin (NEURONTIN) 100 MG capsule TAKE 1 CAPSULE BY MOUTH AT BEDTIME. MAY INCREASE TO 4 CAPSULES AT BEDTIME AS TOLERATED 120 capsule 0   HYDROcodone bit-homatropine (HYCODAN) 5-1.5 MG/5ML syrup Take 5 mLs by mouth every 8 (eight) hours as needed for cough. 120 mL 0   HYDROcodone-acetaminophen (NORCO) 5-325 MG tablet Take 1 tablet by mouth every 6 (six) hours as needed for moderate pain. 30 tablet 0   Melatonin 3 MG CAPS Take by mouth. Takes one 3 mg chewy daily     Multiple Vitamin (MULTIVITAMIN) tablet Take 1 tablet by mouth every morning.      Omega-3  Fatty Acids (FISH OIL PO) Take 1 capsule by mouth daily.     tretinoin (RETIN-A) 0.025 % cream apply to affected area at bedtime TO DRY SKIN FOR ACNE  0   zolpidem (AMBIEN) 10 MG tablet TAKE 1 TABLET BY MOUTH  AT BEDTIME AS NEEDED FOR SLEEP 30 tablet 3   No facility-administered medications prior to visit.     EXAM:  BP 104/70 (BP Location: Left Arm, Patient Position: Sitting, Cuff Size: Normal)   Pulse 71   Temp 98.5 F (36.9 C) (Oral)   Ht 5' 4.5" (1.638 m)   Wt 105 lb 6.4 oz (47.8 kg)   LMP 07/25/2011   SpO2 99%   BMI 17.81 kg/m   Body mass index is 17.81 kg/m. Wt Readings from Last 3 Encounters:  03/14/21 105 lb 6.4 oz (47.8 kg)  02/08/21 103 lb 6.4 oz (46.9 kg)  02/01/21 107 lb (48.5 kg)    Physical Exam: Vital signs reviewed [id number] is a well-developed well-nourished alert cooperative    who appearsr stated age in no acute distress.  HEENT: normocephalic atraumatic , Eyes: PERRL EOM's full, conjunctiva clear, Nares: paten,t no deformity discharge or tenderness., Ears: no deformity EAC's clear TMs with normal landmarks. Mouth: masked  NECK: supple without masses, thyromegaly or bruits. CHEST/PULM:  Clear to auscultation and percussion breath sounds equal no wheeze , rales or rhonchi. No chest wall deformities or tenderness. Breast: normal by inspection . No dimpling, discharge, masses, tenderness or discharge . CV: PMI is nondisplaced, S1 S2 no gallops, murmurs, rubs. Peripheral pulses are full without delay.No JVD .  ABDOMEN: Bowel sounds normal nontender  No guard or rebound, no hepato splenomegal no CVA tenderness.  No hernia. Extremtities:  No clubbing cyanosis or edema, no acute joint swelling or redness no focal atrophy NEURO:  Oriented x3, cranial nerves 3-12 appear to be intact, no obvious focal weakness,gait within normal limits no abnormal reflexes or asymmetrical SKIN: No acute rashes normal turgor, color, no bruising or petechiae. PSYCH: Oriented, good eye  contact, no obvious depression anxiety, cognition and judgment appear normal. LN: no cervical axillary inguinal adenopathy  Lab Results  Component Value Date   WBC 5.6 03/14/2021   HGB 12.4 03/14/2021   HCT 36.9 03/14/2021   PLT 227.0 03/14/2021   GLUCOSE 77 03/14/2021   CHOL 202 (H) 03/14/2021   TRIG 80.0 03/14/2021   HDL 74.90 03/14/2021   LDLCALC 111 (H) 03/14/2021   ALT 11 03/14/2021   AST 19 03/14/2021   NA 139 03/14/2021   K 4.9 03/14/2021   CL 102 03/14/2021   CREATININE 0.92 03/14/2021   BUN 18 03/14/2021   CO2 29 03/14/2021   TSH 3.01 03/14/2021   HGBA1C 5.1 12/25/2018    BP Readings from Last 3 Encounters:  03/14/21 104/70  02/08/21 118/64  02/01/21 136/84    Lab plan reviewed with patient   ASSESSMENT AND PLAN:  Discussed the following assessment and plan:    ICD-10-CM   1. Visit for preventive health examination  123456 Basic metabolic panel    CBC with Differential/Platelet    Hepatic function panel    Lipid panel    TSH    TSH    Lipid panel    Hepatic function panel    CBC with Differential/Platelet    Basic metabolic panel    2. Medication management  123456 Basic metabolic panel    CBC with Differential/Platelet    Hepatic function panel    Lipid panel    TSH    TSH    Lipid panel    Hepatic function panel    CBC with Differential/Platelet    Basic metabolic panel    3. Insomnia, unspecified type  G47.00     4. Hyperlipidemia,  unspecified hyperlipidemia type  [id number] Basic metabolic panel    CBC with Differential/Platelet    Hepatic function panel    Lipid panel    TSH    TSH    Lipid panel    Hepatic function panel    CBC with Differential/Platelet    Basic metabolic panel    5. History of COVID-19  [id number] Basic metabolic panel    CBC with Differential/Platelet    Hepatic function panel    Lipid panel    TSH    TSH    Lipid panel    Hepatic function panel    CBC with Differential/Platelet    Basic metabolic panel     6. History of partial colectomy  [id number] Basic metabolic panel    CBC with Differential/Platelet    Hepatic function panel    Lipid panel    TSH    TSH    Lipid panel    Hepatic function panel    CBC with Differential/Platelet    Basic metabolic panel    7. Adjustment reaction with anxiety and depression  F43.23     Sleep issues still a problem try gabapentin 500 mg and minimal use of Ambien. She is doing much better external factors. Weight has been stable recently. Continue citalopram seems to still be helpful Return in about 1 year (around 03/14/2022) for depending on results.  Patient Care Team: Karlos Scadden, Standley Brooking, MD as PCP - General Carlean Purl Ofilia Neas, MD as Attending Physician (Gastroenterology) Patient Instructions  Good to see you today   Will order  500  equivalent of gabapentin.   If Gi problems    worsening  see DR Carlean Purl for opinion.    Health Maintenance, Female Adopting a healthy lifestyle and getting preventive care are important in promoting health and wellness. Ask your health care provider about: The right schedule for you to have regular tests and exams. Things you can do on your own to prevent diseases and keep yourself healthy. What should I know about diet, weight, and exercise? Eat a healthy diet  Eat a diet that includes plenty of vegetables, fruits, low-fat dairy products, and lean protein. Do not eat a lot of foods that are high in solid fats, added sugars, or sodium.  Maintain a healthy weight Body mass index (BMI) is used to identify weight problems. It estimates body fat based on height and weight. Your health care provider can help determineyour BMI and help you achieve or maintain a healthy weight. Get regular exercise Get regular exercise. This is one of the most important things you can do for your health. Most adults should: Exercise for at least 150 minutes each week. The exercise should increase your heart rate and make you sweat  (moderate-intensity exercise). Do strengthening exercises at least twice a week. This is in addition to the moderate-intensity exercise. Spend less time sitting. Even light physical activity can be beneficial. Watch cholesterol and blood lipids Have your blood tested for lipids and cholesterol at 60 years of age, then havethis test every 5 years. Have your cholesterol levels checked more often if: Your lipid or cholesterol levels are high. You are older than 60 years of age. You are at high risk for heart disease. What should I know about cancer screening? Depending on your health history and family history, you may need to have cancer screening at various ages. This may include screening for: Breast cancer. Cervical cancer. Colorectal cancer. Skin cancer. Lung cancer. What should I  know about heart disease, diabetes, and high blood pressure? Blood pressure and heart disease High blood pressure causes heart disease and increases the risk of stroke. This is more likely to develop in people who have high blood pressure readings, are of African descent, or are overweight. Have your blood pressure checked: Every 3-5 years if you are 2-76 years of age. Every year if you are 43 years old or older. Diabetes Have regular diabetes screenings. This checks your fasting blood sugar level. Have the screening done: Once every three years after age 27 if you are at a normal weight and have a low risk for diabetes. More often and at a younger age if you are overweight or have a high risk for diabetes. What should I know about preventing infection? Hepatitis B If you have a higher risk for hepatitis B, you should be screened for this virus. Talk with your health care provider to find out if you are at risk forhepatitis B infection. Hepatitis C Testing is recommended for: Everyone born from [birth year] through 1965. Anyone with known risk factors for hepatitis C. Sexually transmitted infections (STIs) Get  screened for STIs, including gonorrhea and chlamydia, if: You are sexually active and are younger than 60 years of age. You are older than 60 years of age and your health care provider tells you that you are at risk for this type of infection. Your sexual activity has changed since you were last screened, and you are at increased risk for chlamydia or gonorrhea. Ask your health care provider if you are at risk. Ask your health care provider about whether you are at high risk for HIV. Your health care provider may recommend a prescription medicine to help prevent HIV infection. If you choose to take medicine to prevent HIV, you should first get tested for HIV. You should then be tested every 3 months for as long as you are taking the medicine. Pregnancy If you are about to stop having your period (premenopausal) and you may become pregnant, seek counseling before you get pregnant. Take 400 to 800 micrograms (mcg) of folic acid every day if you become pregnant. Ask for birth control (contraception) if you want to prevent pregnancy. Osteoporosis and menopause Osteoporosis is a disease in which the bones lose minerals and strength with aging. This can result in bone fractures. If you are 58 years old or older, or if you are at risk for osteoporosis and fractures, ask your health care provider if you should: Be screened for bone loss. Take a calcium or vitamin D supplement to lower your risk of fractures. Be given hormone replacement therapy (HRT) to treat symptoms of menopause. Follow these instructions at home: Lifestyle Do not use any products that contain nicotine or tobacco, such as cigarettes, e-cigarettes, and chewing tobacco. If you need help quitting, ask your health care provider. Do not use street drugs. Do not share needles. Ask your health care provider for help if you need support or information about quitting drugs. Alcohol use Do not drink alcohol if: Your health care provider tells  you not to drink. You are pregnant, may be pregnant, or are planning to become pregnant. If you drink alcohol: Limit how much you use to 0-1 drink a day. Limit intake if you are breastfeeding. Be aware of how much alcohol is in your drink. In the [place] one drink equals one 12 oz bottle of beer (355 mL), one 5 oz glass of wine (148 mL), or one 1 oz  glass of hard liquor (44 mL). General instructions Schedule regular health, dental, and eye exams. Stay current with your vaccines. Tell your health care provider if: You often feel depressed. You have ever been abused or do not feel safe at home. Summary Adopting a healthy lifestyle and getting preventive care are important in promoting health and wellness. Follow your health care provider's instructions about healthy diet, exercising, and getting tested or screened for diseases. Follow your health care provider's instructions on monitoring your cholesterol and blood pressure. This information is not intended to replace advice given to you by your health care provider. Make sure you discuss any questions you have with your healthcare provider. Document Revised: 07/03/2018 Document Reviewed: 07/03/2018 Elsevier Patient Education  2022 [employer]. Nickie Warwick M.D.

## 2021-03-15 LAB — CBC WITH DIFFERENTIAL/PLATELET
Basophils Absolute: 0 10*3/uL (ref 0.0–0.1)
Basophils Relative: 0.7 % (ref 0.0–3.0)
Eosinophils Absolute: 0.1 10*3/uL (ref 0.0–0.7)
Eosinophils Relative: 1.3 % (ref 0.0–5.0)
HCT: 36.9 % (ref 36.0–46.0)
Hemoglobin: 12.4 g/dL (ref 12.0–15.0)
Lymphocytes Relative: 21.1 % (ref 12.0–46.0)
Lymphs Abs: 1.2 10*3/uL (ref 0.7–4.0)
MCHC: 33.5 g/dL (ref 30.0–36.0)
MCV: 95.9 fl (ref 78.0–100.0)
Monocytes Absolute: 0.4 10*3/uL (ref 0.1–1.0)
Monocytes Relative: 8 % (ref 3.0–12.0)
Neutro Abs: 3.9 10*3/uL (ref 1.4–7.7)
Neutrophils Relative %: 68.9 % (ref 43.0–77.0)
Platelets: 227 10*3/uL (ref 150.0–400.0)
RBC: 3.85 Mil/uL — ABNORMAL LOW (ref 3.87–5.11)
RDW: 14 % (ref 11.5–15.5)
WBC: 5.6 10*3/uL (ref 4.0–10.5)

## 2021-03-15 LAB — LIPID PANEL
Cholesterol: 202 mg/dL — ABNORMAL HIGH (ref 0–200)
HDL: 74.9 mg/dL (ref >39.00)
LDL Cholesterol: 111 mg/dL — ABNORMAL HIGH (ref 0–99)
NonHDL: 127.16
Total CHOL/HDL Ratio: 3
Triglycerides: 80 mg/dL (ref 0.0–149.0)
VLDL: 16 mg/dL (ref 0.0–40.0)

## 2021-03-15 LAB — BASIC METABOLIC PANEL
BUN: 18 mg/dL (ref 6–23)
CO2: 29 mEq/L (ref 19–32)
Calcium: 9.3 mg/dL (ref 8.4–10.5)
Chloride: 102 mEq/L (ref 96–112)
Creatinine, Ser: 0.92 mg/dL (ref 0.40–1.20)
GFR: 67.67 mL/min (ref >60.00)
Glucose, Bld: 77 mg/dL (ref 70–99)
Potassium: 4.9 mEq/L (ref 3.5–5.1)
Sodium: 139 mEq/L (ref 135–145)

## 2021-03-15 LAB — HEPATIC FUNCTION PANEL
ALT: 11 U/L (ref 0–35)
AST: 19 U/L (ref 0–37)
Albumin: 4.5 g/dL (ref 3.5–5.2)
Alkaline Phosphatase: 56 U/L (ref 39–117)
Bilirubin, Direct: 0.1 mg/dL (ref 0.0–0.3)
Total Bilirubin: 0.6 mg/dL (ref 0.2–1.2)
Total Protein: 6.8 g/dL (ref 6.0–8.3)

## 2021-03-15 LAB — TSH: TSH: 3.01 u[IU]/mL (ref 0.35–5.50)

## 2021-03-31 NOTE — Progress Notes (Signed)
Stable readings  cholesterol up slightly from last year  but no worrisome. The 10-year ASCVD risk score (Arnett DK, et al., 2019) is: 1.8%   Values used to calculate the score:     Age: 60 years     Sex: Female     Is Non-Hispanic African American: No     Diabetic: No     Tobacco smoker: No     Systolic Blood Pressure: 123456 mmHg     Is BP treated: No     HDL Cholesterol: 74.9 mg/dL     Total Cholesterol: 202 mg/dL

## 2021-04-04 ENCOUNTER — Other Ambulatory Visit: Payer: Self-pay | Admitting: Internal Medicine

## 2021-04-10 ENCOUNTER — Other Ambulatory Visit: Payer: Self-pay | Admitting: Internal Medicine

## 2021-07-12 ENCOUNTER — Ambulatory Visit (INDEPENDENT_AMBULATORY_CARE_PROVIDER_SITE_OTHER): Payer: 59 | Admitting: Obstetrics & Gynecology

## 2021-07-12 ENCOUNTER — Other Ambulatory Visit: Payer: Self-pay

## 2021-07-12 ENCOUNTER — Encounter: Payer: Self-pay | Admitting: Obstetrics & Gynecology

## 2021-07-12 VITALS — BP 114/70 | HR 90 | Ht 65.5 in | Wt 108.0 lb

## 2021-07-12 DIAGNOSIS — Z01419 Encounter for gynecological examination (general) (routine) without abnormal findings: Secondary | ICD-10-CM

## 2021-07-12 DIAGNOSIS — Z780 Asymptomatic menopausal state: Secondary | ICD-10-CM | POA: Diagnosis not present

## 2021-07-12 DIAGNOSIS — Z9189 Other specified personal risk factors, not elsewhere classified: Secondary | ICD-10-CM

## 2021-07-12 NOTE — Progress Notes (Signed)
Jessica Chan 07-20-1961 [id number]   History:    60 y.o. G2P2L2 married. Husband retired 06/2019.  Son 76 yo with autism at home.  Oldest son in [place].   RP:  Established patient presenting for annual gyn exam    HPI: Postmenopause, well on no hormone replacement therapy.  No postmenopausal bleeding.  No pelvic pain.  Sexually active, using Coconut oil.  Pap Neg 06/2020.  No significant h/o abnormal Pap. Urine normal, no SUI.  Bowel movements normal. Breasts normal.  Body mass index 17.7.  Exercising on-off and healthy nutrition.  BD normal 2016.  Colono 2020.  Health labs with family physician, Dr Regis Bill.   Past medical history,surgical history, family history and social history were all reviewed and documented in the EPIC chart.  Gynecologic History Patient's last menstrual period was 07/25/2011.  Obstetric History OB History  Gravida Para Term Preterm AB Living  2 2       2   SAB IAB Ectopic Multiple Live Births               # Outcome Date GA Lbr Len/2nd Weight Sex Delivery Anes PTL Lv  2 Para     M Vag-Spont     1 Para     M Vag-Spont        ROS: A ROS was performed and pertinent positives and negatives are included in the history.  GENERAL: No fevers or chills. HEENT: No change in vision, no earache, sore throat or sinus congestion. NECK: No pain or stiffness. CARDIOVASCULAR: No chest pain or pressure. No palpitations. PULMONARY: No shortness of breath, cough or wheeze. GASTROINTESTINAL: No abdominal pain, nausea, vomiting or diarrhea, melena or bright red blood per rectum. GENITOURINARY: No urinary frequency, urgency, hesitancy or dysuria. MUSCULOSKELETAL: No joint or muscle pain, no back pain, no recent trauma. DERMATOLOGIC: No rash, no itching, no lesions. ENDOCRINE: No polyuria, polydipsia, no heat or cold intolerance. No recent change in weight. HEMATOLOGICAL: No anemia or easy bruising or bleeding. NEUROLOGIC: No headache, seizures, numbness, tingling or weakness.  PSYCHIATRIC: No depression, no loss of interest in normal activity or change in sleep pattern.     Exam:   BP 114/70    Pulse 90    Ht 5' 5.5" (1.664 m)    Wt 108 lb (49 kg)    LMP 07/25/2011    SpO2 99%    BMI 17.70 kg/m   Body mass index is 17.7 kg/m.  General appearance : Well developed well nourished female. No acute distress HEENT: Eyes: no retinal hemorrhage or exudates,  Neck supple, trachea midline, no carotid bruits, no thyroidmegaly Lungs: Clear to auscultation, no rhonchi or wheezes, or rib retractions  Heart: Regular rate and rhythm, no murmurs or gallops Breast:Examined in sitting and supine position were symmetrical in appearance, no palpable masses or tenderness,  no skin retraction, no nipple inversion, no nipple discharge, no skin discoloration, no axillary or supraclavicular lymphadenopathy Abdomen: no palpable masses or tenderness, no rebound or guarding Extremities: no edema or skin discoloration or tenderness  Pelvic: Vulva: Normal             Vagina: No gross lesions or discharge  Cervix: No gross lesions or discharge  Uterus  AV, normal size, shape and consistency, non-tender and mobile  Adnexa  Without masses or tenderness  Anus: Normal   Assessment/Plan:  60 y.o. female for annual exam   1. Well female exam with routine gynecological exam Postmenopause, well on no  hormone replacement therapy.  No postmenopausal bleeding.  No pelvic pain.  Sexually active, using Coconut oil.  Pap Neg 06/2020.  No significant h/o abnormal Pap. Urine normal, no SUI.  Bowel movements normal. Breasts normal.  Body mass index 17.7.  Exercising on-off and healthy nutrition.  BD normal 2016.  Colono 2020.  Health labs with family physician, Dr Regis Bill.   2. Postmenopausal Postmenopause, well on no hormone replacement therapy.  No postmenopausal bleeding.  No pelvic pain.  Sexually active, using Coconut oil.  BD Normal in 2016.  Vit D, Ca++, wt bearing physical activities.  Will  repeat BD at age 60.  18. Other specified personal risk factors, not elsewhere classified   Princess Bruins MD, 9:23 AM 07/12/2021

## 2021-07-15 ENCOUNTER — Other Ambulatory Visit: Payer: Self-pay | Admitting: Internal Medicine

## 2021-07-18 ENCOUNTER — Encounter: Payer: Self-pay | Admitting: Internal Medicine

## 2021-07-19 NOTE — Telephone Encounter (Signed)
Please advise 

## 2021-07-19 NOTE — Telephone Encounter (Signed)
So usually I recommend Mucinex DM or similar However prescription cough medicines can include promethazine with DM hydrocodone or Tessalon Perles.  These advise which medicine worked best for you.   I am not sure that an out-of-state pharmacy will receive and fill a controlled substance cough medicine such as Hycodan electronically.

## 2021-07-25 ENCOUNTER — Telehealth: Payer: Self-pay | Admitting: Physician Assistant

## 2021-07-25 DIAGNOSIS — J208 Acute bronchitis due to other specified organisms: Secondary | ICD-10-CM

## 2021-07-25 DIAGNOSIS — B9689 Other specified bacterial agents as the cause of diseases classified elsewhere: Secondary | ICD-10-CM

## 2021-07-25 MED ORDER — DOXYCYCLINE HYCLATE 100 MG PO TABS
100.0000 mg | ORAL_TABLET | Freq: Two times a day (BID) | ORAL | 0 refills | Status: DC
Start: 2021-07-25 — End: 2022-04-05

## 2021-07-25 MED ORDER — BENZONATATE 100 MG PO CAPS: 100.0000 mg | ORAL_CAPSULE | Freq: Three times a day (TID) | ORAL | 0 refills | Status: DC | PRN

## 2021-07-25 NOTE — Progress Notes (Signed)
I have spent 5 minutes in review of e-visit questionnaire, review and updating patient chart, medical decision making and response to patient.   Jessica Pasquarelli Cody Emory Gallentine, PA-C    

## 2021-07-25 NOTE — Progress Notes (Signed)

## 2021-07-29 ENCOUNTER — Encounter: Payer: Self-pay | Admitting: Internal Medicine

## 2021-08-04 ENCOUNTER — Other Ambulatory Visit: Payer: Self-pay | Admitting: Internal Medicine

## 2021-08-04 DIAGNOSIS — Z1231 Encounter for screening mammogram for malignant neoplasm of breast: Secondary | ICD-10-CM

## 2021-08-10 ENCOUNTER — Other Ambulatory Visit: Payer: Self-pay | Admitting: Internal Medicine

## 2021-08-23 ENCOUNTER — Ambulatory Visit
Admission: RE | Admit: 2021-08-23 | Discharge: 2021-08-23 | Disposition: A | Discharge status: Home / Self Care | Payer: 59 | Source: Ambulatory Visit | Attending: Internal Medicine | Admitting: Internal Medicine

## 2021-08-23 DIAGNOSIS — Z1231 Encounter for screening mammogram for malignant neoplasm of breast: Secondary | ICD-10-CM

## 2021-11-03 ENCOUNTER — Encounter: Payer: Self-pay | Admitting: Internal Medicine

## 2021-11-03 MED ORDER — ALPRAZOLAM 0.5 MG PO TABS: 0.5000 mg | ORAL_TABLET | Freq: Two times a day (BID) | ORAL | 0 refills | Status: DC | PRN

## 2021-11-03 MED ORDER — CITALOPRAM HYDROBROMIDE 20 MG PO TABS: 20.0000 mg | ORAL_TABLET | Freq: Two times a day (BID) | ORAL | 0 refills | Status: DC

## 2021-11-03 NOTE — Telephone Encounter (Signed)
Sorry things are more stressful. ?I sent in a refill for alprazolam  ?Yes you can  increase the citalopram to 40 mg per day ? ?Take 20 mg twice a day  I sent in this also  ? ?Make a fu visit in 3-4 weeks ( can do virtual if works better for you)  ? ?

## 2021-11-17 ENCOUNTER — Other Ambulatory Visit: Payer: Self-pay | Admitting: Internal Medicine

## 2022-04-05 ENCOUNTER — Ambulatory Visit (INDEPENDENT_AMBULATORY_CARE_PROVIDER_SITE_OTHER): Payer: 59 | Admitting: Internal Medicine

## 2022-04-05 ENCOUNTER — Encounter: Payer: Self-pay | Admitting: Internal Medicine

## 2022-04-05 VITALS — BP 118/70 | HR 61 | Ht 65.0 in | Wt 107.0 lb

## 2022-04-05 DIAGNOSIS — G4700 Insomnia, unspecified: Secondary | ICD-10-CM | POA: Diagnosis not present

## 2022-04-05 DIAGNOSIS — Z8349 Family history of other endocrine, nutritional and metabolic diseases: Secondary | ICD-10-CM

## 2022-04-05 DIAGNOSIS — Z79899 Other long term (current) drug therapy: Secondary | ICD-10-CM

## 2022-04-05 DIAGNOSIS — Z9049 Acquired absence of other specified parts of digestive tract: Secondary | ICD-10-CM | POA: Diagnosis not present

## 2022-04-05 DIAGNOSIS — Z0000 Encounter for general adult medical examination without abnormal findings: Secondary | ICD-10-CM | POA: Diagnosis not present

## 2022-04-05 DIAGNOSIS — E785 Hyperlipidemia, unspecified: Secondary | ICD-10-CM | POA: Diagnosis not present

## 2022-04-05 DIAGNOSIS — Z23 Encounter for immunization: Secondary | ICD-10-CM | POA: Diagnosis not present

## 2022-04-05 MED ORDER — GABAPENTIN 100 MG PO CAPS: ORAL_CAPSULE | ORAL | 11 refills | Status: DC

## 2022-04-05 MED ORDER — CITALOPRAM HYDROBROMIDE 20 MG PO TABS: 20.0000 mg | ORAL_TABLET | Freq: Two times a day (BID) | ORAL | 3 refills | Status: DC

## 2022-04-05 NOTE — Progress Notes (Signed)
Chief Complaint  Patient presents with   Annual Exam    HPI: Patient  Jessica Chan  61 y.o. comes in today for Jessica Chan visit and follow-up meds  Sleep is much better some   on 500 mg of gabapentin  and seems to help he rarely uses the Ambien and cannot remember the last time she used Apresoline. She is taking citalopram 20 mg 2 a day equivalent.  Needs refills.  No major change in health.  Is exercising some eating reasonably healthy GI wise gets some symptoms off and on but nothing alarming to be able to need to go back to see Jessica Chan. Has GYN Dr. Dellis Chan while Jessica Chan up-to-date on Pap smear  [place] about immunizations. Like to get the flu vaccine today Health Maintenance  Topic Date Due   HIV Screening  Never done   Zoster Vaccines- Shingrix (1 of 2) Never done   PAP SMEAR-Modifier  06/21/2020   COVID-19 Vaccine (3 - Moderna risk series) 07/03/2020   MAMMOGRAM  08/24/2023   TETANUS/TDAP  10/07/2027   COLONOSCOPY (Pts 45-60yr Insurance coverage will need to be confirmed)  05/11/2029   INFLUENZA VACCINE  Completed   Hepatitis C Screening  Completed   HPV VACCINES  Aged Out   Health Maintenance Review LIFESTYLE:  Exercise:   active   Tobacco/ETS: n Alcohol:  dec  ocass  Sugar beverages: creamer in coffee  Sleep:better  Drug use: no HH of  3 2 pet dAdministrator, 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 ROS:  REST of 12 system review negative except as per HPI no cardiovascular or pulmonary symptoms. Gets occasional distal white tip of finger in the cold but no full hands were nodes.  May have occasional hand pain but no persistent arthritis symptoms  Past Medical History:  Diagnosis Date   Allergic rhinitis    Allergy    Anxiety    Anxiety disorder    BLEPHARITIS 05/27/2007   Qualifier: Diagnosis of  By: Jessica Chan (AAMA), Jessica Chan    Depression    Pt. denies   Diverticulitis    Diverticulitis of colon - recurrent 10/02/2013    Diverticulosis    ESOPHAGEAL STRICTURE 11/15/2009   Qualifier: Diagnosis of  By: Jessica Chan, Jessica Chan    GERD (gastroesophageal reflux disease)    Hemangioma of liver    incidental finding-large, right and left lobes of liver    Hiatal hernia    IBS (irritable bowel syndrome)    PONV (postoperative nausea and vomiting)    SEVERE NAUSEA & VOMITING   Sinusitis, acute maxillary 07/29/2012   right  hx of sinus surgery  fu if needed recurrecne     Wears glasses 02/02/2021    Past Surgical History:  Procedure Laterality Date   adnoidectomy     COLONOSCOPY     DILATATION & CURETTAGE/HYSTEROSCOPY WITH MYOSURE N/A 05/23/2016   Procedure: [place]  Surgeon: JTerrance Mass MD;  [facility];  Service: Gynecology;  Laterality: N/A;  Request to post this for 12:30pm. That will leave room for a second major case for u[place] If we do not get a 2nd case in the next week I will move it up to follow. Thanks!  Needs one hour.  REQUEST FOR TINA RN AND Jessica Chan CST   endoscopy with dialation     KNEE ARTHROSCOPY WITH MEDIAL MENISECTOMY Right 02/08/2021   Procedure: KNEE ARTHROSCOPY WITH MEDIAL AND LATERAL MENISECTOMIES;  Surgeon: Jessica Bond, MD;  Location: [facility];  Service: Orthopedics;  Laterality: Right;   NASAL SINUS SURGERY     PROCTOSCOPY N/A 07/02/2014   Procedure: PROCTOSCOPY;  Surgeon: Jessica Boston, MD;  Location: [facility];  Service: General;  Laterality: N/A;   ROBOT ASSISTED LAPAROSCOPIC PARTIAL COLECTOMY     TONSILLECTOMY     ulnar nerve right arm     UPPER GASTROINTESTINAL ENDOSCOPY      Family History  Problem Relation Age of Onset   Lung cancer Mother    Autism Son    Colon cancer Maternal Uncle    Heart disease Father    Lung cancer Father    Breast cancer Maternal Grandmother    Esophageal cancer Neg Hx    Rectal cancer Neg Hx    Stomach cancer Neg Hx     Social History   Socioeconomic History   Marital  status: Married    Spouse name: Not on file   Number of children: 2   Years of education: Not on file   Highest education level: Not on file  Occupational History   Occupation: Physiological scientist: UNEMPLOYED  Tobacco Use   Smoking status: Never   Smokeless tobacco: Never  Vaping Use   Vaping Use: Never used  Substance and Sexual Activity   Alcohol use: Yes    Alcohol/week: 0.0 standard drinks of alcohol    Comment: Social Drinker--SEVERAL DRINKS A WEEK   Drug use: No   Sexual activity: Yes    Partners: Male    Birth control/protection: Post-menopausal    Comment: 1st intercourse- 17, partners- >5 married- 56 yrs  Other Topics Concern   Not on file  Social History Narrative   Married HHof 4 one sone autistic   Husband had prostate cancer in last few years   Some exercise  Runs    No tobacco   Social Determinants of Health   Financial Resource Strain: Not on file  Food Insecurity: Not on file  Transportation Needs: Not on file  Physical Activity: Not on file  Stress: Not on file  Social Connections: Not on file    Outpatient Medications Prior to Visit  Medication Sig Dispense Refill   ALPRAZolam (XANAX) 0.5 MG tablet Take 1-2 tablets (0.5-1 mg total) by mouth 2 (two) times daily as needed for anxiety. 20 tablet 0   benzonatate (TESSALON) 100 MG capsule Take 1 capsule (100 mg total) by mouth 3 (three) times daily as needed for cough. 30 capsule 0   BIOTIN PO Take 1 capsule by mouth daily.     Melatonin 3 MG CAPS Take by mouth. Takes one 3 mg chewy daily     Multiple Vitamin (MULTIVITAMIN) tablet Take 1 tablet by mouth every morning.      Omega-3 Fatty Acids (FISH OIL PO) Take 1 capsule by mouth daily.     tretinoin (RETIN-A) 0.025 % cream apply to affected area at bedtime TO DRY SKIN FOR ACNE  0   zolpidem (AMBIEN) 10 MG tablet TAKE 1 TABLET BY MOUTH AT BEDTIME AS NEEDED FOR SLEEP 30 tablet 3   citalopram (CELEXA) 20 MG tablet Take 1 tablet (20 mg total) by mouth 2  (two) times daily. Dosage change 180 tablet 0   gabapentin (NEURONTIN) 100 MG capsule TAKE 5 CAPSULES(500 MG) BY MOUTH AT BEDTIME 150 capsule 3   doxycycline (VIBRA-TABS) 100 MG tablet Take 1 tablet (100 mg total) by mouth 2 (two) times daily. (Patient  not taking: Reported on 04/05/2022) 14 tablet 0   No facility-administered medications prior to visit.     EXAM:  BP 118/70   Pulse 61   Ht '5\' 5"'$  (1.651 m)   Wt 107 lb (48.5 kg)   LMP 07/25/2011   SpO2 98%   BMI 17.81 kg/m   Body Chan index is 17.81 kg/m. Wt Readings from Last 3 Encounters:  04/05/22 107 lb (48.5 kg)  07/12/21 108 lb (49 kg)  03/14/21 105 lb 6.4 oz (47.8 kg)    Physical Exam: Vital signs reviewed SFK:CLEX is a well-developed well-nourished alert cooperative    who appearsr stated age in no acute distress.  HEENT: normocephalic atraumatic , Eyes: PERRL EOM'Chan full, conjunctiva clear,  Nares: paten,t no deformity discharge or tenderness.,  Eyes wears glasses ears: no deformity EAC'Chan clear TMs with normal landmarks. Mouth: clear OP, no lesions, edema.  Moist mucous membranes. Dentition in adequate repair. NECK: supple without masses, thyromegaly or bruits. CHEST/PULM:  Clear to auscultation and percussion breath sounds equal no wheeze , rales or rhonchi. No chest wall deformities or tenderness. Breast: normal by inspection . No dimpling, discharge, masses, tenderness or discharge . CV: PMI is nondisplaced, S1 S2 no gallops, murmurs, rubs. Peripheral pulses are full without delay.No JVD .  ABDOMEN: Bowel sounds normal nontender  No guard or rebound, no hepato splenomegal no CVA tenderness.   Extremtities:  No clubbing cyanosis or edema, no acute joint swelling or redness no focal atrophy NEURO:  Oriented x3, cranial nerves 3-12 appear to be intact, no obvious focal weakness,gait within normal limits no abnormal reflexes or asymmetrical SKIN: No acute rashes normal turgor, color, no bruising or petechiae. PSYCH:  Oriented, good eye contact, no obvious depression anxiety, cognition and judgment appear normal. LN: no cervical axillary inguinal adenopathy  Lab Results  Component Value Date   WBC 5.6 03/14/2021   HGB 12.4 03/14/2021   HCT 36.9 03/14/2021   PLT 227.0 03/14/2021   GLUCOSE 77 03/14/2021   CHOL 202 (H) 03/14/2021   TRIG 80.0 03/14/2021   HDL 74.90 03/14/2021   LDLCALC 111 (H) 03/14/2021   ALT 11 03/14/2021   AST 19 03/14/2021   NA 139 03/14/2021   K 4.9 03/14/2021   CL 102 03/14/2021   CREATININE 0.92 03/14/2021   BUN 18 03/14/2021   CO2 29 03/14/2021   TSH 3.01 03/14/2021   HGBA1C 5.1 12/25/2018    BP Readings from Last 3 Encounters:  04/05/22 118/70  07/12/21 114/70  03/14/21 104/70    Labplan reviewed with patient  Nf  cabbage and   ginger dressing pasta ASSESSMENT AND PLAN:  Discussed the following assessment and plan:    ICD-10-CM   1. Visit for preventive health examination  N17.00 Basic metabolic panel    CBC with Differential/Platelet    Hepatic function panel    Lipid panel    TSH    TSH    Lipid panel    Hepatic function panel    CBC with Differential/Platelet    Basic metabolic panel    2. Medication management  F74.944 Basic metabolic panel    CBC with Differential/Platelet    Hepatic function panel    Lipid panel    TSH    TSH    Lipid panel    Hepatic function panel    CBC with Differential/Platelet    Basic metabolic panel    3. Insomnia, unspecified type   H67.59 Basic metabolic panel  CBC with Differential/Platelet    Hepatic function panel    Lipid panel    TSH    TSH    Lipid panel    Hepatic function panel    CBC with Differential/Platelet    Basic metabolic panel   better on gabapentin 500     4. Hyperlipidemia, unspecified hyperlipidemia type  J62.8 Basic metabolic panel    CBC with Differential/Platelet    Hepatic function panel    Lipid panel    TSH    TSH    Lipid panel    Hepatic function panel    CBC with  Differential/Platelet    Basic metabolic panel    5. Family history of thyroid disease  Z66.29 Basic metabolic panel    CBC with Differential/Platelet    Hepatic function panel    Lipid panel    TSH    TSH    Lipid panel    Hepatic function panel    CBC with Differential/Platelet    Basic metabolic panel    6. History of partial colectomy  U76.54 Basic metabolic panel    CBC with Differential/Platelet    Hepatic function panel    Lipid panel    TSH    TSH    Lipid panel    Hepatic function panel    CBC with Differential/Platelet    Basic metabolic panel    7. Influenza vaccine needed  Z23 Flu Vaccine QUAD 6+ mos PF IM (Fluarix Quad PF)    Doing well on current regimen will continue although she can try 400 of gabapentin and if works as well would be easier to take a single capsule pill.  She can let us know.  Otherwise we will remain on same medication. Healthy habits attention to bone health resistance training etc. Flu vaccine today we will get pneumonia vaccine at age 4  Return in about 1 year (around 04/06/2023) for depending on results.  Patient Care Team: Leanne Sisler, Standley Brooking, MD as PCP - General Carlean Chan Ofilia Neas, MD as Attending Physician (Gastroenterology) Princess Bruins, MD as Consulting Physician (Obstetrics and Gynecology) Patient Instructions  Good to see you  today  glad you are doing well   Will refill the citalopram and gabapentin . Let us know if 400 mg gaba works as well ( they come in 400 g capsule)  Lab  monitoring . Flu vaccine today . If all ok  can do yearly check up  Standley Brooking. Lakia Gritton M.D.

## 2022-04-05 NOTE — Patient Instructions (Addendum)
Good to see you  today  glad you are doing well   Will refill the citalopram and gabapentin . Let us know if 400 mg gaba works as well ( they come in 400 g capsule)  Lab  monitoring . Flu vaccine today . If all ok  can do yearly check up

## 2022-04-06 LAB — CBC WITH DIFFERENTIAL/PLATELET
Basophils Absolute: 0 10*3/uL (ref 0.0–0.1)
Basophils Relative: 0.8 % (ref 0.0–3.0)
Eosinophils Absolute: 0.1 10*3/uL (ref 0.0–0.7)
Eosinophils Relative: 1.6 % (ref 0.0–5.0)
HCT: 35.9 % — ABNORMAL LOW (ref 36.0–46.0)
Hemoglobin: 12.1 g/dL (ref 12.0–15.0)
Lymphocytes Relative: 27.9 % (ref 12.0–46.0)
Lymphs Abs: 1.5 10*3/uL (ref 0.7–4.0)
MCHC: 33.8 g/dL (ref 30.0–36.0)
MCV: 94.3 fl (ref 78.0–100.0)
Monocytes Absolute: 0.3 10*3/uL (ref 0.1–1.0)
Monocytes Relative: 6.5 % (ref 3.0–12.0)
Neutro Abs: 3.4 10*3/uL (ref 1.4–7.7)
Neutrophils Relative %: 63.2 % (ref 43.0–77.0)
Platelets: 191 10*3/uL (ref 150.0–400.0)
RBC: 3.81 Mil/uL — ABNORMAL LOW (ref 3.87–5.11)
RDW: 13.1 % (ref 11.5–15.5)
WBC: 5.4 10*3/uL (ref 4.0–10.5)

## 2022-04-06 LAB — BASIC METABOLIC PANEL
BUN: 17 mg/dL (ref 6–23)
CO2: 30 mEq/L (ref 19–32)
Calcium: 9.1 mg/dL (ref 8.4–10.5)
Chloride: 101 mEq/L (ref 96–112)
Creatinine, Ser: 0.87 mg/dL (ref 0.40–1.20)
GFR: 71.83 mL/min (ref >60.00)
Glucose, Bld: 76 mg/dL (ref 70–99)
Potassium: 4 mEq/L (ref 3.5–5.1)
Sodium: 139 mEq/L (ref 135–145)

## 2022-04-06 LAB — HEPATIC FUNCTION PANEL
ALT: 11 U/L (ref 0–35)
AST: 21 U/L (ref 0–37)
Albumin: 4.5 g/dL (ref 3.5–5.2)
Alkaline Phosphatase: 52 U/L (ref 39–117)
Bilirubin, Direct: 0.1 mg/dL (ref 0.0–0.3)
Total Bilirubin: 0.5 mg/dL (ref 0.2–1.2)
Total Protein: 7 g/dL (ref 6.0–8.3)

## 2022-04-06 LAB — LIPID PANEL
Cholesterol: 198 mg/dL (ref 0–200)
HDL: 70.4 mg/dL (ref >39.00)
LDL Cholesterol: 105 mg/dL — ABNORMAL HIGH (ref 0–99)
NonHDL: 127.95
Total CHOL/HDL Ratio: 3
Triglycerides: 113 mg/dL (ref 0.0–149.0)
VLDL: 22.6 mg/dL (ref 0.0–40.0)

## 2022-04-06 LAB — TSH: TSH: 4.64 u[IU]/mL (ref 0.35–5.50)

## 2022-04-09 NOTE — Progress Notes (Signed)
Blood results stable or in range  follow yearly or as needed

## 2022-04-19 ENCOUNTER — Other Ambulatory Visit: Payer: Self-pay | Admitting: Internal Medicine

## 2022-05-05 ENCOUNTER — Other Ambulatory Visit: Payer: Self-pay | Admitting: Internal Medicine

## 2022-08-01 DIAGNOSIS — M79651 Pain in right thigh: Secondary | ICD-10-CM | POA: Diagnosis not present

## 2022-09-19 ENCOUNTER — Other Ambulatory Visit: Payer: Self-pay | Admitting: Obstetrics & Gynecology

## 2022-09-19 ENCOUNTER — Ambulatory Visit
Admission: RE | Admit: 2022-09-19 | Discharge: 2022-09-19 | Disposition: A | Discharge status: Home / Self Care | Payer: 59 | Source: Ambulatory Visit | Attending: Obstetrics & Gynecology | Admitting: Obstetrics & Gynecology

## 2022-09-19 DIAGNOSIS — Z1231 Encounter for screening mammogram for malignant neoplasm of breast: Secondary | ICD-10-CM

## 2022-11-16 ENCOUNTER — Other Ambulatory Visit (HOSPITAL_COMMUNITY)
Admission: RE | Admit: 2022-11-16 | Discharge: 2022-11-16 | Disposition: A | Discharge status: Home / Self Care | Payer: 59 | Source: Ambulatory Visit | Attending: Obstetrics & Gynecology | Admitting: Obstetrics & Gynecology

## 2022-11-16 ENCOUNTER — Ambulatory Visit (INDEPENDENT_AMBULATORY_CARE_PROVIDER_SITE_OTHER): Payer: 59 | Admitting: Obstetrics & Gynecology

## 2022-11-16 ENCOUNTER — Encounter: Payer: Self-pay | Admitting: Obstetrics & Gynecology

## 2022-11-16 VITALS — BP 106/64 | HR 67 | Ht 64.5 in | Wt 109.0 lb

## 2022-11-16 DIAGNOSIS — Z01419 Encounter for gynecological examination (general) (routine) without abnormal findings: Secondary | ICD-10-CM | POA: Diagnosis not present

## 2022-11-16 DIAGNOSIS — Z780 Asymptomatic menopausal state: Secondary | ICD-10-CM

## 2022-11-16 NOTE — Progress Notes (Signed)
Jessica Chan 09-21-1960 [id number]   History:    62 y.o. G2P2L2 married. Husband retired 06/2019.  Son 75 yo with autism at home.  Oldest son in [place].   RP:  Established patient presenting for annual gyn exam    HPI: Postmenopause, well on no hormone replacement therapy. No postmenopausal bleeding.  No pelvic pain.  Sexually active, using Coconut oil.  Pap Neg 06/2020.  No significant h/o abnormal Pap. Pap reflex today.  Urine normal, no SUI.  Bowel movements normal. Breasts normal. Mammo Neg 08/2022. Body mass index 18.42.  Exercising on-off and healthy nutrition.  BD normal 2016.  Colono 04/2019.  Health labs with family physician, Dr Fabian Sharp.   Past medical history,surgical history, family history and social history were all reviewed and documented in the EPIC chart.  Gynecologic History Patient's last menstrual period was 07/25/2011.  Obstetric History OB History  Gravida Para Term Preterm AB Living  SAB IAB Ectopic Multiple Live Births          2    # Outcome Date GA Lbr Len/2nd Weight Sex Delivery Anes PTL Lv  4 Term           3 Term           2 Para     M Vag-Spont     1 Para     M Vag-Spont        ROS: A ROS was performed and pertinent positives and negatives are included in the history. GENERAL: No fevers or chills. HEENT: No change in vision, no earache, sore throat or sinus congestion. NECK: No pain or stiffness. CARDIOVASCULAR: No chest pain or pressure. No palpitations. PULMONARY: No shortness of breath, cough or wheeze. GASTROINTESTINAL: No abdominal pain, nausea, vomiting or diarrhea, melena or bright red blood per rectum. GENITOURINARY: No urinary frequency, urgency, hesitancy or dysuria. MUSCULOSKELETAL: No joint or muscle pain, no back pain, no recent trauma. DERMATOLOGIC: No rash, no itching, no lesions. ENDOCRINE: No polyuria, polydipsia, no heat or cold intolerance. No recent change in weight. HEMATOLOGICAL: No anemia or easy bruising or  bleeding. NEUROLOGIC: No headache, seizures, numbness, tingling or weakness. PSYCHIATRIC: No depression, no loss of interest in normal activity or change in sleep pattern.     Exam:   BP 106/64   Pulse 67   Ht 5' 4.5" (1.638 m)   Wt 109 lb (49.4 kg)   LMP 07/25/2011 Comment: sexually active  SpO2 98%   BMI 18.42 kg/m   Body mass index is 18.42 kg/m.  General appearance : Well developed well nourished female. No acute distress HEENT: Eyes: no retinal hemorrhage or exudates,  Neck supple, trachea midline, no carotid bruits, no thyroidmegaly Lungs: Clear to auscultation, no rhonchi or wheezes, or rib retractions  Heart: Regular rate and rhythm, no murmurs or gallops Breast:Examined in sitting and supine position were symmetrical in appearance, no palpable masses or tenderness,  no skin retraction, no nipple inversion, no nipple discharge, no skin discoloration, no axillary or supraclavicular lymphadenopathy Abdomen: no palpable masses or tenderness, no rebound or guarding Extremities: no edema or skin discoloration or tenderness  Pelvic: Vulva: Normal             Vagina: No gross lesions or discharge  Cervix: No gross lesions or discharge.  Pap reflex done.  Uterus  AV, normal size, shape and consistency, non-tender and mobile  Adnexa  Without masses or tenderness  Anus:  Normal   Assessment/Plan:  62 y.o. female for annual exam   1. Encounter for routine gynecological examination with Papanicolaou smear of cervix Postmenopause, well on no hormone replacement therapy. No postmenopausal bleeding.  No pelvic pain.  Sexually active, using Coconut oil.  Pap Neg 06/2020.  No significant h/o abnormal Pap. Pap reflex today.  Urine normal, no SUI.  Bowel movements normal. Breasts normal. Mammo Neg 08/2022. Body mass index 18.42.  Exercising on-off and healthy nutrition.  BD normal 2016.  Colono 04/2019.  Health labs with family physician, Dr Fabian Sharp.  - Cytology - PAP( [place])  2.  Postmenopausal Postmenopause, well on no hormone replacement therapy. No postmenopausal bleeding.  No pelvic pain.  Sexually active, using Coconut oil.   Other orders - citalopram (CELEXA) 20 MG tablet; Take 1 tablet by mouth 2 (two) times daily. - MELATONIN PO; Take by mouth. gummy   Genia Del MD, 2:40 PM

## 2022-11-20 LAB — CYTOLOGY - PAP: Diagnosis: NEGATIVE

## 2022-12-06 ENCOUNTER — Telehealth: Payer: 59 | Admitting: Physician Assistant

## 2022-12-06 DIAGNOSIS — L255 Unspecified contact dermatitis due to plants, except food: Secondary | ICD-10-CM | POA: Diagnosis not present

## 2022-12-06 MED ORDER — PREDNISONE 10 MG PO TABS: ORAL_TABLET | ORAL | 0 refills | Status: DC

## 2022-12-06 NOTE — Progress Notes (Signed)
E-Visit for Poison Ivy  We are sorry that you are not feeing well.  Here is how we plan to help!  Based on what you have shared with me it looks like you have had an allergic reaction to the oily resin from a group of plants.  This resin is very sticky, so it easily attaches to your skin, clothing, tools equipment, and pet's fur.    This blistering rash is often called poison ivy rash although it can come from contact with the leaves, stems and roots of poison ivy, poison oak and poison sumac.  The oily resin contains urushiol (u-ROO-she-ol) that produces a skin rash on exposed skin.  The severity of the rash depends on the amount of urushiol that gets on your skin.  A section of skin with more urushiol on it may develop a rash sooner.  The rash usually develops 12-48 hours after exposure and can last two to three weeks.  Your skin must come in direct contact with the plant's oil to be affected.  Blister fluid doesn't spread the rash.  However, if you come into contact with a piece of clothing or pet fur that has urushiol on it, the rash may spread out.  You can also transfer the oil to other parts of your body with your fingers.  Often the rash looks like a straight line because of the way the plant brushes against your skin.  Since your rash is widespread or has resulted in a large number of blisters, I have prescribed an oral corticosteroid.  Please follow these recommendations:  I have sent a prednisone dose pack to your chosen pharmacy. Be sure to follow the instructions carefully and complete the entire prescription. You may use Benadryl or Caladryl topical lotions to sooth the itch and remember cool, not hot, showers and baths can help relieve the itching!  Place cool, wet compresses on the affected area for 15-30 minutes several times a day.  You may also take oral antihistamines, such as diphenhydramine (Benadryl, others), which may also help you sleep better.  Watch your skin for any purulent  (pus) drainage or red streaking from the site.  If this occurs, contact your provider.  You may require an antibiotic for a skin infection.  Make sure that the clothes you were wearing as well as any towels or sheets that may have come in contact with the oil (urushiol) are washed in detergent and hot water.       I have developed the following plan to treat your condition I am prescribing a two week course of steroids (37 tablets of 10 mg prednisone).  Days 1-4 take 4 tablets (40 mg) daily  Days 5-8 take 3 tablets (30 mg) daily, Days 9-11 take 2 tablets (20 mg) daily, Days 12-14 take 1 tablet (10 mg) daily.    What can you do to prevent this rash?  Avoid the plants.  Learn how to identify poison ivy, poison oak and poison sumac in all seasons.  When hiking or engaging in other activities that might expose you to these plants, try to stay on cleared pathways.  If camping, make sure you pitch your tent in an area free of these plants.  Keep pets from running through wooded areas so that urushiol doesn't accidentally stick to their fur, which you may touch.  Remove or kill the plants.  In your yard, you can get rid of poison ivy by applying an herbicide or pulling it out of   the ground, including the roots, while wearing heavy gloves.  Afterward remove the gloves and thoroughly wash them and your hands.  Don't burn poison ivy or related plants because the urushiol can be carried by smoke.  Wear protective clothing.  If needed, protect your skin by wearing socks, boots, pants, long sleeves and vinyl gloves.  Wash your skin right away.  Washing off the oil with soap and water within 30 minutes of exposure may reduce your chances of getting a poison ivy rash.  Even washing after an hour or so can help reduce the severity of the rash.  If you walk through some poison ivy and then later touch your shoes, you may get some urushiol on your hands, which may then transfer to your face or body by touching or  rubbing.  If the contaminated object isn't cleaned, the urushiol on it can still cause a skin reaction years later.    Be careful not to reuse towels after you have washed your skin.  Also carefully wash clothing in detergent and hot water to remove all traces of the oil.  Handle contaminated clothing carefully so you don't transfer the urushiol to yourself, furniture, rugs or appliances.  Remember that pets can carry the oil on their fur and paws.  If you think your pet may be contaminated with urushiol, put on some long rubber gloves and give your pet a bath.  Finally, be careful not to burn these plants as the smoke can contain traces of the oil.  Inhaling the smoke may result in difficulty breathing. If that occurred you should see a physician as soon as possible.  See your doctor right away if:  The reaction is severe or widespread You inhaled the smoke from burning poison ivy and are having difficulty breathing Your skin continues to swell The rash affects your eyes, mouth or genitals Blisters are oozing pus You develop a fever greater than 100 F (37.8 C) The rash doesn't get better within a few weeks.  If you scratch the poison ivy rash, bacteria under your fingernails may cause the skin to become infected.  See your doctor if pus starts oozing from the blisters.  Treatment generally includes antibiotics.  Poison ivy treatments are usually limited to self-care methods.  And the rash typically goes away on its own in two to three weeks.     If the rash is widespread or results in a large number of blisters, your doctor may prescribe an oral corticosteroid, such as prednisone.  If a bacterial infection has developed at the rash site, your doctor may give you a prescription for an oral antibiotic.  MAKE SURE YOU  Understand these instructions. Will watch your condition. Will get help right away if you are not doing well or get worse.   Thank you for choosing an e-visit.  Your  e-visit answers were reviewed by a board certified advanced clinical practitioner to complete your personal care plan. Depending upon the condition, your plan could have included both over the counter or prescription medications.  Please review your pharmacy choice. Make sure the pharmacy is open so you can pick up prescription now. If there is a problem, you may contact your provider through MyChart messaging and have the prescription routed to another pharmacy.  Your safety is important to us. If you have drug allergies check your prescription carefully.   For the next 24 hours you can use MyChart to ask questions about today's visit, request a non-urgent   call back, or ask for a work or school excuse. You will get an email in the next two days asking about your experience. I hope that your e-visit has been valuable and will speed your recovery.   I have spent 5 minutes in review of e-visit questionnaire, review and updating patient chart, medical decision making and response to patient.   Elisama Thissen M Malone Admire, PA-C  

## 2023-03-05 DIAGNOSIS — M79662 Pain in left lower leg: Secondary | ICD-10-CM | POA: Diagnosis not present

## 2023-03-05 DIAGNOSIS — M7989 Other specified soft tissue disorders: Secondary | ICD-10-CM | POA: Diagnosis not present

## 2023-03-05 DIAGNOSIS — M79661 Pain in right lower leg: Secondary | ICD-10-CM | POA: Diagnosis not present

## 2023-03-05 DIAGNOSIS — M79604 Pain in right leg: Secondary | ICD-10-CM | POA: Diagnosis not present

## 2023-03-05 DIAGNOSIS — I83893 Varicose veins of bilateral lower extremities with other complications: Secondary | ICD-10-CM | POA: Diagnosis not present

## 2023-03-05 DIAGNOSIS — I83813 Varicose veins of bilateral lower extremities with pain: Secondary | ICD-10-CM | POA: Diagnosis not present

## 2023-03-08 ENCOUNTER — Encounter (INDEPENDENT_AMBULATORY_CARE_PROVIDER_SITE_OTHER): Payer: Self-pay

## 2023-03-27 DIAGNOSIS — I872 Venous insufficiency (chronic) (peripheral): Secondary | ICD-10-CM | POA: Diagnosis not present

## 2023-03-28 DIAGNOSIS — Z09 Encounter for follow-up examination after completed treatment for conditions other than malignant neoplasm: Secondary | ICD-10-CM | POA: Diagnosis not present

## 2023-03-28 DIAGNOSIS — I872 Venous insufficiency (chronic) (peripheral): Secondary | ICD-10-CM | POA: Diagnosis not present

## 2023-03-29 DIAGNOSIS — Z09 Encounter for follow-up examination after completed treatment for conditions other than malignant neoplasm: Secondary | ICD-10-CM | POA: Diagnosis not present

## 2023-03-29 DIAGNOSIS — M79662 Pain in left lower leg: Secondary | ICD-10-CM | POA: Diagnosis not present

## 2023-03-29 DIAGNOSIS — I83892 Varicose veins of left lower extremities with other complications: Secondary | ICD-10-CM | POA: Diagnosis not present

## 2023-04-09 ENCOUNTER — Other Ambulatory Visit: Payer: Self-pay | Admitting: Internal Medicine

## 2023-04-17 ENCOUNTER — Telehealth: Payer: Self-pay

## 2023-04-17 NOTE — Progress Notes (Unsigned)
No chief complaint on file.   HPI: Patient  Jessica Chan  62 y.o. comes in today for Preventive Health Care visit   Health Maintenance  Topic Date Due   HIV Screening  Never done   COVID-19 Vaccine (3 - Moderna risk series) 07/03/2020   INFLUENZA VACCINE  02/22/2023   MAMMOGRAM  09/19/2024   Cervical Cancer Screening (HPV/Pap Cotest)  11/15/2025   DTaP/Tdap/Td (3 - Td or Tdap) 10/07/2027   Colonoscopy  05/11/2029   Hepatitis C Screening  Completed   Zoster Vaccines- Shingrix  Completed   HPV VACCINES  Aged Out   Health Maintenance Review LIFESTYLE:  Exercise:   Tobacco/ETS: Alcohol:  Sugar beverages: Sleep: Drug use: no HH of  Work:    ROS:  GEN/ HEENT: No fever, significant weight changes sweats headaches vision problems hearing changes, CV/ PULM; No chest pain shortness of breath cough, syncope,edema  change in exercise tolerance. GI /GU: No adominal pain, vomiting, change in bowel habits. No blood in the stool. No significant GU symptoms. SKIN/HEME: ,no acute skin rashes suspicious lesions or bleeding. No lymphadenopathy, nodules, masses.  NEURO/ PSYCH:  No neurologic signs such as weakness numbness. No depression anxiety. IMM/ Allergy: No unusual infections.  Allergy .   REST of 12 system review negative except as per HPI   Past Medical History:  Diagnosis Date   Allergic rhinitis    Allergy    Anxiety    Anxiety disorder    BLEPHARITIS 05/27/2007   Qualifier: Diagnosis of  By: Lawernce Ion, CMA (AAMA), Carollee Herter S    Depression    Pt. denies   Diverticulitis    Diverticulitis of colon - recurrent 10/02/2013   Diverticulosis    ESOPHAGEAL STRICTURE 11/15/2009   Qualifier: Diagnosis of  By: Monica Becton PA-c, Amy S    GERD (gastroesophageal reflux disease)    Hemangioma of liver    incidental finding-large, right and left lobes of liver    Hiatal hernia    IBS (irritable bowel syndrome)    PONV (postoperative nausea and vomiting)    SEVERE NAUSEA &  VOMITING   Sinusitis, acute maxillary 07/29/2012   right  hx of sinus surgery  fu if needed recurrecne     Wears glasses 02/02/2021    Past Surgical History:  Procedure Laterality Date   adnoidectomy     COLONOSCOPY     DILATATION & CURETTAGE/HYSTEROSCOPY WITH MYOSURE N/A 05/23/2016   Procedure: DILATATION & CURETTAGE/HYSTEROSCOPY WITH MYOSURE;  Surgeon: Ok Edwards, MD;  Location: [facility];  Service: Gynecology;  Laterality: N/A;  Request to post this for 12:30pm. That will leave room for a second major case for [place]. If we do not get a 2nd case in the next week I will move it up to follow. Thanks!  Needs one hour.  REQUEST FOR TINA RN AND AUDREY CST   endoscopy with dialation     KNEE ARTHROSCOPY WITH MEDIAL MENISECTOMY Right 02/08/2021   Procedure: KNEE ARTHROSCOPY WITH MEDIAL AND LATERAL MENISECTOMIES;  Surgeon: Teryl Lucy, MD;  Location: [facility] [place];  Service: Orthopedics;  Laterality: Right;   NASAL SINUS SURGERY     PROCTOSCOPY N/A 07/02/2014   Procedure: PROCTOSCOPY;  Surgeon: Karie Soda, MD;  Location: [facility];  Service: General;  Laterality: N/A;   ROBOT ASSISTED LAPAROSCOPIC PARTIAL COLECTOMY     TONSILLECTOMY     ulnar nerve right arm     UPPER GASTROINTESTINAL ENDOSCOPY      Family History  Problem Relation Age of Onset   Lung cancer Mother    Autism Son    Colon cancer Maternal Uncle    Heart disease Father    Lung cancer Father    Breast cancer Maternal Grandmother    Esophageal cancer Neg Hx    Rectal cancer Neg Hx    Stomach cancer Neg Hx     Social History   Socioeconomic History   Marital status: Married    Spouse name: Not on file   Number of children: 2   Years of education: Not on file   Highest education level: Not on file  Occupational History   Occupation: Conservator, museum/gallery: UNEMPLOYED  Tobacco Use   Smoking status: Never   Smokeless tobacco: Never  Vaping Use   Vaping status: Never Used  Substance and Sexual  Activity   Alcohol use: Yes    Comment: Social Drinker   Drug use: No   Sexual activity: Yes    Partners: Male    Birth control/protection: Post-menopausal    Comment: 1st intercourse- 17, partners- >5  Other Topics Concern   Not on file  Social History Narrative   Married HHof 4 one sone autistic   Husband had prostate cancer in last few years   Some exercise  Runs    No tobacco   Social Determinants of Health   Financial Resource Strain: Not on file  Food Insecurity: Not on file  Transportation Needs: Not on file  Physical Activity: Not on file  Stress: Not on file  Social Connections: Not on file    Outpatient Medications Prior to Visit  Medication Sig Dispense Refill   ALPRAZolam (XANAX) 0.5 MG tablet Take 1-2 tablets (0.5-1 mg total) by mouth 2 (two) times daily as needed for anxiety. 20 tablet 0   benzonatate (TESSALON) 100 MG capsule Take 1 capsule (100 mg total) by mouth 3 (three) times daily as needed for cough. 30 capsule 0   citalopram (CELEXA) 20 MG tablet Take 1 tablet by mouth 2 (two) times daily.     gabapentin (NEURONTIN) 100 MG capsule TAKE 5 CAPSULES(500 MG) BY MOUTH AT BEDTIME 150 capsule 11   MELATONIN PO Take by mouth. gummy     Multiple Vitamin (MULTIVITAMIN) tablet Take 1 tablet by mouth every morning.      Omega-3 Fatty Acids (FISH OIL PO) Take 1 capsule by mouth daily.     predniSONE (DELTASONE) 10 MG tablet Days 1-4 take 4 tablets (40 mg) daily  Days 5-8 take 3 tablets (30 mg) daily, Days 9-11 take 2 tablets (20 mg) daily, Days 12-14 take 1 tablet (10 mg) daily. 37 tablet 0   tretinoin (RETIN-A) 0.025 % cream apply to affected area at bedtime TO DRY SKIN FOR ACNE  0   zolpidem (AMBIEN) 10 MG tablet TAKE 1 TABLET BY MOUTH AT BEDTIME AS NEEDED FOR SLEEP 30 tablet 3   No facility-administered medications prior to visit.     EXAM:  LMP 07/25/2011 Comment: sexually active  There is no height or weight on file to calculate BMI. Wt Readings from Last  3 Encounters:  11/16/22 109 lb (49.4 kg)  04/05/22 107 lb (48.5 kg)  07/12/21 108 lb (49 kg)    Physical Exam: Vital signs reviewed ZOX:WRUE is a well-developed well-nourished alert cooperative    who appearsr stated age in no acute distress.  HEENT: normocephalic atraumatic , Eyes: PERRL EOM's full, conjunctiva clear, Nares: paten,t no deformity discharge or tenderness.,  Ears: no deformity EAC's clear TMs with normal landmarks. Mouth: clear OP, no lesions, edema.  Moist mucous membranes. Dentition in adequate repair. NECK: supple without masses, thyromegaly or bruits. CHEST/PULM:  Clear to auscultation and percussion breath sounds equal no wheeze , rales or rhonchi. No chest wall deformities or tenderness. Breast: normal by inspection . No dimpling, discharge, masses, tenderness or discharge . CV: PMI is nondisplaced, S1 S2 no gallops, murmurs, rubs. Peripheral pulses are full without delay.No JVD .  ABDOMEN: Bowel sounds normal nontender  No guard or rebound, no hepato splenomegal no CVA tenderness.  No hernia. Extremtities:  No clubbing cyanosis or edema, no acute joint swelling or redness no focal atrophy NEURO:  Oriented x3, cranial nerves 3-12 appear to be intact, no obvious focal weakness,gait within normal limits no abnormal reflexes or asymmetrical SKIN: No acute rashes normal turgor, color, no bruising or petechiae. PSYCH: Oriented, good eye contact, no obvious depression anxiety, cognition and judgment appear normal. LN: no cervical axillary inguinal adenopathy  Lab Results  Component Value Date   WBC 5.4 04/05/2022   HGB 12.1 04/05/2022   HCT 35.9 (L) 04/05/2022   PLT 191.0 04/05/2022   GLUCOSE 76 04/05/2022   CHOL 198 04/05/2022   TRIG 113.0 04/05/2022   HDL 70.40 04/05/2022   LDLCALC 105 (H) 04/05/2022   ALT 11 04/05/2022   AST 21 04/05/2022   NA 139 04/05/2022   K 4.0 04/05/2022   CL 101 04/05/2022   CREATININE 0.87 04/05/2022   BUN 17 04/05/2022   CO2 30  04/05/2022   TSH 4.64 04/05/2022   HGBA1C 5.1 12/25/2018    BP Readings from Last 3 Encounters:  11/16/22 106/64  04/05/22 118/70  07/12/21 114/70    Lab plan  reviewed with patient   ASSESSMENT AND PLAN:  Discussed the following assessment and plan:    ICD-10-CM   1. Visit for preventive health examination  Z00.00     2. Medication management  Z79.899     3. Hyperlipidemia, unspecified hyperlipidemia type  E78.5     4. History of partial colectomy  Z90.49     5. Family history of thyroid disease  Z83.49      No follow-ups on file.  Patient Care Team: Arena Lindahl, Neta Mends, MD as PCP - General Leone Payor Maryjean Morn, MD as Attending Physician (Gastroenterology) Genia Del, MD as Consulting Physician (Obstetrics and Gynecology) There are no Patient Instructions on file for this visit.  Neta Mends. Gaylia Kassel M.D.

## 2023-04-17 NOTE — Telephone Encounter (Signed)
Spke to pt and inform her that we won't have in house lab tomorrow. But can send to [facility] if fasting or can do lab at another day.

## 2023-04-18 ENCOUNTER — Ambulatory Visit (INDEPENDENT_AMBULATORY_CARE_PROVIDER_SITE_OTHER): Payer: 59 | Admitting: Internal Medicine

## 2023-04-18 ENCOUNTER — Encounter: Payer: Self-pay | Admitting: Internal Medicine

## 2023-04-18 VITALS — BP 110/76 | HR 70 | Temp 98.0°F | Ht 64.9 in | Wt 108.2 lb

## 2023-04-18 DIAGNOSIS — Z9049 Acquired absence of other specified parts of digestive tract: Secondary | ICD-10-CM

## 2023-04-18 DIAGNOSIS — Z823 Family history of stroke: Secondary | ICD-10-CM

## 2023-04-18 DIAGNOSIS — E785 Hyperlipidemia, unspecified: Secondary | ICD-10-CM | POA: Diagnosis not present

## 2023-04-18 DIAGNOSIS — Z0001 Encounter for general adult medical examination with abnormal findings: Secondary | ICD-10-CM | POA: Diagnosis not present

## 2023-04-18 DIAGNOSIS — Z23 Encounter for immunization: Secondary | ICD-10-CM

## 2023-04-18 DIAGNOSIS — Z0000 Encounter for general adult medical examination without abnormal findings: Secondary | ICD-10-CM

## 2023-04-18 DIAGNOSIS — S299XXA Unspecified injury of thorax, initial encounter: Secondary | ICD-10-CM | POA: Diagnosis not present

## 2023-04-18 DIAGNOSIS — Z79899 Other long term (current) drug therapy: Secondary | ICD-10-CM | POA: Diagnosis not present

## 2023-04-18 DIAGNOSIS — Z8349 Family history of other endocrine, nutritional and metabolic diseases: Secondary | ICD-10-CM

## 2023-04-18 NOTE — Patient Instructions (Signed)
Good to see you today . Future labs at [address]  Can also get rib x ray also as discussed . Flu vaccine today .

## 2023-04-30 ENCOUNTER — Other Ambulatory Visit (INDEPENDENT_AMBULATORY_CARE_PROVIDER_SITE_OTHER): Payer: 59

## 2023-04-30 DIAGNOSIS — Z9049 Acquired absence of other specified parts of digestive tract: Secondary | ICD-10-CM

## 2023-04-30 DIAGNOSIS — S299XXA Unspecified injury of thorax, initial encounter: Secondary | ICD-10-CM | POA: Diagnosis not present

## 2023-04-30 DIAGNOSIS — E785 Hyperlipidemia, unspecified: Secondary | ICD-10-CM

## 2023-04-30 DIAGNOSIS — Z79899 Other long term (current) drug therapy: Secondary | ICD-10-CM | POA: Diagnosis not present

## 2023-04-30 DIAGNOSIS — Z0000 Encounter for general adult medical examination without abnormal findings: Secondary | ICD-10-CM | POA: Diagnosis not present

## 2023-04-30 DIAGNOSIS — Z823 Family history of stroke: Secondary | ICD-10-CM

## 2023-04-30 LAB — CBC WITH DIFFERENTIAL/PLATELET
Basophils Absolute: 0 10*3/uL (ref 0.0–0.1)
Basophils Relative: 0.4 % (ref 0.0–3.0)
Eosinophils Absolute: 0.1 10*3/uL (ref 0.0–0.7)
Eosinophils Relative: 2 % (ref 0.0–5.0)
HCT: 37.9 % (ref 36.0–46.0)
Hemoglobin: 12.4 g/dL (ref 12.0–15.0)
Lymphocytes Relative: 31.3 % (ref 12.0–46.0)
Lymphs Abs: 1.4 10*3/uL (ref 0.7–4.0)
MCHC: 32.9 g/dL (ref 30.0–36.0)
MCV: 94.9 fL (ref 78.0–100.0)
Monocytes Absolute: 0.4 10*3/uL (ref 0.1–1.0)
Monocytes Relative: 8.4 % (ref 3.0–12.0)
Neutro Abs: 2.6 10*3/uL (ref 1.4–7.7)
Neutrophils Relative %: 57.9 % (ref 43.0–77.0)
Platelets: 210 10*3/uL (ref 150.0–400.0)
RBC: 3.99 Mil/uL (ref 3.87–5.11)
RDW: 13.1 % (ref 11.5–15.5)
WBC: 4.5 10*3/uL (ref 4.0–10.5)

## 2023-04-30 LAB — LIPID PANEL
Cholesterol: 201 mg/dL — ABNORMAL HIGH (ref 0–200)
HDL: 68.7 mg/dL (ref >39.00)
LDL Cholesterol: 112 mg/dL — ABNORMAL HIGH (ref 0–99)
NonHDL: 132.72
Total CHOL/HDL Ratio: 3
Triglycerides: 103 mg/dL (ref 0.0–149.0)
VLDL: 20.6 mg/dL (ref 0.0–40.0)

## 2023-04-30 LAB — TSH: TSH: 6.06 u[IU]/mL — ABNORMAL HIGH (ref 0.35–5.50)

## 2023-04-30 LAB — COMPREHENSIVE METABOLIC PANEL
ALT: 9 U/L (ref 0–35)
AST: 17 U/L (ref 0–37)
Albumin: 4.4 g/dL (ref 3.5–5.2)
Alkaline Phosphatase: 58 U/L (ref 39–117)
BUN: 17 mg/dL (ref 6–23)
CO2: 31 meq/L (ref 19–32)
Calcium: 8.9 mg/dL (ref 8.4–10.5)
Chloride: 103 meq/L (ref 96–112)
Creatinine, Ser: 0.86 mg/dL (ref 0.40–1.20)
GFR: 72.29 mL/min (ref >60.00)
Glucose, Bld: 86 mg/dL (ref 70–99)
Potassium: 4.1 meq/L (ref 3.5–5.1)
Sodium: 140 meq/L (ref 135–145)
Total Bilirubin: 0.6 mg/dL (ref 0.2–1.2)
Total Protein: 6.6 g/dL (ref 6.0–8.3)

## 2023-05-02 LAB — LIPOPROTEIN A (LPA): Lipoprotein (a): 14 nmol/L (ref <75)

## 2023-05-06 ENCOUNTER — Other Ambulatory Visit: Payer: Self-pay | Admitting: Internal Medicine

## 2023-05-23 ENCOUNTER — Other Ambulatory Visit: Payer: Self-pay | Admitting: Family

## 2023-06-29 ENCOUNTER — Encounter: Payer: Self-pay | Admitting: Internal Medicine

## 2023-06-29 ENCOUNTER — Other Ambulatory Visit: Payer: Self-pay | Admitting: Family

## 2023-06-29 MED ORDER — ALPRAZOLAM 0.5 MG PO TABS: 0.5000 mg | ORAL_TABLET | Freq: Two times a day (BID) | ORAL | 0 refills | Status: AC | PRN

## 2023-08-02 ENCOUNTER — Encounter: Payer: Self-pay | Admitting: Emergency Medicine

## 2023-08-02 ENCOUNTER — Ambulatory Visit
Admission: EM | Admit: 2023-08-02 | Discharge: 2023-08-02 | Disposition: A | Discharge status: Home / Self Care | Payer: 59

## 2023-08-02 DIAGNOSIS — S0181XA Laceration without foreign body of other part of head, initial encounter: Secondary | ICD-10-CM

## 2023-08-02 DIAGNOSIS — W19XXXA Unspecified fall, initial encounter: Secondary | ICD-10-CM

## 2023-08-02 NOTE — ED Triage Notes (Signed)
 Pt reports a fall this morning (1hr ago) while walking her dog she slipped on her steps. Hit her lower lip on edge of her steps with fall. No other complaints. Bottom lip is swollen with small amount of blood. Reports some numbness to area. Reports she initially had headache and nausea for after fall.

## 2023-08-02 NOTE — ED Provider Notes (Signed)
 [facility]    CSN: [id number] Arrival date & time: 08/02/23  0906      History   Chief Complaint Chief Complaint  Patient presents with   Fall   Lip Laceration    HPI Jessica Chan is a 63 y.o. female.   Patient here today for evaluation of a laceration to her face just below her left lower lip that occurred when she fell on her steps this morning.  She reports that she slipped while walking her dog and hit her mouth on the steps.  She did have some headache and nausea for about 10 minutes after fall but none since.  She denies any vomiting.  She has not any lingering headaches, and denies any dizziness or other concerns. Last tetanus vaccination 09/2017  The history is provided by the patient.  Fall Pertinent negatives include no abdominal pain and no headaches.    Past Medical History:  Diagnosis Date   Allergic rhinitis    Allergy    Anxiety    Anxiety disorder    BLEPHARITIS 05/27/2007   Qualifier: Diagnosis of  By: Laurice, CMA (AAMA), Clotilda S    Depression    Pt. denies   Diverticulitis    Diverticulitis of colon - recurrent 10/02/2013   Diverticulosis    ESOPHAGEAL STRICTURE 11/15/2009   Qualifier: Diagnosis of  By: Ever PA-c, Amy S    GERD (gastroesophageal reflux disease)    Hemangioma of liver    incidental finding-large, right and left lobes of liver    Hiatal hernia    IBS (irritable bowel syndrome)    PONV (postoperative nausea and vomiting)    SEVERE NAUSEA & VOMITING   Sinusitis, acute maxillary 07/29/2012   right  hx of sinus surgery  fu if needed recurrecne     Wears glasses 02/02/2021    Patient Active Problem List   Diagnosis Date Noted   S/P medial meniscectomy of right knee 02/08/2021   History of partial colectomy 11/11/2018   Family history of thyroid  disease 10/04/2016   Abnormal Papanicolaou smear of cervix with positive human papilloma virus (HPV) test 05/03/2016   Situational anxiety 05/20/2014   Elevated  cholesterol 05/20/2014   Menopausal and perimenopausal disorder 11/27/2013   Medication management 11/27/2013   Sciatica 11/27/2013   Adjustment reaction with anxiety and depression 12/28/2011   Preventive measure 12/28/2011   BREAST MASS, RIGHT 07/21/2010   HEMANGIOMA, HEPATIC 12/22/2008   ADJUSTMENT DISORDER WITH DEPRESSED MOOD 12/22/2008   SLEEP DISORDER/DISTURBANCE 12/22/2008   Allergic rhinitis 05/27/2007   GERD 05/27/2007   HIATAL HERNIA 05/27/2007   NEPHROLITHIASIS, HX OF 05/27/2007    Past Surgical History:  Procedure Laterality Date   adnoidectomy     COLONOSCOPY     DILATATION & CURETTAGE/HYSTEROSCOPY WITH MYOSURE N/A 05/23/2016   Procedure: DILATATION & CURETTAGE/HYSTEROSCOPY WITH MYOSURE;  Surgeon: Curlee VEAR Guan, MD;  Location: [facility];  Service: Gynecology;  Laterality: N/A;  Request to post this for 12:30pm. That will leave room for a second major case for us . If we do not get a 2nd case in the next week I will move it up to follow. Thanks!  Needs one hour.  REQUEST FOR TINA RN AND AUDREY CST   endoscopy with dialation     KNEE ARTHROSCOPY WITH MEDIAL MENISECTOMY Right 02/08/2021   Procedure: KNEE ARTHROSCOPY WITH MEDIAL AND LATERAL MENISECTOMIES;  Surgeon: Josefina Chew, MD;  Location: [facility];  Service: Orthopedics;  Laterality: Right;  NASAL SINUS SURGERY     PROCTOSCOPY N/A 07/02/2014   Procedure: PROCTOSCOPY;  Surgeon: Elspeth Schultze, MD;  Location: [facility];  Service: General;  Laterality: N/A;   ROBOT ASSISTED LAPAROSCOPIC PARTIAL COLECTOMY     TONSILLECTOMY     ulnar nerve right arm     UPPER GASTROINTESTINAL ENDOSCOPY      OB History     Gravida  4   Para  4   Term  2   Preterm      AB      Living  2      SAB      IAB      Ectopic      Multiple      Live Births  2            Home Medications    Prior to Admission medications   Medication Sig Start Date End Date Taking? Authorizing Provider  citalopram   (CELEXA ) 20 MG tablet TAKE 1 TABLET BY MOUTH TWICE A DAY 05/08/23  Yes Webb, Padonda B, FNP  gabapentin  (NEURONTIN ) 100 MG capsule TAKE 5 CAPSULES(500 MG) BY MOUTH AT BEDTIME 04/10/23  Yes Panosh, Wanda K, MD  MELATONIN PO Take by mouth. gummy   Yes [provider]  Multiple Vitamin (MULTIVITAMIN) tablet Take 1 tablet by mouth every morning.    Yes [provider]  Omega-3 Fatty Acids (FISH OIL PO) Take 1 capsule by mouth daily.   Yes [provider]  tretinoin (RETIN-A) 0.025 % cream apply to affected area at bedtime TO DRY SKIN FOR ACNE 03/30/15  Yes [provider]  zolpidem  (AMBIEN ) 10 MG tablet TAKE 1 TABLET BY MOUTH AT BEDTIME AS NEEDED FOR SLEEP 05/07/20  Yes Lavoie, Marie-Lyne, MD  ALPRAZolam  (XANAX ) 0.5 MG tablet Take 1-2 tablets (0.5-1 mg total) by mouth 2 (two) times daily as needed for anxiety. 06/29/23   Douglass Kenney NOVAK, FNP    Family History Family History  Problem Relation Age of Onset   Lung cancer Mother    Autism Son    Colon cancer Maternal Uncle    Heart disease Father    Lung cancer Father    Breast cancer Maternal Grandmother    Esophageal cancer Neg Hx    Rectal cancer Neg Hx    Stomach cancer Neg Hx     Social History Social History   Tobacco Use   Smoking status: Never   Smokeless tobacco: Never  Vaping Use   Vaping status: Never Used  Substance Use Topics   Alcohol use: Yes    Comment: Social Drinker   Drug use: No     Allergies   Shellfish allergy, Demerol [meperidine], Amoxicillin-pot clavulanate, Meperidine hcl, and Oxycodone    Review of Systems Review of Systems  Constitutional:  Negative for chills and fever.  Eyes:  Negative for discharge and redness.  Gastrointestinal:  Negative for abdominal pain, nausea and vomiting.  Skin:  Positive for wound. Negative for color change.  Neurological:  Negative for dizziness, light-headedness and headaches.     Physical Exam Triage Vital Signs ED Triage Vitals   Encounter Vitals Group     BP 08/02/23 0922 122/73     Systolic BP Percentile --      Diastolic BP Percentile --      Pulse Rate 08/02/23 0922 61     Resp 08/02/23 0922 16     Temp 08/02/23 0922 97.7 F (36.5 C)     Temp Source 08/02/23 9077  Oral     SpO2 08/02/23 0922 98 %     Weight --      Height --      Head Circumference --      Peak Flow --      Pain Score 08/02/23 0925 2     Pain Loc --      Pain Education --      Exclude from Growth Chart --    No data found.  Updated Vital Signs BP 122/73 (BP Location: Right Arm)   Pulse 61   Temp 97.7 F (36.5 C) (Oral)   Resp 16   LMP 07/25/2011 Comment: sexually active  SpO2 98%   Visual Acuity Right Eye Distance:   Left Eye Distance:   Bilateral Distance:    Right Eye Near:   Left Eye Near:    Bilateral Near:     Physical Exam Vitals and nursing note reviewed.  Constitutional:      General: She is not in acute distress.    Appearance: Normal appearance. She is not ill-appearing.  HENT:     Head: Normocephalic and atraumatic.  Eyes:     Conjunctiva/sclera: Conjunctivae normal.  Cardiovascular:     Rate and Rhythm: Normal rate.  Pulmonary:     Effort: Pulmonary effort is normal. No respiratory distress.  Skin:    Comments: Approx 1 cm laceration below left lower lip- minimal oozing. Not through and through. Bruising noted to left lower lip  Neurological:     Mental Status: She is alert.  Psychiatric:        Mood and Affect: Mood normal.        Behavior: Behavior normal.        Thought Content: Thought content normal.      UC Treatments / Results  Labs (all labs ordered are listed, but only abnormal results are displayed) Labs Reviewed - No data to display  EKG   Radiology No results found.  Procedures Procedures (including critical care time)  Medications Ordered in UC Medications - No data to display  Initial Impression / Assessment and Plan / UC Course  I have reviewed the triage vital  signs and the nursing notes.  Pertinent labs & imaging results that were available during my care of the patient were reviewed by me and considered in my medical decision making (see chart for details).    Laceration repaired in office with dermabond after routine cleaning. Advised follow up with any signs of infection or report to ED with any other headache, nausea, dizziness, lightheadedness. Patient expressed understanding.   Final Clinical Impressions(s) / UC Diagnoses   Final diagnoses:  Fall, initial encounter  Facial laceration, initial encounter   Discharge Instructions   None    ED Prescriptions   None    PDMP not reviewed this encounter.   Billy Asberry FALCON, PA-C 08/02/23 [phone]

## 2023-08-08 ENCOUNTER — Telehealth: Payer: Self-pay | Admitting: Internal Medicine

## 2023-08-08 NOTE — Telephone Encounter (Signed)
.  Pt called [facility] and per pt her gabapentin  (NEURONTIN ) 100 MG capsule  was denied per pharm however I see she has refill. Please advice  [id number] - [place], [place] - [address] Phone: [phone]  Fax: [phone]

## 2023-09-13 ENCOUNTER — Ambulatory Visit: Payer: 59 | Admitting: Family Medicine

## 2023-09-13 ENCOUNTER — Ambulatory Visit: Payer: 59 | Admitting: Internal Medicine

## 2023-09-13 ENCOUNTER — Encounter: Payer: Self-pay | Admitting: Family Medicine

## 2023-09-13 VITALS — BP 110/80 | HR 75 | Temp 98.8°F | Ht 64.9 in | Wt 108.4 lb

## 2023-09-13 DIAGNOSIS — L905 Scar conditions and fibrosis of skin: Secondary | ICD-10-CM

## 2023-09-13 NOTE — Progress Notes (Signed)
Established Patient Office Visit   Subjective  Patient ID: Jessica Chan, female    DOB: [birth year]  Age: 63 y.o. MRN: [id number]  Chief Complaint  Patient presents with   Fall    UC follow-up, 1/9, lip injury, there's a lump on scar and it is still sore, patient has a dermatology appt set up     Patient is a 63 year old female followed by Dr. Fabian Sharp and seen for ongoing concern.  Pt fell face first on 08/02/2023 while going up stairs outside of her home.  Had hands in her pocket.  Seen at [facility] on the same day due to continued bleeding of lip laceration.  Patient states lip was glued together.  Patient now notices a slight scar and a bump on the outside of her lip.  Slight edema on inside of lip that she occasionally bites.  Area is not sore and was bitten.  Slight burning sensation noted with use of facial cleanser.  Denies drainage, erythema, increased warmth, fever.  Patient was requesting referral to dermatology.     Patient Active Problem List   Diagnosis Date Noted   S/P medial meniscectomy of right knee 02/08/2021   History of partial colectomy 11/11/2018   Family history of thyroid disease 10/04/2016   Abnormal Papanicolaou smear of cervix with positive human papilloma virus (HPV) test 05/03/2016   Situational anxiety 05/20/2014   Elevated cholesterol 05/20/2014   Menopausal and perimenopausal disorder 11/27/2013   Medication management 11/27/2013   Sciatica 11/27/2013   Adjustment reaction with anxiety and depression 12/28/2011   Preventive measure 12/28/2011   BREAST MASS, RIGHT 07/21/2010   HEMANGIOMA, HEPATIC 12/22/2008   ADJUSTMENT DISORDER WITH DEPRESSED MOOD 12/22/2008   SLEEP DISORDER/DISTURBANCE 12/22/2008   Allergic rhinitis 05/27/2007   GERD 05/27/2007   HIATAL HERNIA 05/27/2007   NEPHROLITHIASIS, HX OF 05/27/2007   Past Medical History:  Diagnosis Date   Allergic rhinitis    Allergy    Anxiety    Anxiety disorder    BLEPHARITIS 05/27/2007   Qualifier:  Diagnosis of  By: Lawernce Ion, CMA (AAMA), Carollee Herter S    Depression    Pt. denies   Diverticulitis    Diverticulitis of colon - recurrent 10/02/2013   Diverticulosis    ESOPHAGEAL STRICTURE 11/15/2009   Qualifier: Diagnosis of  By: Monica Becton PA-c, Amy S    GERD (gastroesophageal reflux disease)    Hemangioma of liver    incidental finding-large, right and left lobes of liver    Hiatal hernia    IBS (irritable bowel syndrome)    PONV (postoperative nausea and vomiting)    SEVERE NAUSEA & VOMITING   Sinusitis, acute maxillary 07/29/2012   right  hx of sinus surgery  fu if needed recurrecne     Wears glasses 02/02/2021   Past Surgical History:  Procedure Laterality Date   adnoidectomy     COLONOSCOPY     DILATATION & CURETTAGE/HYSTEROSCOPY WITH MYOSURE N/A 05/23/2016   Procedure: DILATATION & CURETTAGE/HYSTEROSCOPY WITH MYOSURE;  Surgeon: Ok Edwards, MD;  Location: [facility];  Service: Gynecology;  Laterality: N/A;  Request to post this for 12:30pm. That will leave room for a second major case for [place]. If we do not get a 2nd case in the next week I will move it up to follow. Thanks!  Needs one hour.  REQUEST FOR TINA RN AND AUDREY CST   endoscopy with dialation     KNEE ARTHROSCOPY WITH MEDIAL MENISECTOMY Right 02/08/2021   Procedure: KNEE  ARTHROSCOPY WITH MEDIAL AND LATERAL MENISECTOMIES;  Surgeon: Teryl Lucy, MD;  Location: [facility];  Service: Orthopedics;  Laterality: Right;   NASAL SINUS SURGERY     PROCTOSCOPY N/A 07/02/2014   Procedure: PROCTOSCOPY;  Surgeon: Karie Soda, MD;  Location: [facility];  Service: General;  Laterality: N/A;   ROBOT ASSISTED LAPAROSCOPIC PARTIAL COLECTOMY     TONSILLECTOMY     ulnar nerve right arm     UPPER GASTROINTESTINAL ENDOSCOPY     Social History   Tobacco Use   Smoking status: Never   Smokeless tobacco: Never  Vaping Use   Vaping status: Never Used  Substance Use Topics   Alcohol use: Yes    Comment: Social Drinker    Drug use: No   Family History  Problem Relation Age of Onset   Lung cancer Mother    Autism Son    Colon cancer Maternal Uncle    Heart disease Father    Lung cancer Father    Breast cancer Maternal Grandmother    Esophageal cancer Neg Hx    Rectal cancer Neg Hx    Stomach cancer Neg Hx    Allergies  Allergen Reactions   Shellfish Allergy Hives, Swelling and Other (See Comments)    Swelling lips and numbness in lips and nose and itching throat after eating shrimp. and also sensitive touching shrimp with burning sensation and pain   Demerol [Meperidine] Hives and Rash   Amoxicillin-Pot Clavulanate Nausea Only    Has patient had a PCN reaction causing immediate rash, facial/tongue/throat swelling, SOB or lightheadedness with hypotension: no Has patient had a PCN reaction causing severe rash involving mucus membranes or skin necrosis: no Has patient had a PCN reaction that required hospitalization no Has patient had a PCN reaction occurring within the last 10 years: maybe? If all of the above answers are "NO", then may proceed with Cephalosporin use.    Meperidine Hcl     Red patches all over body   Oxycodone Nausea Only      ROS Negative unless stated above    Objective:     BP 110/80 (BP Location: Left Arm, Patient Position: Sitting, Cuff Size: Normal)   Pulse 75   Temp 98.8 F (37.1 C) (Oral)   Ht 5' 4.9" (1.648 m)   Wt 108 lb 6.4 oz (49.2 kg)   LMP 07/25/2011 Comment: sexually active  SpO2 99%   BMI 18.09 kg/m  BP Readings from Last 3 Encounters:  09/13/23 110/80  08/02/23 122/73  04/18/23 110/76   Wt Readings from Last 3 Encounters:  09/13/23 108 lb 6.4 oz (49.2 kg)  04/18/23 108 lb 3.2 oz (49.1 kg)  11/16/22 109 lb (49.4 kg)      Physical Exam Constitutional:      General: She is not in acute distress.    Appearance: Normal appearance.  HENT:     Head: Normocephalic and atraumatic.     Nose: Nose normal.     Mouth/Throat:     Mouth: Mucous  membranes are moist.      Comments: Healed laceration of L lateral lower lip with 2 papules at inferior end of scar. Cardiovascular:     Rate and Rhythm: Normal rate.  Pulmonary:     Effort: Pulmonary effort is normal.  Skin:    General: Skin is warm and dry.     Comments: Left lateral lip with subtle mildly hypertrophic scar crossing the vermilion border.  2 firm papules 1 mm  in size at inferior scar. Mild edema on buccal surface of lip.  Neurological:     Mental Status: She is alert and oriented to person, place, and time.    No results found for any visits on 09/13/23.    Assessment & Plan:  Scar of lip  S/p fall on 1/9 causing laceration to lateral lower lip.  Hemostasis achieved with Dermabond at UC.  Laceration healed.  Discussed OTC products that may help reduce appearance of scar.  Advised over time scar may appear reduced in size.  Referral to plastics offered.  Patient declines at this time as would like to think about it.  Follow-up with PCP as needed  Return if symptoms worsen or fail to improve.   Deeann Saint, MD

## 2023-09-24 DIAGNOSIS — D2272 Melanocytic nevi of left lower limb, including hip: Secondary | ICD-10-CM | POA: Diagnosis not present

## 2023-09-24 DIAGNOSIS — L812 Freckles: Secondary | ICD-10-CM | POA: Diagnosis not present

## 2023-09-24 DIAGNOSIS — L910 Hypertrophic scar: Secondary | ICD-10-CM | POA: Diagnosis not present

## 2023-09-24 DIAGNOSIS — L821 Other seborrheic keratosis: Secondary | ICD-10-CM | POA: Diagnosis not present

## 2023-09-24 DIAGNOSIS — D1801 Hemangioma of skin and subcutaneous tissue: Secondary | ICD-10-CM | POA: Diagnosis not present

## 2023-09-24 DIAGNOSIS — D225 Melanocytic nevi of trunk: Secondary | ICD-10-CM | POA: Diagnosis not present

## 2023-11-15 DIAGNOSIS — L910 Hypertrophic scar: Secondary | ICD-10-CM | POA: Diagnosis not present

## 2023-11-16 ENCOUNTER — Other Ambulatory Visit: Payer: Self-pay | Admitting: Internal Medicine

## 2023-11-16 DIAGNOSIS — Z1231 Encounter for screening mammogram for malignant neoplasm of breast: Secondary | ICD-10-CM

## 2023-11-19 ENCOUNTER — Encounter: Payer: Self-pay | Admitting: Internal Medicine

## 2023-11-19 NOTE — Progress Notes (Signed)
 Thyroid  test from October was borderline .This may not be significant  but  I would like you to get a repeat thyroid  tests now 6 months out . Do not have to fast  but please arrange  tsh  free t4     avoid taking any biotin for at least one day before as this can  interfere with results.

## 2023-11-20 ENCOUNTER — Telehealth: Payer: Self-pay | Admitting: Internal Medicine

## 2023-11-20 NOTE — Telephone Encounter (Signed)
 Copied from CRM [id number]. Topic: Appointments - Scheduling Inquiry for Clinic >> Nov 20, 2023 10:35 AM Chuck Crater wrote: Reason for CRM: Patient is needing orders put in for a repeat TSH free t4 per Dr. Ethel Henry. She is also wanting to schedule an appointment for repeat lab.  *Please refer to Dr. Ethel Henry note on patient test result

## 2023-11-21 ENCOUNTER — Other Ambulatory Visit: Payer: Self-pay

## 2023-11-21 DIAGNOSIS — Z8349 Family history of other endocrine, nutritional and metabolic diseases: Secondary | ICD-10-CM

## 2023-11-21 DIAGNOSIS — Z79899 Other long term (current) drug therapy: Secondary | ICD-10-CM

## 2023-11-21 NOTE — Telephone Encounter (Signed)
 Spoke to pt. Order is placed and lab appt scheduled.

## 2023-11-23 ENCOUNTER — Other Ambulatory Visit (INDEPENDENT_AMBULATORY_CARE_PROVIDER_SITE_OTHER)

## 2023-11-23 DIAGNOSIS — Z8349 Family history of other endocrine, nutritional and metabolic diseases: Secondary | ICD-10-CM

## 2023-11-23 DIAGNOSIS — Z79899 Other long term (current) drug therapy: Secondary | ICD-10-CM | POA: Diagnosis not present

## 2023-11-23 LAB — T4, FREE: Free T4: 0.79 ng/dL (ref 0.60–1.60)

## 2023-11-23 LAB — TSH: TSH: 4.72 u[IU]/mL (ref 0.35–5.50)

## 2023-11-26 ENCOUNTER — Encounter: Payer: Self-pay | Admitting: Internal Medicine

## 2023-11-26 NOTE — Progress Notes (Signed)
 Results are  normal now .   No further action   regular follow

## 2023-11-28 ENCOUNTER — Encounter (HOSPITAL_COMMUNITY): Payer: Self-pay

## 2023-12-05 ENCOUNTER — Ambulatory Visit
Admission: RE | Admit: 2023-12-05 | Discharge: 2023-12-05 | Disposition: A | Discharge status: Home / Self Care | Source: Ambulatory Visit | Attending: Internal Medicine | Admitting: Internal Medicine

## 2023-12-05 DIAGNOSIS — Z1231 Encounter for screening mammogram for malignant neoplasm of breast: Secondary | ICD-10-CM

## 2024-04-18 ENCOUNTER — Other Ambulatory Visit: Payer: Self-pay | Admitting: Medical Genetics

## 2024-04-24 ENCOUNTER — Ambulatory Visit (INDEPENDENT_AMBULATORY_CARE_PROVIDER_SITE_OTHER): Admitting: Internal Medicine

## 2024-04-24 ENCOUNTER — Encounter: Payer: Self-pay | Admitting: Internal Medicine

## 2024-04-24 VITALS — BP 108/80 | HR 71 | Temp 97.9°F | Ht 64.76 in | Wt 106.0 lb

## 2024-04-24 DIAGNOSIS — Z79899 Other long term (current) drug therapy: Secondary | ICD-10-CM | POA: Diagnosis not present

## 2024-04-24 DIAGNOSIS — E785 Hyperlipidemia, unspecified: Secondary | ICD-10-CM | POA: Diagnosis not present

## 2024-04-24 DIAGNOSIS — Z0000 Encounter for general adult medical examination without abnormal findings: Secondary | ICD-10-CM | POA: Diagnosis not present

## 2024-04-24 DIAGNOSIS — Z23 Encounter for immunization: Secondary | ICD-10-CM

## 2024-04-24 DIAGNOSIS — G479 Sleep disorder, unspecified: Secondary | ICD-10-CM | POA: Diagnosis not present

## 2024-04-24 DIAGNOSIS — Z8616 Personal history of COVID-19: Secondary | ICD-10-CM

## 2024-04-24 LAB — COMPREHENSIVE METABOLIC PANEL WITH GFR
ALT: 11 U/L (ref 0–35)
AST: 18 U/L (ref 0–37)
Albumin: 4.8 g/dL (ref 3.5–5.2)
Alkaline Phosphatase: 55 U/L (ref 39–117)
BUN: 18 mg/dL (ref 6–23)
CO2: 29 meq/L (ref 19–32)
Calcium: 9.4 mg/dL (ref 8.4–10.5)
Chloride: 102 meq/L (ref 96–112)
Creatinine, Ser: 0.78 mg/dL (ref 0.40–1.20)
GFR: 80.71 mL/min (ref >60.00)
Glucose, Bld: 80 mg/dL (ref 70–99)
Potassium: 4.3 meq/L (ref 3.5–5.1)
Sodium: 140 meq/L (ref 135–145)
Total Bilirubin: 0.5 mg/dL (ref 0.2–1.2)
Total Protein: 7.3 g/dL (ref 6.0–8.3)

## 2024-04-24 LAB — CBC WITH DIFFERENTIAL/PLATELET
Basophils Absolute: 0 K/uL (ref 0.0–0.1)
Basophils Relative: 0.4 % (ref 0.0–3.0)
Eosinophils Absolute: 0.1 K/uL (ref 0.0–0.7)
Eosinophils Relative: 1.4 % (ref 0.0–5.0)
HCT: 37.4 % (ref 36.0–46.0)
Hemoglobin: 12.5 g/dL (ref 12.0–15.0)
Lymphocytes Relative: 24.1 % (ref 12.0–46.0)
Lymphs Abs: 1.2 K/uL (ref 0.7–4.0)
MCHC: 33.4 g/dL (ref 30.0–36.0)
MCV: 94 fl (ref 78.0–100.0)
Monocytes Absolute: 0.4 K/uL (ref 0.1–1.0)
Monocytes Relative: 7.6 % (ref 3.0–12.0)
Neutro Abs: 3.2 K/uL (ref 1.4–7.7)
Neutrophils Relative %: 66.5 % (ref 43.0–77.0)
Platelets: 211 K/uL (ref 150.0–400.0)
RBC: 3.98 Mil/uL (ref 3.87–5.11)
RDW: 13.7 % (ref 11.5–15.5)
WBC: 4.8 K/uL (ref 4.0–10.5)

## 2024-04-24 LAB — LIPID PANEL
Cholesterol: 202 mg/dL — ABNORMAL HIGH (ref 0–200)
HDL: 69.6 mg/dL (ref >39.00)
LDL Cholesterol: 111 mg/dL — ABNORMAL HIGH (ref 0–99)
NonHDL: 132.76
Total CHOL/HDL Ratio: 3
Triglycerides: 111 mg/dL (ref 0.0–149.0)
VLDL: 22.2 mg/dL (ref 0.0–40.0)

## 2024-04-24 LAB — TSH: TSH: 4.04 u[IU]/mL (ref 0.35–5.50)

## 2024-04-24 NOTE — Progress Notes (Unsigned)
 Chief Complaint  Patient presents with   Annual Exam    Pt is not fasted. Would like to discuss on gabapentin  and sleep.     HPI: Patient  Jessica Chan  63 y.o. comes in today for Preventive Health Care visit   Covid infection in  early September    took a while  lots of  mucous .  And lost weight . But doing better  Was weaning gabapentin   which has helped sleep  since was aware of list of meds poss assoc with cognitive decline . Durine covid had burning ant thigh pain so went back on and helped . Better now.  Rare use of ambien  and alprazolam    Health Maintenance  Topic Date Due   Pneumococcal Vaccine: 50+ Years (1 of 1 - PCV) Never done   Influenza Vaccine  02/22/2024   COVID-19 Vaccine (3 - Moderna risk series) 05/10/2024 (Originally 07/03/2020)   HIV Screening  04/24/2025 (Originally 09/05/1975)   Cervical Cancer Screening (HPV/Pap Cotest)  11/15/2025   Mammogram  12/04/2025   DTaP/Tdap/Td (3 - Td or Tdap) 10/07/2027   Colonoscopy  05/11/2029   Hepatitis C Screening  Completed   Zoster Vaccines- Shingrix  Completed   Hepatitis B Vaccines 19-59 Average Risk  Aged Out   HPV VACCINES  Aged Out   Meningococcal B Vaccine  Aged Out   Health Maintenance Review LIFESTYLE:  Exercise:   active when not sick  Tobacco/ETS: n Alcohol:  Sugar beverages: Sleep: ok on gaba see above Drug use: no HH of 3    ROS:  GEN/ HEENT: No fever, significant weight changes sweats headaches vision problems hearing changes, CV/ PULM; No chest pain shortness of breath cough, syncope,edema  change in exercise tolerance. GI /GU: No adominal pain, vomiting, change in bowel habits. No blood in the stool. No significant GU symptoms. SKIN/HEME: ,no acute skin rashes suspicious lesions or bleeding. No lymphadenopathy, nodules, masses.  NEURO/ PSYCH:  No neurologic signs such as weakness numbness. No depression anxiety. IMM/ Allergy: No unusual infections.  Allergy .   REST of 12 system review  negative except as per HPI   Past Medical History:  Diagnosis Date   Allergic rhinitis    Allergy    Anxiety    Anxiety disorder    BLEPHARITIS 05/27/2007   Qualifier: Diagnosis of  By: Laurice, CMA (AAMA), Clotilda S    Depression    Pt. denies   Diverticulitis    Diverticulitis of colon - recurrent 10/02/2013   Diverticulosis    ESOPHAGEAL STRICTURE 11/15/2009   Qualifier: Diagnosis of  By: Ever PA-c, Amy S    GERD (gastroesophageal reflux disease)    Hemangioma of liver    incidental finding-large, right and left lobes of liver    Hiatal hernia    IBS (irritable bowel syndrome)    PONV (postoperative nausea and vomiting)    SEVERE NAUSEA & VOMITING   Sinusitis, acute maxillary 07/29/2012   right  hx of sinus surgery  fu if needed recurrecne     Wears glasses 02/02/2021    Past Surgical History:  Procedure Laterality Date   adnoidectomy     COLONOSCOPY     DILATATION & CURETTAGE/HYSTEROSCOPY WITH MYOSURE N/A 05/23/2016   Procedure: DILATATION & CURETTAGE/HYSTEROSCOPY WITH MYOSURE;  Surgeon: Curlee VEAR Guan, MD;  Location: [facility];  Service: Gynecology;  Laterality: N/A;  Request to post this for 12:30pm. That will leave room for a second major case for us .  If we do not get a 2nd case in the next week I will move it up to follow. Thanks!  Needs one hour.  REQUEST FOR TINA RN AND AUDREY CST   endoscopy with dialation     KNEE ARTHROSCOPY WITH MEDIAL MENISECTOMY Right 02/08/2021   Procedure: KNEE ARTHROSCOPY WITH MEDIAL AND LATERAL MENISECTOMIES;  Surgeon: Josefina Chew, MD;  Location: [facility];  Service: Orthopedics;  Laterality: Right;   NASAL SINUS SURGERY     PROCTOSCOPY N/A 07/02/2014   Procedure: PROCTOSCOPY;  Surgeon: Elspeth Schultze, MD;  Location: [facility];  Service: General;  Laterality: N/A;   ROBOT ASSISTED LAPAROSCOPIC PARTIAL COLECTOMY     TONSILLECTOMY     ulnar nerve right arm     UPPER GASTROINTESTINAL ENDOSCOPY      Family History   Problem Relation Age of Onset   Lung cancer Mother    Autism Son    Colon cancer Maternal Uncle    Heart disease Father    Lung cancer Father    Breast cancer Maternal Grandmother    Esophageal cancer Neg Hx    Rectal cancer Neg Hx    Stomach cancer Neg Hx     Social History   Socioeconomic History   Marital status: Married    Spouse name: Not on file   Number of children: 2   Years of education: Not on file   Highest education level: Bachelor's degree (e.g., BA, AB, BS)  Occupational History   Occupation: Conservator, museum/gallery: UNEMPLOYED  Tobacco Use   Smoking status: Never   Smokeless tobacco: Never  Vaping Use   Vaping status: Never Used  Substance and Sexual Activity   Alcohol use: Yes    Comment: Social Drinker   Drug use: No   Sexual activity: Yes    Partners: Male    Birth control/protection: Post-menopausal    Comment: 1st intercourse- 17, partners- >5  Other Topics Concern   Not on file  Social History Narrative   Married HHof 4 one sone autistic   Husband had prostate cancer in last few years   Some exercise  Runs    No tobacco   Social Drivers of Corporate investment banker Strain: Low Risk  (04/20/2024)   Overall Financial Resource Strain (CARDIA)    Difficulty of Paying Living Expenses: Not hard at all  Food Insecurity: No Food Insecurity (04/20/2024)   Hunger Vital Sign    Worried About Running Out of Food in the Last Year: Never true    Ran Out of Food in the Last Year: Never true  Transportation Needs: No Transportation Needs (04/20/2024)   PRAPARE - Administrator, Civil Service (Medical): No    Lack of Transportation (Non-Medical): No  Physical Activity: Sufficiently Active (04/20/2024)   Exercise Vital Sign    Days of Exercise per Week: 4 days    Minutes of Exercise per Session: 40 min  Stress: No Stress Concern Present (04/20/2024)   [employer] of Occupational Health - Occupational Stress Questionnaire    Feeling of  Stress: Only a little  Social Connections: Unknown (04/20/2024)   Social Connection and Isolation Panel    Frequency of Communication with Friends and Family: Three times a week    Frequency of Social Gatherings with Friends and Family: Once a week    Attends Religious Services: Not on file    Active Member of Clubs or Organizations: No    Attends Club or  Organization Meetings: Not on file    Marital Status: Married    Outpatient Medications Prior to Visit  Medication Sig Dispense Refill   ALPRAZolam  (XANAX ) 0.5 MG tablet Take 1-2 tablets (0.5-1 mg total) by mouth 2 (two) times daily as needed for anxiety. 20 tablet 0   citalopram  (CELEXA ) 20 MG tablet TAKE 1 TABLET BY MOUTH TWICE A DAY 60 tablet 11   gabapentin  (NEURONTIN ) 100 MG capsule TAKE 5 CAPSULES(500 MG) BY MOUTH AT BEDTIME 150 capsule 11   MELATONIN PO Take by mouth. gummy     Multiple Vitamin (MULTIVITAMIN) tablet Take 1 tablet by mouth every morning.      tretinoin (RETIN-A) 0.025 % cream apply to affected area at bedtime TO DRY SKIN FOR ACNE  0   zolpidem  (AMBIEN ) 10 MG tablet TAKE 1 TABLET BY MOUTH AT BEDTIME AS NEEDED FOR SLEEP 30 tablet 3   Omega-3 Fatty Acids (FISH OIL PO) Take 1 capsule by mouth daily. (Patient not taking: Reported on 04/24/2024)     No facility-administered medications prior to visit.     EXAM:  BP 108/80 (BP Location: Left Arm, Patient Position: Sitting, Cuff Size: Normal)   Pulse 71   Temp 97.9 F (36.6 C) (Oral)   Ht 5' 4.76 (1.645 m)   Wt 106 lb (48.1 kg)   LMP 07/25/2011 Comment: sexually active  SpO2 99%   BMI 17.77 kg/m   Body mass index is 17.77 kg/m. Wt Readings from Last 3 Encounters:  04/24/24 106 lb (48.1 kg)  09/13/23 108 lb 6.4 oz (49.2 kg)  04/18/23 108 lb 3.2 oz (49.1 kg)    Physical Exam: Vital signs reviewed HZW:Uypd is a well-developed well-nourished alert cooperative    who appearsr stated age in no acute distress.  HEENT: normocephalic atraumatic , Eyes: PERRL  EOM's full, conjunctiva clear, Nares: paten,t no deformity discharge or tenderness., Ears: no deformity EAC's clear TMs with normal landmarks. Mouth: clear OP, no lesions, edema.  Moist mucous membranes. Dentition in adequate repair. NECK: supple without masses, thyromegaly or bruits. CHEST/PULM:  Clear to auscultation and percussion breath sounds equal no wheeze , rales or rhonchi. No chest wall deformities or tenderness. Breast: normal by inspection . No dimpling, discharge, masses, tenderness or discharge . CV: PMI is nondisplaced, S1 S2 no gallops, murmurs, rubs. Peripheral pulses are full without delay.No JVD .  ABDOMEN: Bowel sounds normal nontender  No guard or rebound, no hepato splenomegal no CVA tenderness.   Extremtities:  No clubbing cyanosis or edema, no acute joint swelling or redness no focal atrophy NEURO:  Oriented x3, cranial nerves 3-12 appear to be intact, no obvious focal weakness,gait within normal limits no abnormal reflexes or asymmetrical SKIN: No acute rashes normal turgor, color, no bruising or petechiae. PSYCH: Oriented, good eye contact, no obvious depression anxiety, cognition and judgment appear normal. LN: no cervical axillary  adenopathy  Lab Results  Component Value Date   WBC 4.8 04/24/2024   HGB 12.5 04/24/2024   HCT 37.4 04/24/2024   PLT 211.0 04/24/2024   GLUCOSE 80 04/24/2024   CHOL 202 (H) 04/24/2024   TRIG 111.0 04/24/2024   HDL 69.60 04/24/2024   LDLCALC 111 (H) 04/24/2024   ALT 11 04/24/2024   AST 18 04/24/2024   NA 140 04/24/2024   K 4.3 04/24/2024   CL 102 04/24/2024   CREATININE 0.78 04/24/2024   BUN 18 04/24/2024   CO2 29 04/24/2024   TSH 4.04 04/24/2024   HGBA1C 5.1 12/25/2018  BP Readings from Last 3 Encounters:  04/24/24 108/80  09/13/23 110/80  08/02/23 122/73    Lab planreviewed with patient   ASSESSMENT AND PLAN:  Discussed the following assessment and plan:    ICD-10-CM   1. Visit for preventive health examination   Z00.00 CBC with Differential/Platelet    Comprehensive metabolic panel with GFR    Lipid panel    TSH    2. Medication management  Z79.899 CBC with Differential/Platelet    Comprehensive metabolic panel with GFR    Lipid panel    TSH    3. Disturbance in sleep behavior  G47.9     4. Influenza vaccine needed  Z23 Flu vaccine trivalent PF, 6mos and older(Flulaval,Afluria,Fluarix,Fluzone)    5. Hyperlipidemia, unspecified hyperlipidemia type  E78.5 CBC with Differential/Platelet    Comprehensive metabolic panel with GFR    Lipid panel    TSH   favorable profile    6. History of COVID-19  Z86.16 CBC with Differential/Platelet    Comprehensive metabolic panel with GFR    Lipid panel    TSH    Sleep has been an issues but  benefit of low dose gaba more than risk at this time. Can try lowest dose that helps   . ( Ambien  not a better track record)  Expect continued improved from left over  covid sx .  Exam is reassuring  Return in about 1 year (around 04/24/2025) for depending on results.  Patient Care Team: Sherrelle Prochazka, Apolinar POUR, MD as PCP - General Avram Lupita BRAVO, MD as Attending Physician (Gastroenterology) Lavoie, Marie-Lyne, MD as Consulting Physician (Obstetrics and Gynecology) Patient Instructions  Ok to try Mucinex to think the mucous  High protein  foods .  Ok to use low dose gabapentin   for sleep for now. Least amount needed.  NOt sure other meds would be effective and any better at this time. Reevaluate on a regular basis   Can get prevnar at 65 or as needed.  Flu shot today .   Lenzie Sandler K. Westlynn Fifer M.D.

## 2024-04-24 NOTE — Patient Instructions (Addendum)
 Ok to try Mucinex to think the mucous  High protein  foods .  Ok to use low dose gabapentin   for sleep for now. Least amount needed.  NOt sure other meds would be effective and any better at this time. Reevaluate on a regular basis   Can get prevnar at 65 or as needed.  Flu shot today .

## 2024-04-27 ENCOUNTER — Ambulatory Visit: Payer: Self-pay | Admitting: Internal Medicine

## 2024-04-27 NOTE — Progress Notes (Signed)
 Results all in range   cholesterol stable  low risk  The 10-year ASCVD risk score (Arnett DK, et al., 2019) is: 2.9%   Values used to calculate the score:     Age: 63 years     Clincally relevant sex: Female     Is Non-Hispanic African American: No     Diabetic: No     Tobacco smoker: No     Systolic Blood Pressure: 108 mmHg     Is BP treated: No     HDL Cholesterol: 69.6 mg/dL     Total Cholesterol: 202 mg/dL

## 2024-05-07 ENCOUNTER — Other Ambulatory Visit: Payer: Self-pay | Admitting: Internal Medicine

## 2024-05-29 ENCOUNTER — Other Ambulatory Visit: Payer: Self-pay | Admitting: Internal Medicine

## 2024-05-29 NOTE — Telephone Encounter (Unsigned)
 Copied from CRM [id number]. Topic: Clinical - Medication Refill >> May 29, 2024  2:55 PM Ashley R wrote: Medication:  citalopram  (CELEXA ) 20 MG tablet   Has the patient contacted their pharmacy? Yes, no refills/RX expired   This is the patient's preferred pharmacy:  [id number] [place], [place] - [address] [place] [address] Phone: [phone] Fax: [phone]  Is this the correct pharmacy for this prescription? Yes   Has the prescription been filled recently? Yes  Is the patient out of the medication? Yes  Has the patient been seen for an appointment in the last year OR does the patient have an upcoming appointment? Yes  Can we respond through MyChart? Yes  Agent: Please be advised that Rx refills may take up to 3 business days. We ask that you follow-up with your pharmacy.

## 2024-05-30 MED ORDER — CITALOPRAM HYDROBROMIDE 20 MG PO TABS: 20.0000 mg | ORAL_TABLET | Freq: Two times a day (BID) | ORAL | 11 refills | Status: DC

## 2024-06-05 DIAGNOSIS — H25813 Combined forms of age-related cataract, bilateral: Secondary | ICD-10-CM | POA: Diagnosis not present

## 2024-06-05 DIAGNOSIS — H0102B Squamous blepharitis left eye, upper and lower eyelids: Secondary | ICD-10-CM | POA: Diagnosis not present

## 2024-06-05 DIAGNOSIS — D3131 Benign neoplasm of right choroid: Secondary | ICD-10-CM | POA: Diagnosis not present

## 2024-06-05 DIAGNOSIS — H04123 Dry eye syndrome of bilateral lacrimal glands: Secondary | ICD-10-CM | POA: Diagnosis not present

## 2024-06-05 DIAGNOSIS — H40013 Open angle with borderline findings, low risk, bilateral: Secondary | ICD-10-CM | POA: Diagnosis not present

## 2024-06-05 DIAGNOSIS — L719 Rosacea, unspecified: Secondary | ICD-10-CM | POA: Diagnosis not present

## 2024-06-05 DIAGNOSIS — H0102A Squamous blepharitis right eye, upper and lower eyelids: Secondary | ICD-10-CM | POA: Diagnosis not present

## 2024-06-27 ENCOUNTER — Other Ambulatory Visit: Payer: Self-pay | Admitting: Internal Medicine

## 2024-07-01 NOTE — Telephone Encounter (Signed)
 Ok to do 90 days with 4 refills

## 2025-04-30 ENCOUNTER — Encounter: Admitting: Internal Medicine
